# Patient Record
Sex: Female | Born: 1935
Health system: Southern US, Community
[De-identification: ages and names within clinical notes are randomized; demographics above are authoritative.]

## PROBLEM LIST (undated history)

## (undated) DIAGNOSIS — I080 Rheumatic disorders of both mitral and aortic valves: Secondary | ICD-10-CM

## (undated) DIAGNOSIS — M8580 Other specified disorders of bone density and structure, unspecified site: Secondary | ICD-10-CM

## (undated) DIAGNOSIS — E785 Hyperlipidemia, unspecified: Secondary | ICD-10-CM

## (undated) DIAGNOSIS — I2510 Atherosclerotic heart disease of native coronary artery without angina pectoris: Secondary | ICD-10-CM

## (undated) DIAGNOSIS — I7300 Raynaud's syndrome without gangrene: Secondary | ICD-10-CM

## (undated) DIAGNOSIS — A0472 Enterocolitis due to Clostridium difficile, not specified as recurrent: Secondary | ICD-10-CM

## (undated) DIAGNOSIS — I340 Nonrheumatic mitral (valve) insufficiency: Secondary | ICD-10-CM

## (undated) DIAGNOSIS — Z1211 Encounter for screening for malignant neoplasm of colon: Secondary | ICD-10-CM

## (undated) DIAGNOSIS — M810 Age-related osteoporosis without current pathological fracture: Secondary | ICD-10-CM

## (undated) HISTORY — DX: Other specified disorders of bone density and structure, unspecified site: M85.80

## (undated) HISTORY — PX: CORONARY STENT PLACEMENT: SHX1402

## (undated) HISTORY — PX: BREAST EXCISIONAL BIOPSY: SUR124

## (undated) HISTORY — DX: Atherosclerotic heart disease of native coronary artery without angina pectoris: I25.10

## (undated) HISTORY — DX: Rheumatic disorders of both mitral and aortic valves: I08.0

## (undated) HISTORY — DX: Raynaud's syndrome without gangrene: I73.00

## (undated) HISTORY — DX: Hyperlipidemia, unspecified: E78.5

## (undated) HISTORY — DX: Enterocolitis due to Clostridium difficile, not specified as recurrent: A04.72

## (undated) HISTORY — DX: Encounter for screening for malignant neoplasm of colon: Z12.11

## (undated) HISTORY — DX: Age-related osteoporosis without current pathological fracture: M81.0

## (undated) HISTORY — DX: Nonrheumatic mitral (valve) insufficiency: I34.0

---

## 1997-11-09 ENCOUNTER — Other Ambulatory Visit: Admission: RE | Admit: 1997-11-09 | Discharge: 1997-11-09 | Payer: Self-pay | Admitting: *Deleted

## 1998-12-27 ENCOUNTER — Ambulatory Visit (HOSPITAL_COMMUNITY): Admission: RE | Admit: 1998-12-27 | Discharge: 1998-12-27 | Payer: Self-pay | Admitting: Emergency Medicine

## 1999-11-20 ENCOUNTER — Encounter: Admission: RE | Admit: 1999-11-20 | Discharge: 1999-11-20 | Payer: Self-pay | Admitting: Emergency Medicine

## 1999-11-20 ENCOUNTER — Encounter: Payer: Self-pay | Admitting: Emergency Medicine

## 2000-03-18 ENCOUNTER — Encounter (INDEPENDENT_AMBULATORY_CARE_PROVIDER_SITE_OTHER): Payer: Self-pay

## 2000-03-18 ENCOUNTER — Other Ambulatory Visit: Admission: RE | Admit: 2000-03-18 | Discharge: 2000-03-18 | Payer: Self-pay | Admitting: Gynecology

## 2000-09-11 ENCOUNTER — Other Ambulatory Visit: Admission: RE | Admit: 2000-09-11 | Discharge: 2000-09-11 | Payer: Self-pay | Admitting: Gynecology

## 2000-11-20 ENCOUNTER — Encounter: Payer: Self-pay | Admitting: Gynecology

## 2000-11-20 ENCOUNTER — Encounter: Admission: RE | Admit: 2000-11-20 | Discharge: 2000-11-20 | Payer: Self-pay | Admitting: Gynecology

## 2001-09-17 ENCOUNTER — Other Ambulatory Visit: Admission: RE | Admit: 2001-09-17 | Discharge: 2001-09-17 | Payer: Self-pay | Admitting: Gynecology

## 2001-11-26 ENCOUNTER — Encounter: Payer: Self-pay | Admitting: Gynecology

## 2001-11-26 ENCOUNTER — Encounter: Admission: RE | Admit: 2001-11-26 | Discharge: 2001-11-26 | Payer: Self-pay | Admitting: Gynecology

## 2002-10-17 ENCOUNTER — Other Ambulatory Visit: Admission: RE | Admit: 2002-10-17 | Discharge: 2002-10-17 | Payer: Self-pay | Admitting: Gynecology

## 2003-01-13 ENCOUNTER — Encounter: Payer: Self-pay | Admitting: Gynecology

## 2003-01-13 ENCOUNTER — Encounter: Admission: RE | Admit: 2003-01-13 | Discharge: 2003-01-13 | Payer: Self-pay | Admitting: Gynecology

## 2003-10-02 ENCOUNTER — Encounter: Payer: Self-pay | Admitting: Internal Medicine

## 2003-10-03 ENCOUNTER — Ambulatory Visit (HOSPITAL_COMMUNITY): Admission: RE | Admit: 2003-10-03 | Discharge: 2003-10-04 | Payer: Self-pay | Admitting: Cardiology

## 2003-10-24 ENCOUNTER — Other Ambulatory Visit: Admission: RE | Admit: 2003-10-24 | Discharge: 2003-10-24 | Payer: Self-pay | Admitting: Gynecology

## 2004-02-29 ENCOUNTER — Ambulatory Visit: Payer: Self-pay | Admitting: Internal Medicine

## 2004-04-02 ENCOUNTER — Ambulatory Visit: Payer: Self-pay | Admitting: Cardiology

## 2004-04-04 ENCOUNTER — Ambulatory Visit: Payer: Self-pay | Admitting: Cardiology

## 2004-07-11 ENCOUNTER — Ambulatory Visit: Payer: Self-pay | Admitting: Cardiology

## 2004-07-22 ENCOUNTER — Ambulatory Visit: Payer: Self-pay | Admitting: Cardiology

## 2004-09-06 ENCOUNTER — Ambulatory Visit: Payer: Self-pay | Admitting: Cardiology

## 2004-10-01 ENCOUNTER — Encounter: Admission: RE | Admit: 2004-10-01 | Discharge: 2004-10-01 | Payer: Self-pay | Admitting: Otolaryngology

## 2004-10-07 ENCOUNTER — Ambulatory Visit: Payer: Self-pay | Admitting: Cardiology

## 2004-10-14 ENCOUNTER — Encounter: Admission: RE | Admit: 2004-10-14 | Discharge: 2004-10-14 | Payer: Self-pay | Admitting: Gynecology

## 2004-10-24 ENCOUNTER — Encounter: Admission: RE | Admit: 2004-10-24 | Discharge: 2004-10-24 | Payer: Self-pay | Admitting: Gynecology

## 2004-10-24 ENCOUNTER — Other Ambulatory Visit: Admission: RE | Admit: 2004-10-24 | Discharge: 2004-10-24 | Payer: Self-pay | Admitting: Gynecology

## 2004-11-29 ENCOUNTER — Encounter: Admission: RE | Admit: 2004-11-29 | Discharge: 2004-11-29 | Payer: Self-pay | Admitting: Gynecology

## 2004-12-03 ENCOUNTER — Ambulatory Visit (HOSPITAL_BASED_OUTPATIENT_CLINIC_OR_DEPARTMENT_OTHER): Admission: RE | Admit: 2004-12-03 | Discharge: 2004-12-03 | Payer: Self-pay | Admitting: Gynecology

## 2004-12-03 ENCOUNTER — Encounter (INDEPENDENT_AMBULATORY_CARE_PROVIDER_SITE_OTHER): Payer: Self-pay | Admitting: *Deleted

## 2004-12-03 ENCOUNTER — Ambulatory Visit (HOSPITAL_COMMUNITY): Admission: RE | Admit: 2004-12-03 | Discharge: 2004-12-03 | Payer: Self-pay | Admitting: Gynecology

## 2004-12-13 ENCOUNTER — Ambulatory Visit: Payer: Self-pay | Admitting: Cardiology

## 2005-02-26 ENCOUNTER — Ambulatory Visit: Payer: Self-pay | Admitting: Internal Medicine

## 2005-03-21 ENCOUNTER — Ambulatory Visit: Payer: Self-pay | Admitting: Cardiology

## 2005-07-01 ENCOUNTER — Ambulatory Visit: Payer: Self-pay | Admitting: Cardiology

## 2005-08-28 ENCOUNTER — Ambulatory Visit: Payer: Self-pay | Admitting: Internal Medicine

## 2005-10-28 ENCOUNTER — Encounter: Admission: RE | Admit: 2005-10-28 | Discharge: 2005-10-28 | Payer: Self-pay | Admitting: Gynecology

## 2006-02-03 ENCOUNTER — Ambulatory Visit: Payer: Self-pay | Admitting: Internal Medicine

## 2006-06-16 ENCOUNTER — Ambulatory Visit: Payer: Self-pay | Admitting: Cardiology

## 2006-07-01 ENCOUNTER — Ambulatory Visit: Payer: Self-pay | Admitting: Cardiology

## 2006-07-01 ENCOUNTER — Encounter: Payer: Self-pay | Admitting: Cardiology

## 2006-07-01 ENCOUNTER — Ambulatory Visit: Payer: Self-pay

## 2006-07-01 LAB — CONVERTED CEMR LAB
ALT: 23 units/L (ref 0–40)
AST: 33 units/L (ref 0–37)
Albumin: 3.9 g/dL (ref 3.5–5.2)
Alkaline Phosphatase: 49 units/L (ref 39–117)
Bilirubin, Direct: 0.1 mg/dL (ref 0.0–0.3)
Cholesterol: 147 mg/dL (ref 0–200)
HDL: 83.1 mg/dL (ref >39.0)
LDL Cholesterol: 53 mg/dL (ref 0–99)
Total Bilirubin: 0.8 mg/dL (ref 0.3–1.2)
Total CHOL/HDL Ratio: 1.8
Total Protein: 7.1 g/dL (ref 6.0–8.3)
Triglycerides: 55 mg/dL (ref 0–149)
VLDL: 11 mg/dL (ref 0–40)

## 2006-11-03 ENCOUNTER — Encounter: Admission: RE | Admit: 2006-11-03 | Discharge: 2006-11-03 | Payer: Self-pay | Admitting: Gynecology

## 2006-11-10 ENCOUNTER — Other Ambulatory Visit: Admission: RE | Admit: 2006-11-10 | Discharge: 2006-11-10 | Payer: Self-pay | Admitting: Gynecology

## 2006-12-15 ENCOUNTER — Ambulatory Visit: Payer: Self-pay | Admitting: Cardiology

## 2006-12-24 DIAGNOSIS — I7300 Raynaud's syndrome without gangrene: Secondary | ICD-10-CM | POA: Insufficient documentation

## 2006-12-24 DIAGNOSIS — E785 Hyperlipidemia, unspecified: Secondary | ICD-10-CM | POA: Insufficient documentation

## 2006-12-24 HISTORY — DX: Raynaud's syndrome without gangrene: I73.00

## 2006-12-24 HISTORY — DX: Hyperlipidemia, unspecified: E78.5

## 2007-08-26 ENCOUNTER — Ambulatory Visit: Payer: Self-pay | Admitting: Cardiology

## 2007-09-14 ENCOUNTER — Ambulatory Visit: Payer: Self-pay | Admitting: Cardiology

## 2007-09-14 LAB — CONVERTED CEMR LAB
ALT: 20 units/L (ref 0–35)
AST: 32 units/L (ref 0–37)
Albumin: 3.8 g/dL (ref 3.5–5.2)
Alkaline Phosphatase: 44 units/L (ref 39–117)
Bilirubin, Direct: 0.1 mg/dL (ref 0.0–0.3)
Cholesterol: 141 mg/dL (ref 0–200)
HDL: 79.2 mg/dL (ref >39.0)
LDL Cholesterol: 50 mg/dL (ref 0–99)
Total Bilirubin: 0.9 mg/dL (ref 0.3–1.2)
Total CHOL/HDL Ratio: 1.8
Total Protein: 7 g/dL (ref 6.0–8.3)
Triglycerides: 59 mg/dL (ref 0–149)
VLDL: 12 mg/dL (ref 0–40)

## 2007-11-09 ENCOUNTER — Encounter: Admission: RE | Admit: 2007-11-09 | Discharge: 2007-11-09 | Payer: Self-pay | Admitting: Gynecology

## 2007-12-24 ENCOUNTER — Encounter: Payer: Self-pay | Admitting: Internal Medicine

## 2008-01-26 ENCOUNTER — Ambulatory Visit: Payer: Self-pay | Admitting: Internal Medicine

## 2008-02-10 ENCOUNTER — Ambulatory Visit: Payer: Self-pay | Admitting: Internal Medicine

## 2008-04-14 LAB — CONVERTED CEMR LAB: Pap Smear: NORMAL

## 2008-08-11 ENCOUNTER — Encounter: Payer: Self-pay | Admitting: Internal Medicine

## 2008-08-12 DIAGNOSIS — I2510 Atherosclerotic heart disease of native coronary artery without angina pectoris: Secondary | ICD-10-CM | POA: Insufficient documentation

## 2008-08-12 DIAGNOSIS — I080 Rheumatic disorders of both mitral and aortic valves: Secondary | ICD-10-CM

## 2008-08-12 HISTORY — DX: Rheumatic disorders of both mitral and aortic valves: I08.0

## 2008-08-22 ENCOUNTER — Ambulatory Visit: Payer: Self-pay | Admitting: Cardiology

## 2008-08-28 ENCOUNTER — Ambulatory Visit: Payer: Self-pay | Admitting: Cardiology

## 2008-08-29 LAB — CONVERTED CEMR LAB
ALT: 16 units/L (ref 0–35)
AST: 34 units/L (ref 0–37)
Albumin: 4.1 g/dL (ref 3.5–5.2)
Alkaline Phosphatase: 45 units/L (ref 39–117)
Bilirubin, Direct: 0 mg/dL (ref 0.0–0.3)
Cholesterol: 151 mg/dL (ref 0–200)
HDL: 93.2 mg/dL (ref >39.00)
LDL Cholesterol: 44 mg/dL (ref 0–99)
Total Bilirubin: 0.9 mg/dL (ref 0.3–1.2)
Total CHOL/HDL Ratio: 2
Total CK: 182 units/L — ABNORMAL HIGH (ref 7–177)
Total Protein: 7.3 g/dL (ref 6.0–8.3)
Triglycerides: 71 mg/dL (ref 0.0–149.0)
VLDL: 14.2 mg/dL (ref 0.0–40.0)

## 2008-08-31 ENCOUNTER — Ambulatory Visit: Payer: Self-pay | Admitting: Internal Medicine

## 2008-09-01 ENCOUNTER — Telehealth: Payer: Self-pay | Admitting: Cardiology

## 2008-09-20 ENCOUNTER — Ambulatory Visit: Payer: Self-pay | Admitting: Internal Medicine

## 2008-09-21 LAB — CONVERTED CEMR LAB
BUN: 15 mg/dL (ref 6–23)
Basophils Absolute: 0.1 10*3/uL (ref 0.0–0.1)
Basophils Relative: 0.6 % (ref 0.0–3.0)
CO2: 30 meq/L (ref 19–32)
Calcium: 9.8 mg/dL (ref 8.4–10.5)
Chloride: 110 meq/L (ref 96–112)
Creatinine, Ser: 0.8 mg/dL (ref 0.4–1.2)
Eosinophils Absolute: 0.1 10*3/uL (ref 0.0–0.7)
Eosinophils Relative: 1.3 % (ref 0.0–5.0)
GFR calc non Af Amer: 74.48 mL/min (ref >60)
Glucose, Bld: 84 mg/dL (ref 70–99)
HCT: 38.5 % (ref 36.0–46.0)
Hemoglobin: 13.3 g/dL (ref 12.0–15.0)
Lymphocytes Relative: 23.9 % (ref 12.0–46.0)
Lymphs Abs: 2.1 10*3/uL (ref 0.7–4.0)
MCHC: 34.6 g/dL (ref 30.0–36.0)
MCV: 90.7 fL (ref 78.0–100.0)
Monocytes Absolute: 0.7 10*3/uL (ref 0.1–1.0)
Monocytes Relative: 8 % (ref 3.0–12.0)
Neutro Abs: 5.6 10*3/uL (ref 1.4–7.7)
Neutrophils Relative %: 66.2 % (ref 43.0–77.0)
Platelets: 294 10*3/uL (ref 150.0–400.0)
Potassium: 4.1 meq/L (ref 3.5–5.1)
RBC: 4.25 M/uL (ref 3.87–5.11)
RDW: 12.6 % (ref 11.5–14.6)
Sodium: 144 meq/L (ref 135–145)
TSH: 1.23 microintl units/mL (ref 0.35–5.50)
Total CK: 75 units/L (ref 7–177)
WBC: 8.6 10*3/uL (ref 4.5–10.5)

## 2008-10-12 ENCOUNTER — Ambulatory Visit: Payer: Self-pay | Admitting: Internal Medicine

## 2008-11-16 ENCOUNTER — Encounter: Admission: RE | Admit: 2008-11-16 | Discharge: 2008-11-16 | Payer: Self-pay | Admitting: Gynecology

## 2009-02-06 ENCOUNTER — Ambulatory Visit: Payer: Self-pay | Admitting: Internal Medicine

## 2009-08-14 ENCOUNTER — Ambulatory Visit: Payer: Self-pay | Admitting: Cardiology

## 2009-08-28 LAB — CONVERTED CEMR LAB
ALT: 16 units/L (ref 0–35)
AST: 29 units/L (ref 0–37)
Albumin: 4 g/dL (ref 3.5–5.2)
Alkaline Phosphatase: 46 units/L (ref 39–117)
Bilirubin, Direct: 0.1 mg/dL (ref 0.0–0.3)
Cholesterol: 141 mg/dL (ref 0–200)
HDL: 76.1 mg/dL (ref >39.00)
LDL Cholesterol: 52 mg/dL (ref 0–99)
Total Bilirubin: 0.7 mg/dL (ref 0.3–1.2)
Total CHOL/HDL Ratio: 2
Total Protein: 7.1 g/dL (ref 6.0–8.3)
Triglycerides: 65 mg/dL (ref 0.0–149.0)
VLDL: 13 mg/dL (ref 0.0–40.0)

## 2009-11-21 ENCOUNTER — Encounter: Admission: RE | Admit: 2009-11-21 | Discharge: 2009-11-21 | Payer: Self-pay | Admitting: Gynecology

## 2010-01-02 ENCOUNTER — Encounter: Payer: Self-pay | Admitting: Internal Medicine

## 2010-01-04 ENCOUNTER — Ambulatory Visit: Payer: Self-pay | Admitting: Internal Medicine

## 2010-05-16 NOTE — Assessment & Plan Note (Signed)
  Nurse Visit  Flu Vaccine Consent Questions     Do you have a history of severe allergic reactions to this vaccine? no    Any prior history of allergic reactions to egg and/or gelatin? no    Do you have a sensitivity to the preservative Thimersol? no    Do you have a past history of Guillan-Barre Syndrome? no    Do you currently have an acute febrile illness? no    Have you ever had a severe reaction to latex? no    Vaccine information given and explained to patient? yes    Are you currently pregnant? no    Lot Number:[id number]   Site Given  Left Deltoid IM Romualdo Bolk, CMA  January 26, 2008 12:55 PM   Prior Medications: BACTROBAN 2 % OINT (MUPIROCIN) Apply 1 a small amount to affected area twice a day VYTORIN 10-40 MG TABS (EZETIMIBE-SIMVASTATIN) Take 1 tablet by mouth once a day VIVELLE-DOT 0.025 MG/24HR  PTTW (ESTRADIOL) change 2X weekly PROMETRIUM 200 MG  CAPS (PROGESTERONE MICRONIZED) 1st 12 days of every other month ACTONEL 35 MG  TABS (RISEDRONATE SODIUM) weekly MULTIVITAMINS   TABS (MULTIPLE VITAMIN) once daily ASPIRIN 81 MG  TBEC (ASPIRIN) once daily CALTRATE 600+D 600-400 MG-UNIT  TABS (CALCIUM CARBONATE-VITAMIN D)  Current Allergies: ! CODEINE SULFATE (CODEINE SULFATE)    Orders Added: 1)  Admin 1st Vaccine [90471] 2)  Flu Vaccine 103yrs + [place].Dew    ]

## 2010-05-16 NOTE — Assessment & Plan Note (Signed)
Summary: PT WAS NOT PUT ON SCHEDULE//SLM    Chief Complaint:  acute complaint.    Current Allergies: ! CODEINE SULFATE (CODEINE SULFATE)        Impression & Recommendations:  Problem # 1:  RAYNAUD'S DISEASE (ICD-443.0) discussed at length amlodipine side effects discussed  10 minutes total time  Complete Medication List: 1)  Bactroban 2 % Oint (Mupirocin) .... Apply 1 a small amount to affected area twice a day 2)  Vytorin 10-40 Mg Tabs (Ezetimibe-simvastatin) .... Take 1 tablet by mouth once a day 3)  Vivelle-dot 0.025 Mg/24hr Pttw (Estradiol) .... Change 2x weekly 4)  Prometrium 200 Mg Caps (Progesterone micronized) .Marland Kitchen.. 1st 12 days of every other month 5)  Actonel 35 Mg Tabs (Risedronate sodium) .... Weekly 6)  Multivitamins Tabs (Multiple vitamin) .... Once daily 7)  Aspirin 81 Mg Tbec (Aspirin) .... Once daily 8)  Caltrate 600+d 600-400 Mg-unit Tabs (Calcium carbonate-vitamin d) 9)  Amlodipine Besylate 5 Mg Tabs (Amlodipine besylate) .Marland Kitchen.. 1 by mouth every day    ]

## 2010-05-16 NOTE — Procedures (Signed)
Summary: Colonoscopy   Colonoscopy  Procedure date:  10/12/2008  Findings:      [facility]    COLONOSCOPY PROCEDURE REPORT  PATIENT:  Jennifer Donaldson, Jennifer Donaldson  MR#:  [id number] BIRTHDATE:   1932-06-19, 76 yrs. old   GENDER:   female  ENDOSCOPIST:   Wilhemina Bonito. Eda Keys, MD Referred by: Birdie Sons, M.D.  PROCEDURE DATE:  10/12/2008 PROCEDURE:  Average-risk screening colonoscopy ASA CLASS:   Class II INDICATIONS: Routine Risk Screening   MEDICATIONS:    Fentanyl 50 mcg IV, Versed 5 mg IV  DESCRIPTION OF PROCEDURE:   After the risks benefits and alternatives of the procedure were thoroughly explained, informed consent was obtained.  Digital rectal exam was performed and revealed no abnormalities.   The LB [id number] endoscope was introduced through the anus and advanced to the cecum, which was identified by both the appendix and ileocecal valve, without limitations. TIME TO CECUM = 3:50MIN The quality of the prep was excellent, using MoviPrep.  The instrument was then withdrawn (TIME = 6:33 MIN) as the colon was fully examined. <<PROCEDUREIMAGES>>        <<OLD IMAGES>>  FINDINGS:  A normal appearing cecum, ileocecal valve, and appendiceal orifice were identified. The ascending, hepatic flexure, transverse, splenic flexure, descending, sigmoid colon, and rectum appeared unremarkable.  No polyps or cancers were seen.   Retroflexed views in the rectum revealed no abnormalities.    The scope was then withdrawn from the patient and the procedure completed.  COMPLICATIONS:   None  ENDOSCOPIC IMPRESSION:  1) Normal colon  2) No polyps or cancers RECOMMENDATIONS:  1) Return to the care of your primary provider. GI follow up as needed   _______________________________ Wilhemina Bonito. Eda Keys, MD  CC: Lindley Magnus, MD;  The Patient

## 2010-05-16 NOTE — Assessment & Plan Note (Signed)
Summary: Jennifer Donaldson    Visit Type:  1 year follpow up  CC:  No cardiac complains.  History of Present Illness: Able to do yardwork without difficulty.    She is able to do most anything.  Her husband has NPH and CABG, so she has to do alot of things for him  She does not mow grass anymore.  She denies dyspnea.    Current Medications (verified): 1)  Vytorin 10-40 Mg Tabs (Ezetimibe-Simvastatin) .... Take 1 Tablet By Mouth Once A Day 2)  Vivelle-Dot 0.025 Mg/24hr  Pttw (Estradiol) .... Change 2x Weekly 3)  Prometrium 200 Mg  Caps (Progesterone Micronized) .Marland Kitchen.. 1st 12 Days of Every 4th Month 4)  Aspirin 81 Mg  Tbec (Aspirin) .... Once Daily 5)  Citracal Petites/vitamin D 200-250 Mg-Unit Tabs (Calcium Citrate-Vitamin D) .... Two Tablets By Mouth Twice A Day 6)  Amlodipine Besylate 5 Mg  Tabs (Amlodipine Besylate) .Marland Kitchen.. 1 By Mouth Every Day 7)  Vitamin D3 1000 Unit Tabs (Cholecalciferol) .... Take 1 Tablet By Mouth Once A Day 8)  Clobetasol Propionate 0.05 % Crea (Clobetasol Propionate) .... Twice A Week 9)  Estrace 0.1 Mg/gm Crea (Estradiol) .... Use Twice Weekly As Directed  Allergies: 1)  ! Codeine Sulfate (Codeine Sulfate)  Vital Signs:  Patient profile:   77 year old female Height:      59 inches Weight:      108 pounds BMI:     21.89 Pulse rate:   66 / minute Pulse rhythm:   regular Resp:     18 per minute BP sitting:   122 / 70  (left arm) Cuff size:   regular  Vitals Entered By: Vikki Ports (Aug 14, 2009 9:13 AM)  Physical Exam  General:  Well developed, well nourished, in no acute distress. Head:  normocephalic and atraumatic Eyes:  PERRLA/EOM intact; conjunctiva and lids normal. Lungs:  Clear bilaterally to auscultation and percussion. Heart:  Non-displaced PMI, chest non-tender; regular rate and rhythm, S1, S2.  No definite murmur appreciated.   Msk:  Back normal, normal gait. Muscle strength and tone normal. Extremities:  No clubbing or cyanosis.  No edema.     EKG  Procedure date:  08/14/2009  Findings:      NSR.  WNL.    Impression & Recommendations:  Problem # 1:  CAD (ICD-414.00)  very stable.  No chest pain or jaw pain, her original symptom.  Continue medication.   Her updated medication list for this problem includes:    Aspirin 81 Mg Tbec (Aspirin) ..... Once daily    Amlodipine Besylate 5 Mg Tabs (Amlodipine besylate) .Marland Kitchen... 1 by mouth every day  Her updated medication list for this problem includes:    Aspirin 81 Mg Tbec (Aspirin) ..... Once daily    Amlodipine Besylate 5 Mg Tabs (Amlodipine besylate) .Marland Kitchen... 1 by mouth every day  Orders: EKG w/ Interpretation (93000)  Problem # 2:  HYPERLIPIDEMIA (ICD-272.4)  lipid and liver profile Her updated medication list for this problem includes:    Vytorin 10-40 Mg Tabs (Ezetimibe-simvastatin) .Marland Kitchen... Take 1 tablet by mouth once a day  Her updated medication list for this problem includes:    Vytorin 10-40 Mg Tabs (Ezetimibe-simvastatin) .Marland Kitchen... Take 1 tablet by mouth once a day  Orders: TLB-Lipid Panel (80061-LIPID) TLB-Hepatic/Liver Function Pnl (80076-HEPATIC)  Problem # 3:  MITRAL REGURGITATION (ICD-396.3) Not much of a murmur.  Echo was not scheduled  today.  Doubt we need to do this at present.  No symptoms.    Patient Instructions: 1)  Your physician recommends that you schedule a follow-up appointment in: 1 year with Dr. Riley Kill 2)  Your physician recommends that you continue on your current medications as directed. Please refer to the Current Medication list given to you today. 3)  Your physician recommends that you have lab work today: lipid, Liver 4)  Your physician has requested that you have an echocardiogram.  Echocardiography is a painless test that uses sound waves to create images of your heart. It provides your doctor with information about the size and shape of your heart and how well your heart's chambers and valves are working.  This procedure takes  approximately one hour. There are no restrictions for this procedure. ECHO SAME DAY AS 1 YEAR FOLLOW UP

## 2010-05-16 NOTE — Assessment & Plan Note (Signed)
Summary: F1Y  Medications Added PROMETRIUM 200 MG  CAPS (PROGESTERONE MICRONIZED) 1st 12 days of every 4th month CITRACAL PETITES/VITAMIN D 200-250 MG-UNIT TABS (CALCIUM CITRATE-VITAMIN D) two tablets by mouth twice a day VITAMIN D3 1000 UNIT TABS (CHOLECALCIFEROL) Take 1 tablet by mouth once a day CLOBETASOL PROPIONATE 0.05 % CREA (CLOBETASOL PROPIONATE) twice a week        History of Present Illness: No chest pain.  Dr. Cato Mulligan prescribed amlodipine, and it has helped.  Does have arthritis in the fingers as well.  No other symptoms.    Current Medications (verified): 1)  Vytorin 10-40 Mg Tabs (Ezetimibe-Simvastatin) .... Take 1 Tablet By Mouth Once A Day 2)  Vivelle-Dot 0.025 Mg/24hr  Pttw (Estradiol) .... Change 2x Weekly 3)  Prometrium 200 Mg  Caps (Progesterone Micronized) .Marland Kitchen.. 1st 12 Days of Every 4th Month 4)  Aspirin 81 Mg  Tbec (Aspirin) .... Once Daily 5)  Citracal Petites/vitamin D 200-250 Mg-Unit Tabs (Calcium Citrate-Vitamin D) .... Two Tablets By Mouth Twice A Day 6)  Amlodipine Besylate 5 Mg  Tabs (Amlodipine Besylate) .Marland Kitchen.. 1 By Mouth Every Day 7)  Vitamin D3 1000 Unit Tabs (Cholecalciferol) .... Take 1 Tablet By Mouth Once A Day 8)  Clobetasol Propionate 0.05 % Crea (Clobetasol Propionate) .... Twice A Week  Allergies: 1)  ! Codeine Sulfate (Codeine Sulfate) d Vital Signs:  Patient profile:   77 year old female Height:      60 inches Weight:      111.75 pounds BMI:     21.90 Pulse rate:   76 / minute BP sitting:   120 / 64  (left arm)  Vitals Entered By: Julieta Gutting, RN, BSN (Aug 22, 2008 2:43 PM)  Physical Exam  General:  Well developed, well nourished, in no acute distress. Lungs:  Clear bilaterally to auscultation and percussion. Heart:  Non-displaced PMI, chest non-tender; regular rate and rhythm, S1, S2 without murmurs, rubs or gallops. Carotid upstroke normal, no bruit. Normal abdominal aortic size, no bruits. Femorals normal pulses, no bruits. Pedals  normal pulses. No edema, no varicosities. Extremities:  No edema.  Some stiffening of fingers.     EKG  Procedure date:  08/22/2008  Findings:      NSR.  WNL.  Impression & Recommendations:  Problem # 1:  CAD (ICD-414.00)  No significant symptoms.  Pt should take ASA for colonoscopy. r updated medication list for this problem includes:    Aspirin 81 Mg Tbec (Aspirin) ..... Once daily    Amlodipine Besylate 5 Mg Tabs (Amlodipine besylate) .Marland Kitchen... 1 by mouth every day  Orders: EKG w/ Interpretation (93000)  Problem # 2:  HYPERLIPIDEMIA (ICD-272.4) Recheck lipids and CPK.  Meds reviewed in detail, including Zetia studies.  LDL lower on Vytorin than Simva 40.  Options reviewed. Her updated medication list for this problem includes:    Vytorin 10-40 Mg Tabs (Ezetimibe-simvastatin) .Marland Kitchen... Take 1 tablet by mouth once a day  Problem # 3:  MITRAL REGURGITATION (ICD-396.3)  No change in murmur  Orders: EKG w/ Interpretation (93000)  Patient Instructions: 1)  Followup with Dr. Cato Mulligan, with discussion of arthritis and Raynauds. 2)  Your physician recommends that you return for a FASTING lipid profile: LIPID, LIVER, CPK (08/28/08 8:30-1:30 or 2:30-4:45) 3)  Your physician wants you to follow-up in:  1 year with echo. You will receive a reminder letter in the mail two months in advance. If you don't receive a letter, please call our office to schedule the follow-up  appointment.

## 2010-05-16 NOTE — Assessment & Plan Note (Signed)
Summary: flu shot/njr  Nurse Visit   Allergies: 1)  ! Codeine Sulfate (Codeine Sulfate)  Immunizations Administered:  Influenza Vaccine # 1:    Vaccine Type: Fluvax MCR    Site: left deltoid    Mfr: [employer]    Dose: 0.5 ml    Route: IM    Given by: Kathrynn Speed CMA    Exp. Date: 10/12/2010    Lot #: [id number]    VIS given: 11/06/09 version given January 04, 2010.  Flu Vaccine Consent Questions:    Do you have a history of severe allergic reactions to this vaccine? no    Any prior history of allergic reactions to egg and/or gelatin? no    Do you have a sensitivity to the preservative Thimersol? no    Do you have a past history of Guillan-Barre Syndrome? no    Do you currently have an acute febrile illness? no    Have you ever had a severe reaction to latex? no    Vaccine information given and explained to patient? yes    Are you currently pregnant? no  Orders Added: 1)  Influenza Vaccine MCR [00025]

## 2010-05-16 NOTE — Progress Notes (Signed)
Summary: pt would like test results   Phone Note Call from Patient Call back at Home Phone [phone]   Reason for Call: Talk to Nurse, Lab or Test Results Summary of Call: pt has tried to get lab results all week she will be home for next couple of hours can you pls call her back Initial call taken by: Omer Jack,  Sep 01, 2008 11:27 AM  Follow-up for Phone Call        talked with patient -gave patient lab results from 08/28/08 -pt has appt September 20 2008 with Dr Cato Mulligan

## 2010-05-16 NOTE — Consult Note (Signed)
Summary: Gynecology-Dr. Nicholas Lose  Gynecology-Dr. Lomax   Imported By: Maryln Gottron 01/04/2008 14:17:06  _____________________________________________________________________  External Attachment:    Type:   Image     Comment:   External Document

## 2010-05-16 NOTE — Assessment & Plan Note (Signed)
Summary: flu shot/cjr  Nurse Visit Flu Vaccine Consent Questions     Do you have a history of severe allergic reactions to this vaccine? no    Any prior history of allergic reactions to egg and/or gelatin? no    Do you have a sensitivity to the preservative Thimersol? no    Do you have a past history of Guillan-Barre Syndrome? no    Do you currently have an acute febrile illness? no    Have you ever had a severe reaction to latex? no    Vaccine information given and explained to patient? yes    Are you currently pregnant? no    Lot Number:[id number]   Exp Date:10/11/2009   Site Given  Left Deltoid IM   Allergies: 1)  ! Codeine Sulfate (Codeine Sulfate)  Orders Added: 1)  Admin 1st Vaccine [90471] 2)  Flu Vaccine 32yrs + [09811]

## 2010-05-16 NOTE — Assessment & Plan Note (Signed)
    Chief Complaint:  raynoud's.  History of Present Illness: Raynaud's worsening-- pt comes in with husband she asks to be seen worsening raynaud's with pain  Past Medical History: Hyperlipidemia, hx of Osteopenia Raynauds Past Surgical History: no other complaints in a complete ROS     Current Allergies: ! CODEINE SULFATE (CODEINE SULFATE)   Social History:    Married    Never Smoked   Risk Factors:  Tobacco use:  never    Physical Exam  General:     Well-developed,well-nourished,in no acute distress; alert,appropriate and cooperative throughout examination Head:     normocephalic and atraumatic.   Neck:     No deformities, masses, or tenderness noted. Lungs:     Normal respiratory effort, chest expands symmetrically. Lungs are clear to auscultation, no crackles or wheezes. Heart:     Normal rate and regular rhythm. S1 and S2 normal without gallop, murmur, click, rub or other extra sounds.    Impression & Recommendations:  Problem # 1:  RAYNAUD'S DISEASE (ICD-443.0) discussed at length trial norvasc  side effects discussed  Complete Medication List: 1)  Bactroban 2 % Oint (Mupirocin) .... Apply 1 a small amount to affected area twice a day 2)  Vytorin 10-40 Mg Tabs (Ezetimibe-simvastatin) .... Take 1 tablet by mouth once a day 3)  Vivelle-dot 0.025 Mg/24hr Pttw (Estradiol) .... Change 2x weekly 4)  Prometrium 200 Mg Caps (Progesterone micronized) .Marland Kitchen.. 1st 12 days of every other month 5)  Actonel 35 Mg Tabs (Risedronate sodium) .... Weekly 6)  Multivitamins Tabs (Multiple vitamin) .... Once daily 7)  Aspirin 81 Mg Tbec (Aspirin) .... Once daily 8)  Caltrate 600+d 600-400 Mg-unit Tabs (Calcium carbonate-vitamin d) 9)  Amlodipine Besylate 5 Mg Tabs (Amlodipine besylate) .Marland Kitchen.. 1 by mouth every day    Prescriptions: AMLODIPINE BESYLATE 5 MG  TABS (AMLODIPINE BESYLATE) 1 by mouth every day  #90 x 3   Entered and Authorized by:   Birdie Sons MD  Signed by:   Birdie Sons MD on 02/10/2008   Method used:   Electronically to        [facility] [address] [phone]* (retail)       [address]       [place], [place]  [address]       Ph: [phone] or [phone]       Fax: [phone]   RxID:   [id number]  ]

## 2010-05-16 NOTE — Assessment & Plan Note (Signed)
Summary: CPX/WILL FAST FOR LABS//SLM   Vital Signs:  Patient profile:   77 year old female Height:      59 inches Weight:      108 pounds BMI:     21.89 Pulse rate:   72 / minute Pulse rhythm:   regular Resp:     12 per minute BP sitting:   110 / 72  (left arm)  Vitals Entered By: Gladis Riffle, RN (September 20, 2008 10:03 AM)  History of Present Illness: cpx  Current Problems (verified): 1)  Encounter For Long-term Use of Other Medications  (ICD-V58.69) 2)  Mitral Regurgitation  (ICD-396.3) 3)  Cad  (ICD-414.00) 4)  Special Screening For Malignant Neoplasms Colon  (ICD-V76.51) 5)  Raynaud's Disease  (ICD-443.0) 6)  Osteopenia  (ICD-733.90) 7)  Hyperlipidemia  (ICD-272.4)  Current Medications (verified): 1)  Vytorin 10-40 Mg Tabs (Ezetimibe-Simvastatin) .... Take 1 Tablet By Mouth Once A Day 2)  Vivelle-Dot 0.025 Mg/24hr  Pttw (Estradiol) .... Change 2x Weekly 3)  Prometrium 200 Mg  Caps (Progesterone Micronized) .Marland Kitchen.. 1st 12 Days of Every 4th Month 4)  Aspirin 81 Mg  Tbec (Aspirin) .... Once Daily 5)  Citracal Petites/vitamin D 200-250 Mg-Unit Tabs (Calcium Citrate-Vitamin D) .... Two Tablets By Mouth Twice A Day 6)  Amlodipine Besylate 5 Mg  Tabs (Amlodipine Besylate) .Marland Kitchen.. 1 By Mouth Every Day 7)  Vitamin D3 1000 Unit Tabs (Cholecalciferol) .... Take 1 Tablet By Mouth Once A Day 8)  Clobetasol Propionate 0.05 % Crea (Clobetasol Propionate) .... Twice A Week 9)  Moviprep 100 Gm  Solr (Peg-Kcl-Nacl-Nasulf-Na Asc-C) .... As Per Prep Instructions. 10)  Estrace 0.1 Mg/gm Crea (Estradiol) .... Use Twice Weekly As Directed  Allergies: 1)  ! Codeine Sulfate (Codeine Sulfate)  Comments:  Nurse/Medical Assistant: cpx, fasting; has gyn--Dr Riley Kill requests labs and was to have sent you a note The patient's medications and allergies were reviewed with the patient and were updated in the Medication and Allergy Lists. Gladis Riffle, RN (September 20, 2008 10:08 AM)  Past History:  Past  Medical History: Last updated: 08/12/2008 Current Problems:  MITRAL REGURGITATION (ICD-396.3) CAD (ICD-414.00) SPECIAL SCREENING FOR MALIGNANT NEOPLASMS COLON (ICD-V76.51) RAYNAUD'S DISEASE (ICD-443.0) OSTEOPENIA (ICD-733.90) HYPERLIPIDEMIA (ICD-272.4)  Past Surgical History: Last updated: 08/12/2008  2 prior drug-eluting  stent placed in the LAD and right coronary artery  Family History: Last updated: 08/12/2008 noncontributory  Social History: Last updated: 02/10/2008 Married Never Smoked  Risk Factors: Smoking Status: never (02/10/2008)  Review of Systems       All other systems reviewed and were negative    Impression & Recommendations:  Problem # 1:  PREVENTIVE HEALTH CARE (ICD-V70.0)  Orders: UA Dipstick w/o Micro (automated)  (81003) Venipuncture (16109) TLB-BMP (Basic Metabolic Panel-BMET) (80048-METABOL) TLB-CBC Platelet - w/Differential (85025-CBCD) TLB-TSH (Thyroid Stimulating Hormone) (84443-TSH)  she has colonoscopy scheduled 10/12/08  Problem # 2:  CAD (ICD-414.00) no sxs  reviewed labs Her updated medication list for this problem includes:    Aspirin 81 Mg Tbec (Aspirin) ..... Once daily    Amlodipine Besylate 5 Mg Tabs (Amlodipine besylate) .Marland Kitchen... 1 by mouth every day  Labs Reviewed: Chol: 151 (08/28/2008)   HDL: 93.20 (08/28/2008)   LDL: 44 (08/28/2008)   TG: 71.0 (08/28/2008)  Problem # 3:  MITRAL REGURGITATION (ICD-396.3) mild and asymptomatic clinically Her updated medication list for this problem includes:    Aspirin 81 Mg Tbec (Aspirin) ..... Once daily  Problem # 4:  HYPERLIPIDEMIA (ICD-272.4)  Her updated  medication list for this problem includes:    Vytorin 10-40 Mg Tabs (Ezetimibe-simvastatin) .Marland Kitchen... Take 1 tablet by mouth once a day  Labs Reviewed: SGOT: 34 (08/28/2008)   SGPT: 16 (08/28/2008)   HDL:93.20 (08/28/2008), 79.2 (09/14/2007)  LDL:44 (08/28/2008), 50 (09/14/2007)  Chol:151 (08/28/2008), 141 (09/14/2007)  Trig:71.0  (08/28/2008), 59 (09/14/2007)  Orders: TLB-CK Total Only(Creatine Kinase/CPK) (82550-CK)  Complete Medication List: 1)  Vytorin 10-40 Mg Tabs (Ezetimibe-simvastatin) .... Take 1 tablet by mouth once a day 2)  Vivelle-dot 0.025 Mg/24hr Pttw (Estradiol) .... Change 2x weekly 3)  Prometrium 200 Mg Caps (Progesterone micronized) .Marland Kitchen.. 1st 12 days of every 4th month 4)  Aspirin 81 Mg Tbec (Aspirin) .... Once daily 5)  Citracal Petites/vitamin D 200-250 Mg-unit Tabs (Calcium citrate-vitamin d) .... Two tablets by mouth twice a day 6)  Amlodipine Besylate 5 Mg Tabs (Amlodipine besylate) .Marland Kitchen.. 1 by mouth every day 7)  Vitamin D3 1000 Unit Tabs (Cholecalciferol) .... Take 1 tablet by mouth once a day 8)  Clobetasol Propionate 0.05 % Crea (Clobetasol propionate) .... Twice a week 9)  Moviprep 100 Gm Solr (Peg-kcl-nacl-nasulf-na asc-c) .... As per prep instructions. 10)  Estrace 0.1 Mg/gm Crea (Estradiol) .... Use twice weekly as directed  Other Orders: Tetanus Toxoid w/Dx [phone]) Admin 1st Vaccine (52841)  Preventive Care Screening  Bone Density:    Date:  04/15/2007    Next Due:  04/2009    Results:  lomax std dev  Mammogram:    Date:  04/14/2008    Next Due:  04/2009    Results:  normal-pt's report   Pap Smear:    Date:  04/14/2008    Next Due:  04/2011    Results:  normal-pt's report   Last Tetanus Booster:    Date:  09/20/2008    Results:  Td    Preventive Care Screening  Bone Density:    Date:  04/15/2007    Next Due:  04/2009    Results:  lomax std dev  Mammogram:    Date:  04/14/2008    Next Due:  04/2009    Results:  normal-pt's report   Pap Smear:    Date:  04/14/2008    Next Due:  04/2011    Results:  normal-pt's report   Last Tetanus Booster:    Date:  09/20/2008    Results:  Td    Other Immunization History:    Zostavax # 1:  Zostavax (04/15/2007)  Tetanus/Td Vaccine    Vaccine Type: Td    Site: right deltoid    Mfr: Sanofi Pasteur    Dose:  0.5 ml    Route: IM    Given by: Gladis Riffle, RN    Exp. Date: 05/11/2010    Lot #: [id number] Physical Exam General Appearance: well developed, well nourished, no acute distress Eyes: conjunctiva and lids normal, PERRL, EOMI,  Ears, Nose, Mouth, Throat: TM clear, nares clear, oral exam WNL Neck: supple, no lymphadenopathy, no thyromegaly, no JVD Respiratory: clear to auscultation and percussion, respiratory effort normal Cardiovascular: regular rate and rhythm, S1-S2, no murmur, rub or gallop, no bruits, peripheral pulses normal and symmetric, no cyanosis, clubbing, edema or varicosities Chest: no scars, masses, tenderness; no asymmetry, skin changes, nipple discharge   Gastrointestinal: soft, non-tender; no hepatosplenomegaly, masses; active bowel sounds all quadrants, Lymphatic: no cervical, axillary or inguinal adenopathy Musculoskeletal: gait normal, muscle tone and strength WNL, no joint swelling, effusions, discoloration, crepitus  Skin: clear, good turgor, color WNL, no rashes, lesions, or  ulcerations Neurologic: normal mental status, normal reflexes, normal strength, sensation, and motion Psychiatric: alert; oriented to person, place and time Other Exam:   Appended Document: CPX/WILL FAST FOR LABS//SLM  Laboratory Results   Urine Tests    Routine Urinalysis   Color: yellow Appearance: Clear Glucose: negative   (Normal Range: Negative) Bilirubin: negative   (Normal Range: Negative) Ketone: 1+   (Normal Range: Negative) Spec. Gravity: 1.020   (Normal Range: 1.003-1.035) Blood: negative   (Normal Range: Negative) pH: 6.0   (Normal Range: 5.0-8.0) Protein: negative   (Normal Range: Negative) Urobilinogen: 0.2   (Normal Range: 0-1) Nitrite: negative   (Normal Range: Negative) Leukocyte Esterace: trace   (Normal Range: Negative)    Comments: Rita Ohara  September 20, 2008 12:48 PM

## 2010-05-16 NOTE — Letter (Signed)
Summary: Annual Exam-Dr. Beather Arbour  Annual Exam-Dr. Beather Arbour   Imported By: Maryln Gottron 01/17/2010 13:33:43  _____________________________________________________________________  External Attachment:    Type:   Image     Comment:   External Document

## 2010-05-16 NOTE — Miscellaneous (Signed)
Summary: [facility] Previsit/prep  Clinical Lists Changes  Medications: Added new medication of MOVIPREP 100 GM  SOLR (PEG-KCL-NACL-NASULF-NA ASC-C) As per prep instructions. - Signed Rx of MOVIPREP 100 GM  SOLR (PEG-KCL-NACL-NASULF-NA ASC-C) As per prep instructions.;  #1 x 0;  Signed;  Entered by: Wyona Almas RN;  Authorized by: Hilarie Fredrickson MD;  Method used: [facility] [address] [phone]*, [address] [place], [place], [place]  [address], Ph: [phone] or [phone], Fax: [phone] Observations: Added new observation of ALLERGY REV: Done (08/31/2008 8:41)    Prescriptions: MOVIPREP 100 GM  SOLR (PEG-KCL-NACL-NASULF-NA ASC-C) As per prep instructions.  #1 x 0   Entered by:   Wyona Almas RN   Authorized by:   Hilarie Fredrickson MD   Signed by:   Wyona Almas RN on 08/31/2008   Method used:   [facility]        [facility] [address] [phone]* (retail)       [address]       [place], [place]  [address]       Ph: [phone] or [phone]       Fax: [phone]   RxID:   [id number]

## 2010-05-19 ENCOUNTER — Other Ambulatory Visit: Payer: Self-pay | Admitting: Internal Medicine

## 2010-07-15 ENCOUNTER — Telehealth: Payer: Self-pay | Admitting: Cardiology

## 2010-07-15 DIAGNOSIS — E785 Hyperlipidemia, unspecified: Secondary | ICD-10-CM

## 2010-07-15 DIAGNOSIS — I2510 Atherosclerotic heart disease of native coronary artery without angina pectoris: Secondary | ICD-10-CM

## 2010-07-15 DIAGNOSIS — I080 Rheumatic disorders of both mitral and aortic valves: Secondary | ICD-10-CM

## 2010-07-15 NOTE — Telephone Encounter (Signed)
This pt is due for echocardiogram (MR 396.3), lipid and liver profile (414.00, 272.4) and OV with Dr Riley Kill in May 2012. The pt already has an appointment scheduled with Dr Riley Kill on 09/04/10. Order placed for Echo and lipid and liver profile. I will forward this message to [facility] to contact the pt with appointments.

## 2010-07-15 NOTE — Telephone Encounter (Signed)
Pt states the doctor wanted her to get a test done before her next appt. Pt does not know what kind but she know he wanted her to get one done.

## 2010-08-15 ENCOUNTER — Other Ambulatory Visit: Payer: Self-pay | Admitting: Cardiology

## 2010-08-27 NOTE — Assessment & Plan Note (Signed)
[facility]                            CARDIOLOGY OFFICE NOTE   Jennifer, Donaldson                MRN:          [id number]  DATE:12/15/2006                            DOB:          1933/02/12    Jennifer Donaldson is in for followup.  She really is doing well and her last LDL  was 55.  She denies any chest pain.  She has had 2 prior drug-eluting  stent placed in the LAD and right coronary artery and she is on a single  aspirin a day.  She has had no recurrent symptoms whatsoever.   MEDICATIONS:  1. Prometrium as directed.  2. Aspirin 81 mg daily.  3. Vaginal cream.  4. Vytorin 10/40.  5. Vivelle patch.  6. Citrucel.  7. Vitamin D.   PHYSICAL EXAMINATION:  She is very alert and oriented.  Blood pressure is 120/64 with a pulse of 70.  The lung fields are clear.  There is a minimal apical systolic murmur.  EXTREMITIES:  Reveal no edema.   IMPRESSION:  1. Coronary artery disease, status post coronary artery bypass graft      surgery.  2. Hypercholesterolemia, on lipid-lowering therapy with excellent      targets.  3. Mild mitral regurgitation by 2-D echocardiography.   PLAN:  1. Return to clinic in 6 months.  2. We will repeat the 2-D echo at that time.  3. She will continue on current medical regimen.  4. We have had an extensive discussion.     Arturo Morton. Riley Kill, MD, [facility]  Electronically Signed    TDS/MedQ  DD: 12/15/2006  DT: 12/16/2006  Job #: [id number]

## 2010-08-27 NOTE — Letter (Signed)
Aug 26, 2007    Bruce H. Swords, M.D.  [address] [place], [place] [address]   RE:  Jennifer, Donaldson  MRN:  [id number]  /  DOB:  10/13/1932   Dear Smitty Cords:   I had the pleasure of seeing Jennifer Donaldson in the office today in  follow-up.  She is now out nearly four years from undergoing  percutaneous stenting using drug-eluting stents in both her left  anterior descending and right coronary artery.  She has subsequently  discontinued Plavix, and done quite well on single-dose aspirin.  She  has not had any recurrent problems.  Her last LDL cholesterol was well  within the target range.  She feels well.  She has had some challenges  with her husband, who has NPH.   Her current medications include:  1. Prometrium as directed.  2. Aspirin 81 mg daily.  3. Vytorin 10/40 every night.  4. Vivelle patch 0.05 twice weekly.  5. Citrucel b.i.d.  6. Vitamin D.  7. She recently has been placed on amoxicillin.   On physical, the blood pressure is 114/64, the pulse is 69.  The lung fields are clear.  The cardiac rhythm is regular.  EXTREMITIES:  Reveal no edema.   Electrocardiogram demonstrates normal sinus rhythm and essentially  within normal limits.   In summary, this very nice lady has had successful percutaneous  stenting.  She has had no recurrent symptoms after having placement of  drug-eluting stents in 2 vessels.  She has hypercholesterolemia that is  well controlled.  She also has mild mitral regurgitation by 2-D  echocardiogram, but I have examined her carefully, and I cannot  appreciate a significant mitral regurgitation murmur.   We plan to see her back in follow-up of one year's time.  Should there  be any problems in the interim, please not hesitate to let us know.  She  did ask that we perform a lipid and liver profile.  We will obtain  these.  I will send them to your office.    Sincerely,      Arturo Morton. Riley Kill, MD, [facility]  Electronically  Signed    TDS/MedQ  DD: 08/26/2007  DT: 08/26/2007  Job #: [id number]

## 2010-08-29 ENCOUNTER — Other Ambulatory Visit (INDEPENDENT_AMBULATORY_CARE_PROVIDER_SITE_OTHER): Payer: MEDICARE | Admitting: *Deleted

## 2010-08-29 ENCOUNTER — Ambulatory Visit (HOSPITAL_COMMUNITY): Payer: MEDICARE | Attending: Cardiology | Admitting: Radiology

## 2010-08-29 DIAGNOSIS — I2510 Atherosclerotic heart disease of native coronary artery without angina pectoris: Secondary | ICD-10-CM

## 2010-08-29 DIAGNOSIS — I059 Rheumatic mitral valve disease, unspecified: Secondary | ICD-10-CM | POA: Insufficient documentation

## 2010-08-29 DIAGNOSIS — I080 Rheumatic disorders of both mitral and aortic valves: Secondary | ICD-10-CM

## 2010-08-29 DIAGNOSIS — I079 Rheumatic tricuspid valve disease, unspecified: Secondary | ICD-10-CM | POA: Insufficient documentation

## 2010-08-29 DIAGNOSIS — E785 Hyperlipidemia, unspecified: Secondary | ICD-10-CM

## 2010-08-29 LAB — LIPID PANEL
Cholesterol: 149 mg/dL (ref 0–200)
HDL: 88 mg/dL (ref >39.00)
LDL Cholesterol: 50 mg/dL (ref 0–99)
Total CHOL/HDL Ratio: 2
Triglycerides: 53 mg/dL (ref 0.0–149.0)
VLDL: 10.6 mg/dL (ref 0.0–40.0)

## 2010-08-29 LAB — HEPATIC FUNCTION PANEL
ALT: 19 U/L (ref 0–35)
AST: 32 U/L (ref 0–37)
Albumin: 3.9 g/dL (ref 3.5–5.2)
Alkaline Phosphatase: 45 U/L (ref 39–117)
Bilirubin, Direct: 0.1 mg/dL (ref 0.0–0.3)
Total Bilirubin: 0.6 mg/dL (ref 0.3–1.2)
Total Protein: 6.8 g/dL (ref 6.0–8.3)

## 2010-08-30 NOTE — Op Note (Signed)
Jennifer Donaldson, BROOKOVER         ACCOUNT NO.:  [id number]   MEDICAL RECORD NO.:  [id number]          PATIENT TYPE:  AMB   LOCATION:  [facility]                         FACILITY:  [facility]   PHYSICIAN:  Gretta Cool, M.D. DATE OF BIRTH:  Nov 08, 1932   DATE OF PROCEDURE:  12/03/2004  DATE OF DISCHARGE:                                 OPERATIVE REPORT   PREOPERATIVE DIAGNOSIS:  Abnormal uterine bleeding on hormone replacement  therapy.   POSTOPERATIVE DIAGNOSIS:  Uterine leiomyomata, submucosal, with abnormal  uterine bleeding.   PROCEDURE:  Hysteroscopy, resection of uterine leiomyomata and fundal wall,  and total endometrial resection for ablation plus VaporTrode.   SURGEON:  Gretta Cool, M.D.   ANESTHESIA:  MAC plus paracervical block.   DESCRIPTION OF PROCEDURE:  Under excellent general anesthesia with the  patient prepped and draped in lithotomy position in Winthrop stirrups, the  cervix was grasped with a single-toothed tenaculum and pulled down into  view. The cervix was then progressively dilated with a series of Pratt  dilators to accommodate the 7-mm resectoscope. The resectoscope was then  introduced and the endometrial cavity photographed. There was a very  thickened endometrial lining in all the areas. On the right fundal wall a  fibroid was identified and resected progressively until it was completely  enucleated from the capsule. The capsule base was then cauterized with the  loop. At this point the entire endometrial cavity was resected down 5 mm or  more into the myometrium so as to remove the endometrium. Once all of the  endometrium had been removed and there was no further evidence of gland  openings or remaining endometrium the VaporTrode electrode was applied and  the entire cavity treated with VaporTrode so as to eliminate any superficial  endometrium out in the myometrial tissue. At this point, once the entire  cavity was treated and the corneal areas treated  by touch technique, the  procedure was terminated without complication. At reduced pressure there was  no significant bleeding. The patient returned to the recovery room in  excellent condition.           ______________________________  Gretta Cool, M.D.     CWL/MEDQ  D:  12/03/2004  T:  12/03/2004  Job:  [id number]   cc:   Valetta Mole. Swords, M.D. [facility]

## 2010-08-30 NOTE — Cardiovascular Report (Signed)
NAME:  NHYLA, NAPPI                   ACCOUNT NO.:  [id number]   MEDICAL RECORD NO.:  [id number]                   PATIENT TYPE:  OIB   LOCATION:  6526                                 FACILITY:  [facility]   PHYSICIAN:  Charlies Constable, M.D. [facility]              DATE OF BIRTH:  March 22, 1933   DATE OF PROCEDURE:  10/03/2003  DATE OF DISCHARGE:  10/04/2003                              CARDIAC CATHETERIZATION   CLINICAL HISTORY:  Ms. Fouts is 77 years old and has a positive family  history for coronary heart disease with a brother who had a large heart  attack at a young age.  She recently developed exertional chest tightness  and had a Cardiolite scan ordered by Dr. Cato Mulligan which was positive.  She saw  Dr. Riley Kill in the office and he scheduled her for evaluation with  angiography.   PROCEDURE:  The procedure was performed via the right femoral artery using  arterial sheath and preformed coronary catheters. A front wall arterial  puncture was performed and Omnipaque contrast was used.  After completion of  the diagnostic study, we made a decision to proceed with two-vessel PCI.   The patient was given Angiomax bolus and infusion and 300 mg of Plavix.  We  initially approached the left anterior descending artery.  We used a Q-3.5 6  [place] guiding catheter with side holes and an Asahi soft wire.  We crossed  the lesion with the wire without difficulty.  We predilated with a 2.25 x 20-  mm Maverick performing three inflations up to 8 atmospheres for 30 seconds.  We then attempted to pass a 2.5 x 28-mm Cypher stent, but this was  unsuccessful.  We then put a second buddy wire which was an extra support  Floppy wire next to the Pemberwick wire.  We were able with some difficulty and  with deep throating the guiding catheter to pass the Cypher stent down into  position.  We deployed this initially with 9 atmospheres for 30 seconds to  avoid overexpanding the distal edge.  We then post dilated  with a 2.5 x 20-  mm Quantum Maverick performing three inflations up to 20 atmospheres for 20  seconds avoiding the distal edge.  Repeat diagnostic study was then  performed through the guiding catheter.   We then approached the right coronary artery. We used a JR-4  6 [place]  guiding catheter with side holes and the same Asahi soft wire.  We crossed  the lesion with the wire with some difficulty.  We initially were unable to  pass the Asahi wire across the lesion and we left that wire in place and  passed a PT-2 light support wire across the lesion and then removed the  Asahi wire.  We predilated with the same 2.5 x 20-mm Maverick. We then  deployed a 2.5 x 23-mm Cypher stent deploying this with one inflation of 14  atmospheres for 30 seconds.  We then post dilated with a 2.75 x 20-mm  Quantum Maverick performing three inflations up to 20 atmospheres for 30  seconds.  We then felt that the stent was not quite fully expanded in its  mid portion so we went in with a 3.0 x 20-mm Quantum Maverick performing one  inflation up to 20 atmospheres for 30 seconds.  Repeat diagnostics were then  performed through the guiding catheter.  The patient tolerated the procedure  well and left the laboratory in satisfactory condition.   RESULTS:  Left main coronary artery:  The left main coronary was free of  significant disease.   Left anterior descending artery:  The left anterior descending artery gave  rise to two diagonal branches and a septal perforator.  There was segmental  disease in the proximal to mid vessel with focal area of 95% stenosis.   Circumflex artery:  The circumflex artery gave rise to a marginal branch and  an AV branch which terminated in the posterior lateral branch.  These  vessels were free of significant disease.   Right coronary artery:  The right coronary artery was a moderately large  vessel that gave rise to a right ventricular branch, posterior descending  branch, and  a posterior lateral branch.  There was 95% stenosis in the mid  portion of the right coronary artery.   LEFT VENTRICULOGRAM:  The left ventriculogram performed in the RAO  projection showed hypokinesis of the inferobasal segment.  The overall wall  motion was good with an estimated ejection fraction of 60%.   Following stenting of the lesion in the LAD, the stenosis improved from 95%  to less than 10%.   Following stenting in the lesion in the mid right coronary artery, the  stenosis improved from 95% to less than 10%.   The aortic pressure was 148/73 with mean of 107.  Left ventricular pressure  was 148/26.   CONCLUSION:  1. Coronary artery disease with 95% stenosis in the proximal left anterior     descending artery, no significant obstruction of the circumflex artery,     95% stenosis in the mid right coronary artery inferobasal wall     hypokinesis.  Estimated ejection fraction of 60%.  2. Successful deployment of a Cypher stent in the proximal left anterior     descending with improvement in stent renarrowing from 95% to less than     10%.  3. Successful deployment of a Cypher stent in the mid right coronary artery     with improvement in stent renarrowing from 95% to less than 10%.   DISPOSITION:  The patient will return to postangioplasty unit for further  observation.                                               Charlies Constable, M.D. [facility]    BB/MEDQ  D:  10/03/2003  T:  10/05/2003  Job:  [id number]   cc:   Valetta Mole. Swords, M.D. [facility]   Maisie Fus D. Riley Kill, M.D. [facility]

## 2010-08-30 NOTE — Discharge Summary (Signed)
NAME:  Jennifer Donaldson, Jennifer Donaldson                   ACCOUNT NO.:  [id number]   MEDICAL RECORD NO.:  [id number]                   PATIENT TYPE:  OIB   LOCATION:  6526                                 FACILITY:  [facility]   PHYSICIAN:  Arturo Morton. Riley Kill, M.D. [facility]         DATE OF BIRTH:  1933/04/09   DATE OF ADMISSION:  10/03/2003  DATE OF DISCHARGE:                           DISCHARGE SUMMARY - REFERRING   PROCEDURES:  1. Coronary angiogram.  2. Stenting left anterior descending and right coronary artery, June 21.   REASON FOR ADMISSION:  The patient is a 77 year old female with no prior  history of heart disease, who recently presented with exertional chest pain.  She was referred for exercise stress Cardiolite which revealed inferior  ischemia.  The patient presented for elective cardiac catheterization.   LABORATORY DATA:  CBC normal on admission.  INR 0.9, electrolytes and renal  function normal.  CPK 97/ 8.1 (post-PCI).   ADMISSION CHEST X-RAY:  No active disease.   HOSPITAL COURSE:  The patient presented for elective cardiac  catheterization, performed by Dr. Charlies Constable.  See report for full  details, revealing significant two-vessel coronary artery disease with a 95%  proximal LAD and a 90% mid RCA.  Circumflex artery was normal.  Left  ventricular function was normal (EF of 60%) with inferobasal hypokinesis.   Dr. Juanda Chance proceeded with successful stenting (CYPHER) above the LAD and RCA  lesions, both to less than 10% residual stenosis.  There were no noted  complications.  The patient was kept for overnight observation.   The patient was cleared for discharge the following morning, in  hemodynamically stable condition.  There was no complaint of chest pain, and  no noted complications of the right coronary incision site.   FINAL RECOMMENDATIONS:  At time of discharge:  Treatment with Plavix for six  months, with concomitant overlap of full dose aspirin and down titration to  82  mg daily; addition of Zocor.   DISCHARGE MEDICATIONS:  1. Plavix mg daily (x6 months).  2. Enteric coated aspirin, 325 mg daily.  3. Zocor 20 mg q.h.s.  4. __________0.05 mg as previously directed.  5. Prometrium 200 mg as previously directed.  6. Actonel 35 mg q. Weekly.  7. Nitrostat 0.4 mg p.r.n.   DISCHARGE INSTRUCTIONS:  1. No heavy lifting/ driving x2 days.  2. Maintain low-fat, low-cholesterol diet.  3. Call the office if there is any swelling or bleeding of the groin.   FOLLOWUP:  The patient will follow up with Dr. Arturo Morton. Stuckey on July 11,  at 10:00 a.m.   DISCHARGE DIAGNOSES:  1. Two-vessel coronary artery disease.     A. Status post stent, (CYPHER) 95% proximal LAD and 95% mid to RCA on        June 21.     B. Normal left ventricular function.  2. Hypercholesterolemia.  3. Remote tobacco.      Gene Serpe, P.A. [facility]  Arturo Morton. Riley Kill, M.D. [facility]    GS/MEDQ  D:  10/04/2003  T:  10/05/2003  Job:  [id number]   cc:   Valetta Mole. Swords, M.D. [facility]

## 2010-08-30 NOTE — Assessment & Plan Note (Signed)
[facility]                            CARDIOLOGY OFFICE NOTE   BRIANAH, HOPSON                MRN:          [id number]  DATE:06/16/2006                            DOB:          1932-09-23    Jennifer Donaldson is in for followup. She has had a cold recently and  therefore has been a little fatigued. She actually is starting to feel a  little bit better. She had been under a huge amount of stress with her  husband however. He has had a number of things occur and has not been  feeling well.   She denies any chest pain or significant shortness of breath.   MEDICATIONS:  1. Prometrium as directed.  2. Aspirin 81 mg daily.  3. Clobetasol.  4. Vytorin 10/40 q nightly.  5. Vivelle patch.  6. Citrucel.  7. Vitamin D3.   PHYSICAL EXAMINATION:  She is alert, oriented female in no distress.  Blood pressure is 114/71, pulse is 90. The weight is 110 pounds. The  jugular veins are absolutely nondistended.  LUNGS:  Clear to auscultation and percussion.  There is no RV lift. The first and second heart sounds are normal. I do  not appreciate an increase in P2.  EXTREMITIES: Reveal no edema.   The electrocardiogram does reveal normal sinus rhythm as a rightward  oriented axis and fairly prominent P-waves in II, III and AVF which are  more prominent than the one year earlier.   IMPRESSION:  1. Coronary artery disease status post coronary artery stenting.  2. Hypercholesterolemia, on lipid-lowering therapy.  3. Slightly prominent P-waves by EKG.   PLAN:  1. Will do a 2D echocardiogram to followup to make sure she does not      have underlying pulmonary hypertension.  2. Lipid and liver profile will be obtained.  3. She will be seen in followup in one year.     Arturo Morton. Riley Kill, MD, [facility]  Electronically Signed    TDS/MedQ  DD: 06/16/2006  DT: 06/16/2006  Job #: [id number]   cc:   Valetta Mole. Swords, MD

## 2010-09-04 ENCOUNTER — Ambulatory Visit: Payer: Self-pay | Admitting: Cardiology

## 2010-09-04 ENCOUNTER — Telehealth: Payer: Self-pay | Admitting: Cardiology

## 2010-09-04 NOTE — Telephone Encounter (Signed)
Wants echo results °

## 2010-09-04 NOTE — Telephone Encounter (Signed)
Pt aware of echo and lab results.

## 2010-09-17 ENCOUNTER — Encounter: Payer: Self-pay | Admitting: Cardiology

## 2010-10-11 ENCOUNTER — Ambulatory Visit: Payer: Self-pay | Admitting: Cardiology

## 2010-10-21 ENCOUNTER — Other Ambulatory Visit: Payer: Self-pay | Admitting: Gynecology

## 2010-10-21 DIAGNOSIS — Z1231 Encounter for screening mammogram for malignant neoplasm of breast: Secondary | ICD-10-CM

## 2010-11-04 ENCOUNTER — Encounter: Payer: Self-pay | Admitting: Cardiology

## 2010-11-08 ENCOUNTER — Ambulatory Visit (INDEPENDENT_AMBULATORY_CARE_PROVIDER_SITE_OTHER): Payer: Medicare Other | Admitting: Cardiology

## 2010-11-08 ENCOUNTER — Encounter: Payer: Self-pay | Admitting: Cardiology

## 2010-11-08 VITALS — BP 109/59 | HR 73 | Resp 18 | Ht 60.0 in | Wt 111.0 lb

## 2010-11-08 DIAGNOSIS — I2510 Atherosclerotic heart disease of native coronary artery without angina pectoris: Secondary | ICD-10-CM

## 2010-11-08 NOTE — Assessment & Plan Note (Signed)
Recent echo done, and only trivial MR.  No audible murmur.  Will now follow prn and allow Dr. Cato Mulligan to determine if and when she needs to be seen again.

## 2010-11-08 NOTE — Assessment & Plan Note (Signed)
At target.  Excellent results on Vytorin which she has taken for a long time.  Anticipated results of IMPROVE IT discussed with patient. Long term decisions on drug therapy will likely reside on the results of this trial.  She likely would do well on simva alone, but she was in good shape prior to her PCI.

## 2010-11-08 NOTE — Progress Notes (Signed)
HPI:  Doing well.  No chest pain.  Feels good.  No complaints.  Tolerates meds well.  Sees Dr. Cato Mulligan for medical issues.  Lipids and echo results reviewed in detail with patient.    Current Outpatient Prescriptions  Medication Sig Dispense Refill  . aspirin 81 MG tablet Take 81 mg by mouth daily.        . Calcium Citrate-Vitamin D 200-250 MG-UNIT TABS Take 2 tablets by mouth 2 (two) times daily.        . Cholecalciferol (VITAMIN D3) 1000 UNITS CAPS Take 1 tablet by mouth daily.        . clobetasol (TEMOVATE) 0.05 % cream Apply topically 2 (two) times a week.        . estradiol (ESTRACE) 0.1 MG/GM vaginal cream Place vaginally 2 (two) times a week. As directed       . estradiol (VIVELLE-DOT) 0.025 MG/24HR Place 1 patch onto the skin 2 (two) times a week.        . Glucosamine-Chondroitin (GLUCOSAMINE CHONDR COMPLEX PO) Take 3 capsules by mouth daily.        . progesterone (PROMETRIUM) 200 MG capsule Take 200 mg by mouth. 1st 12 days of every 4th month       . VYTORIN 10-40 MG per tablet TAKE 1 TABLET BY MOUTH EVERY DAY  30 tablet  10    Allergies  Allergen Reactions  . Codeine Sulfate     REACTION: sensitivity to    Past Medical History  Diagnosis Date  . Mitral regurgitation   . CAD (coronary artery disease)   . Hyperlipidemia   . Raynaud's disease   . Osteopenia   . Special screening for malignant neoplasm of colon     Past Surgical History  Procedure Date  . Coronary stent placement     2 prior drug eluting stent placed in the LAD and right coronary artery     No family history on file.  History   Social History  . Marital Status: Married    Spouse Name: N/A    Number of Children: N/A  . Years of Education: N/A   Occupational History  . Not on file.   Social History Main Topics  . Smoking status: Never Smoker   . Smokeless tobacco: Not on file  . Alcohol Use: Not on file  . Drug Use: Not on file  . Sexually Active: Not on file   Other Topics Concern  . Not  on file   Social History Narrative  . No narrative on file    ROS: Please see the HPI.  All other systems reviewed and negative.  PHYSICAL EXAM:  BP 109/59  Pulse 73  Resp 18  Ht 5' (1.524 m)  Wt 111 lb (50.349 kg)  BMI 21.68 kg/m2  General: Well developed, well nourished, in no acute distress. Head:  Normocephalic and atraumatic. Neck: no JVD Lungs: Clear to auscultation and percussion. Heart: Normal S1 and S2.  No murmur, rubs or gallops.  Pulses: Pulses normal in all 4 extremities. Extremities: No clubbing or cyanosis. No edema. Neurologic: Alert and oriented x 3.  EKG:  NSR.  Possible LAE.  Otherwise normal.   ASSESSMENT AND PLAN:

## 2010-12-04 ENCOUNTER — Ambulatory Visit
Admission: RE | Admit: 2010-12-04 | Discharge: 2010-12-04 | Disposition: A | Discharge status: Home / Self Care | Payer: Medicare Other | Source: Ambulatory Visit | Attending: Gynecology | Admitting: Gynecology

## 2010-12-04 DIAGNOSIS — Z1231 Encounter for screening mammogram for malignant neoplasm of breast: Secondary | ICD-10-CM

## 2011-01-20 ENCOUNTER — Other Ambulatory Visit: Payer: Self-pay | Admitting: Gynecology

## 2011-01-22 ENCOUNTER — Ambulatory Visit (INDEPENDENT_AMBULATORY_CARE_PROVIDER_SITE_OTHER): Payer: Medicare Other | Admitting: Internal Medicine

## 2011-01-22 DIAGNOSIS — Z23 Encounter for immunization: Secondary | ICD-10-CM

## 2011-04-18 ENCOUNTER — Encounter: Payer: Medicare Other | Admitting: Internal Medicine

## 2011-05-07 ENCOUNTER — Ambulatory Visit (INDEPENDENT_AMBULATORY_CARE_PROVIDER_SITE_OTHER): Payer: Medicare Other | Admitting: Internal Medicine

## 2011-05-07 ENCOUNTER — Encounter: Payer: Self-pay | Admitting: Internal Medicine

## 2011-05-07 DIAGNOSIS — Z0000 Encounter for general adult medical examination without abnormal findings: Secondary | ICD-10-CM

## 2011-05-07 DIAGNOSIS — I2510 Atherosclerotic heart disease of native coronary artery without angina pectoris: Secondary | ICD-10-CM

## 2011-05-07 MED ORDER — AMLODIPINE BESYLATE 2.5 MG PO TABS: 2.5000 mg | ORAL_TABLET | Freq: Every day | ORAL | Status: DC

## 2011-05-07 MED ORDER — ATORVASTATIN CALCIUM 40 MG PO TABS: 40.0000 mg | ORAL_TABLET | Freq: Every day | ORAL | Status: DC

## 2011-05-07 NOTE — Assessment & Plan Note (Signed)
No sxs Continue risk factor modification 

## 2011-05-07 NOTE — Progress Notes (Signed)
Patient ID: Jennifer Donaldson, female   DOB: [birth year], 78 y.o.   MRN: [id number] cpx  Past Medical History  Diagnosis Date  . Mitral regurgitation   . CAD (coronary artery disease)   . Hyperlipidemia   . Raynaud's disease   . Osteopenia   . Special screening for malignant neoplasm of colon     History   Social History  . Marital Status: Married    Spouse Name: N/A    Number of Children: N/A  . Years of Education: N/A   Occupational History  . Not on file.   Social History Main Topics  . Smoking status: Never Smoker   . Smokeless tobacco: Not on file  . Alcohol Use: Not on file  . Drug Use: Not on file  . Sexually Active: Not on file   Other Topics Concern  . Not on file   Social History Narrative  . No narrative on file    Past Surgical History  Procedure Date  . Coronary stent placement     2 prior drug eluting stent placed in the LAD and right coronary artery     No family history on file.  Allergies  Allergen Reactions  . Codeine Sulfate     REACTION: sensitivity to    Current Outpatient Prescriptions on File Prior to Visit  Medication Sig Dispense Refill  . aspirin 81 MG tablet Take 81 mg by mouth daily.        . Cholecalciferol (VITAMIN D3) 1000 UNITS CAPS Take 1 tablet by mouth daily.        . clobetasol (TEMOVATE) 0.05 % cream Apply topically 2 (two) times a week.        . estradiol (ESTRACE) 0.1 MG/GM vaginal cream Place vaginally 2 (two) times a week. As directed       . estradiol (VIVELLE-DOT) 0.025 MG/24HR Place 1 patch onto the skin 2 (two) times a week.        . Glucosamine-Chondroitin (GLUCOSAMINE CHONDR COMPLEX PO) Take 3 capsules by mouth daily.        . progesterone (PROMETRIUM) 200 MG capsule Take 200 mg by mouth. 1st 12 days of every 4th month       . VYTORIN 10-40 MG per tablet TAKE 1 TABLET BY MOUTH EVERY DAY  30 tablet  10     patient denies chest pain, shortness of breath, orthopnea. Denies lower extremity edema, abdominal  pain, change in appetite, change in bowel movements. Patient denies rashes, musculoskeletal complaints. No other specific complaints in a complete review of systems.   BP 112/74  Pulse 76  Temp(Src) 98 F (36.7 C) (Oral)  Ht 4\' 11"  (1.499 m)  Wt 111 lb (50.349 kg)  BMI 22.42 kg/m2  Well-developed well-nourished female in no acute distress. HEENT exam atraumatic, normocephalic, extraocular muscles are intact. Neck is supple. No jugular venous distention no thyromegaly. Chest clear to auscultation without increased work of breathing. Cardiac exam S1 and S2 are regular. Abdominal exam active bowel sounds, soft, nontender. Extremities no edema. Neurologic exam she is alert without any motor sensory deficits. Gait is normal.  Well visit: health Maint UTD

## 2011-07-02 ENCOUNTER — Other Ambulatory Visit (INDEPENDENT_AMBULATORY_CARE_PROVIDER_SITE_OTHER): Payer: Medicare Other

## 2011-07-02 DIAGNOSIS — Z79899 Other long term (current) drug therapy: Secondary | ICD-10-CM

## 2011-07-02 DIAGNOSIS — I2510 Atherosclerotic heart disease of native coronary artery without angina pectoris: Secondary | ICD-10-CM

## 2011-07-02 LAB — BASIC METABOLIC PANEL WITH GFR
BUN: 19 mg/dL (ref 6–23)
CO2: 27 meq/L (ref 19–32)
Calcium: 9.3 mg/dL (ref 8.4–10.5)
Chloride: 102 meq/L (ref 96–112)
Creatinine, Ser: 0.7 mg/dL (ref 0.4–1.2)
GFR: 86.24 mL/min
Glucose, Bld: 69 mg/dL — ABNORMAL LOW (ref 70–99)
Potassium: 4.6 meq/L (ref 3.5–5.1)
Sodium: 138 meq/L (ref 135–145)

## 2011-07-02 LAB — CBC WITH DIFFERENTIAL/PLATELET
Basophils Absolute: 0.1 K/uL (ref 0.0–0.1)
Basophils Relative: 0.8 % (ref 0.0–3.0)
Eosinophils Absolute: 0.2 K/uL (ref 0.0–0.7)
Eosinophils Relative: 3.1 % (ref 0.0–5.0)
HCT: 39.3 % (ref 36.0–46.0)
Hemoglobin: 13 g/dL (ref 12.0–15.0)
Lymphocytes Relative: 28.6 % (ref 12.0–46.0)
Lymphs Abs: 2.2 K/uL (ref 0.7–4.0)
MCHC: 33.1 g/dL (ref 30.0–36.0)
MCV: 92.7 fl (ref 78.0–100.0)
Monocytes Absolute: 0.7 K/uL (ref 0.1–1.0)
Monocytes Relative: 8.9 % (ref 3.0–12.0)
Neutro Abs: 4.5 K/uL (ref 1.4–7.7)
Neutrophils Relative %: 58.6 % (ref 43.0–77.0)
Platelets: 298 K/uL (ref 150.0–400.0)
RBC: 4.24 Mil/uL (ref 3.87–5.11)
RDW: 14.1 % (ref 11.5–14.6)
WBC: 7.7 K/uL (ref 4.5–10.5)

## 2011-07-02 LAB — HEPATIC FUNCTION PANEL
ALT: 22 U/L (ref 0–35)
AST: 31 U/L (ref 0–37)
Albumin: 4 g/dL (ref 3.5–5.2)
Alkaline Phosphatase: 54 U/L (ref 39–117)
Bilirubin, Direct: 0 mg/dL (ref 0.0–0.3)
Total Bilirubin: 0.4 mg/dL (ref 0.3–1.2)
Total Protein: 7.5 g/dL (ref 6.0–8.3)

## 2011-07-02 LAB — LIPID PANEL
Cholesterol: 162 mg/dL (ref 0–200)
HDL: 92 mg/dL
LDL Cholesterol: 54 mg/dL (ref 0–99)
Total CHOL/HDL Ratio: 2
Triglycerides: 81 mg/dL (ref 0.0–149.0)
VLDL: 16.2 mg/dL (ref 0.0–40.0)

## 2011-07-02 LAB — TSH: TSH: 1.05 u[IU]/mL (ref 0.35–5.50)

## 2011-10-27 ENCOUNTER — Encounter: Payer: Self-pay | Admitting: Cardiology

## 2011-10-27 ENCOUNTER — Ambulatory Visit (INDEPENDENT_AMBULATORY_CARE_PROVIDER_SITE_OTHER): Payer: Medicare Other | Admitting: Cardiology

## 2011-10-27 VITALS — BP 106/64 | HR 66 | Ht 59.0 in | Wt 110.0 lb

## 2011-10-27 DIAGNOSIS — I2510 Atherosclerotic heart disease of native coronary artery without angina pectoris: Secondary | ICD-10-CM

## 2011-10-27 DIAGNOSIS — E785 Hyperlipidemia, unspecified: Secondary | ICD-10-CM

## 2011-10-27 DIAGNOSIS — I080 Rheumatic disorders of both mitral and aortic valves: Secondary | ICD-10-CM

## 2011-10-27 NOTE — Assessment & Plan Note (Signed)
Trivial by echo.

## 2011-10-27 NOTE — Progress Notes (Signed)
HPI:  The patient is in for followup. She's noticed over the past few months rather more prominent fatigue. She has not been sleeping well. She is to provide a lot of daytime things for her husband who is physically disabled. She sometimes feels like she can't put 1 foot in front of another. When she had this previously was when she underwent stenting of both her LAD and right coronary artery. At that time she did have a bit of jaw discomfort, and she's not had that at the present time. Otherwise the symptoms are fairly similar. She was initially picked up on the stress nuclear imaging study, and referred for catheterization which time she underwent placement of stents to both left anterior descending and right coronary artery. She drug-eluting stents placed at that time, specifically Cypher stents.  She denies exertional tightness or radiation of any symptoms.  More than thirty minutes spent with patient.  Current Outpatient Prescriptions  Medication Sig Dispense Refill  . aspirin 81 MG tablet Take 81 mg by mouth daily.        Marland Kitchen atorvastatin (LIPITOR) 40 MG tablet Take 1 tablet (40 mg total) by mouth daily.  90 tablet  3  . calcium citrate-vitamin D (CITRACAL+D) 315-200 MG-UNIT per tablet Take 2 tablets by mouth daily.       . chlorhexidine (PERIDEX) 0.12 % solution Twice daily.      . Cholecalciferol (VITAMIN D3) 1000 UNITS CAPS Take 1 tablet by mouth daily.        . clobetasol (TEMOVATE) 0.05 % cream Apply topically 2 (two) times a week.        . estradiol (ESTRACE) 0.1 MG/GM vaginal cream Place vaginally 2 (two) times a week. As directed       . estradiol (VIVELLE-DOT) 0.025 MG/24HR Place 1 patch onto the skin 2 (two) times a week.        . progesterone (PROMETRIUM) 200 MG capsule Take 200 mg by mouth. 1st 12 days of every 4th month         Allergies  Allergen Reactions  . Codeine Sulfate     REACTION: sensitivity to    Past Medical History  Diagnosis Date  . Mitral regurgitation   .  CAD (coronary artery disease)   . Hyperlipidemia   . Raynaud's disease   . Osteopenia   . Special screening for malignant neoplasm of colon     Past Surgical History  Procedure Date  . Coronary stent placement     2 prior drug eluting stent placed in the LAD and right coronary artery     No family history on file.  History   Social History  . Marital Status: Married    Spouse Name: N/A    Number of Children: N/A  . Years of Education: N/A   Occupational History  . Not on file.   Social History Main Topics  . Smoking status: Never Smoker   . Smokeless tobacco: Not on file  . Alcohol Use: Not on file  . Drug Use: Not on file  . Sexually Active: Not on file   Other Topics Concern  . Not on file   Social History Narrative  . No narrative on file    ROS: Please see the HPI.  All other systems reviewed and negative.  PHYSICAL EXAM:  BP 106/64  Pulse 66  Ht 4\' 11"  (1.499 m)  Wt 110 lb (49.896 kg)  BMI 22.22 kg/m2  SpO2 99%  General: Well developed,  well nourished, in no acute distress. Head:  Normocephalic and atraumatic. Neck: no JVD Lungs: Clear to auscultation and percussion. Heart: Normal S1 and S2.  No murmur, rubs or gallops.  Pulses: Pulses normal in all 4 extremities. Extremities: No clubbing or cyanosis. No edema. Neurologic: Alert and oriented x 3.  EKG:  NSR.  Rightward axis.  ASSESSMENT AND PLAN:

## 2011-10-27 NOTE — Patient Instructions (Signed)
Your physician recommends that you schedule a follow-up appointment in:4 weeks with Dr. Riley Kill  Your physician has requested that you have an exercise stress myoview. For further information please visit [email or web address] Please follow instruction sheet, as given.

## 2011-10-27 NOTE — Assessment & Plan Note (Signed)
Her major complaint at this time is that of fatigue. It's not clear if this is related to her underlying coronary artery disease, but this is how she presented originally. We will do another stress nuclear to reassess her overall situation. She is able to work out relatively well without current symptoms, and the nuclear may be helpful in trying to better delineate disease as she previously had well documented high-grade two-vessel disease prior to her stenting. She is in agreement with this plan and will make recommendations to forward.

## 2011-10-27 NOTE — Assessment & Plan Note (Signed)
She is at target.  

## 2011-10-28 ENCOUNTER — Other Ambulatory Visit: Payer: Self-pay | Admitting: Gynecology

## 2011-10-28 DIAGNOSIS — Z1231 Encounter for screening mammogram for malignant neoplasm of breast: Secondary | ICD-10-CM

## 2011-11-04 ENCOUNTER — Ambulatory Visit (HOSPITAL_COMMUNITY): Payer: Medicare Other | Attending: Cardiovascular Disease | Admitting: Radiology

## 2011-11-04 VITALS — BP 134/69 | HR 60 | Ht <= 58 in | Wt 109.0 lb

## 2011-11-04 DIAGNOSIS — I2510 Atherosclerotic heart disease of native coronary artery without angina pectoris: Secondary | ICD-10-CM | POA: Insufficient documentation

## 2011-11-04 MED ORDER — TECHNETIUM TC 99M TETROFOSMIN IV KIT
11.0000 | PACK | Freq: Once | INTRAVENOUS | Status: AC | PRN
  Administered 2011-11-04: 11 via INTRAVENOUS

## 2011-11-04 MED ORDER — TECHNETIUM TC 99M TETROFOSMIN IV KIT
33.0000 | PACK | Freq: Once | INTRAVENOUS | Status: AC | PRN
  Administered 2011-11-04: 33 via INTRAVENOUS

## 2011-11-04 NOTE — Progress Notes (Signed)
[facility] SITE 3 NUCLEAR MED [address] [place] [address] [phone]  Cardiology Nuclear Med Study  BITHA FAUTEUX is a 78 y.o. female     MRN : [id number]     DOB: 1932/08/22  Procedure Date: 11/04/2011  Nuclear Med Background Indication for Stress Test:  Evaluation for Ischemia and Stent Patency History:  '05 UXL:[id number] ischemia>Cath>Stent-LAD/RCA, EF=60%; 5/12 Echo:EF=60%, trivial MR; Raynauds Cardiac Risk Factors: Family History - CAD, History of Smoking and Lipids  Symptoms:  DOE and Palpitations   Nuclear Pre-Procedure Caffeine/Decaff Intake:  None NPO After: 9:00pm   Lungs:  Clear. IV 0.9% NS with Angio Cath:  22g  IV Site: R Wrist  IV Started by:  Stanton Kidney, EMT-P  Chest Size (in):  32 Cup Size: B  Height: 4\' 10"  (1.473 m)  Weight:  109 lb (49.442 kg)  BMI:  Body mass index is 22.78 kg/(m^2). Tech Comments:  NA    Nuclear Med Study 1 or 2 day study: 1 day  Stress Test Type:  Stress  Reading MD: Kristeen Miss, MD  Order Authorizing Provider:  Shawnie Pons, MD  Resting Radionuclide: Technetium 26m Tetrofosmin  Resting Radionuclide Dose: 11.0 mCi   Stress Radionuclide:  Technetium 12m Tetrofosmin  Stress Radionuclide Dose: 33.0 mCi           Stress Protocol Rest HR: 60 Stress HR: 146  Rest BP: 134/69 Stress BP: 171/63  Exercise Time (min): 6:00 METS: 7.0   Predicted Max HR: 143 bpm % Max HR: 102.1 bpm Rate Pressure Product: 25366   Dose of Adenosine (mg):  n/a Dose of Lexiscan: n/a mg  Dose of Atropine (mg): n/a Dose of Dobutamine: n/a mcg/kg/min (at max HR)  Stress Test Technologist: Smiley Houseman, CMA-N  Nuclear Technologist:  Domenic Polite, CNMT     Rest Procedure:  Myocardial perfusion imaging was performed at rest 45 minutes following the intravenous administration of Technetium 89m Tetrofosmin.  Rest ECG: No acute changes, occasional PJC.  Stress Procedure:  The patient exercised on the treadmill utilizing  the Bruce protocol for six minutes. She then stopped due to fatigue and denied any chest pain.  There were no diagnostic ST-T wave changes, only nonspecific changes with occasional PAC's.  Technetium 37m Tetrofosmin was injected at peak exercise and myocardial perfusion imaging was performed after a brief delay.  Stress ECG: No significant change from baseline ECG  QPS Raw Data Images:  Normal; no motion artifact; normal heart/lung ratio. Stress Images:  Normal homogeneous uptake in all areas of the myocardium. Rest Images:  Normal homogeneous uptake in all areas of the myocardium. Subtraction (SDS):  Normal Transient Ischemic Dilatation (Normal <1.22):  1.24 Lung/Heart Ratio (Normal <0.45):  0.28  Quantitative Gated Spect Images QGS EDV:  27 ml QGS ESV:  04 ml  Impression Exercise Capacity:  Good exercise capacity. BP Response:  Normal blood pressure response. Clinical Symptoms:  No significant symptoms noted. ECG Impression:  No significant ST segment change suggestive of ischemia. Comparison with Prior Nuclear Study: No images to compare  Overall Impression:  Normal stress nuclear study.  No evidence of ischemia.  Normal LV function LV Ejection Fraction: 85%.  LV Wall Motion:  NL LV Function; NL Wall Motion.    Vesta Mixer, Montez Hageman., MD, [facility] 11/04/2011, 4:37 PM Office - [phone] Pager [phone]

## 2011-12-03 ENCOUNTER — Ambulatory Visit: Payer: Medicare Other | Admitting: Cardiology

## 2011-12-05 ENCOUNTER — Ambulatory Visit
Admission: RE | Admit: 2011-12-05 | Discharge: 2011-12-05 | Disposition: A | Discharge status: Home / Self Care | Payer: Medicare Other | Source: Ambulatory Visit | Attending: Gynecology | Admitting: Gynecology

## 2011-12-05 DIAGNOSIS — Z1231 Encounter for screening mammogram for malignant neoplasm of breast: Secondary | ICD-10-CM

## 2012-01-28 ENCOUNTER — Telehealth: Payer: Self-pay | Admitting: Internal Medicine

## 2012-01-28 NOTE — Telephone Encounter (Signed)
Pt called and wanted to know if she needs to get any lab work done, before starting back on Atorvastatin?Pt was suppose to start back on med tomorrow.

## 2012-01-29 NOTE — Telephone Encounter (Signed)
Pt can come in for a lipid and liver panel.  Pt needs to be fasting 

## 2012-01-29 NOTE — Telephone Encounter (Signed)
Called and lft pt a vm that it is ok to sch to labs as noted.

## 2012-01-29 NOTE — Telephone Encounter (Signed)
Pt called and has been schd for labs as noted.

## 2012-01-30 ENCOUNTER — Other Ambulatory Visit (INDEPENDENT_AMBULATORY_CARE_PROVIDER_SITE_OTHER): Payer: Medicare Other

## 2012-01-30 DIAGNOSIS — E785 Hyperlipidemia, unspecified: Secondary | ICD-10-CM

## 2012-01-30 LAB — HEPATIC FUNCTION PANEL
ALT: 15 U/L (ref 0–35)
AST: 28 U/L (ref 0–37)
Albumin: 3.8 g/dL (ref 3.5–5.2)
Alkaline Phosphatase: 49 U/L (ref 39–117)
Bilirubin, Direct: 0.1 mg/dL (ref 0.0–0.3)
Total Bilirubin: 0.6 mg/dL (ref 0.3–1.2)
Total Protein: 7 g/dL (ref 6.0–8.3)

## 2012-01-30 LAB — LIPID PANEL
Cholesterol: 215 mg/dL — ABNORMAL HIGH (ref 0–200)
HDL: 82.5 mg/dL (ref >39.00)
Total CHOL/HDL Ratio: 3
Triglycerides: 65 mg/dL (ref 0.0–149.0)
VLDL: 13 mg/dL (ref 0.0–40.0)

## 2012-01-30 LAB — LDL CHOLESTEROL, DIRECT: Direct LDL: 99.8 mg/dL

## 2012-02-03 ENCOUNTER — Other Ambulatory Visit: Payer: Self-pay | Admitting: Gynecology

## 2012-02-10 ENCOUNTER — Ambulatory Visit (INDEPENDENT_AMBULATORY_CARE_PROVIDER_SITE_OTHER): Payer: Medicare Other

## 2012-02-10 DIAGNOSIS — Z23 Encounter for immunization: Secondary | ICD-10-CM

## 2012-02-11 DIAGNOSIS — Z23 Encounter for immunization: Secondary | ICD-10-CM

## 2012-02-24 ENCOUNTER — Other Ambulatory Visit: Payer: Self-pay | Admitting: Gynecology

## 2012-04-26 ENCOUNTER — Ambulatory Visit (INDEPENDENT_AMBULATORY_CARE_PROVIDER_SITE_OTHER): Payer: Medicare Other | Admitting: Family Medicine

## 2012-04-26 ENCOUNTER — Encounter: Payer: Self-pay | Admitting: Family Medicine

## 2012-04-26 VITALS — BP 122/80 | HR 75 | Temp 97.5°F | Wt 106.0 lb

## 2012-04-26 DIAGNOSIS — R21 Rash and other nonspecific skin eruption: Secondary | ICD-10-CM

## 2012-04-26 NOTE — Patient Instructions (Addendum)
-  please stop any lotions that have scent or color and use only hypoallergenic products  -would suggest cerave wash and lotion  -follow up with dermatologist as scheduled or sooner if spreading, worsneing or other symptoms

## 2012-04-26 NOTE — Progress Notes (Signed)
Chief Complaint  Patient presents with  . rash on face    HPI:  Acute visit for rash: -started last few days -papules on face -hx redness, rash on face when gets hot or stressed -new face lotion on face for last week -denies fevers, chills, pain -has appt with derm next week ROS: See pertinent positives and negatives per HPI.  Past Medical History  Diagnosis Date  . Mitral regurgitation   . CAD (coronary artery disease)   . Hyperlipidemia   . Raynaud's disease   . Osteopenia   . Special screening for malignant neoplasm of colon     No family history on file.  History   Social History  . Marital Status: Married    Spouse Name: N/A    Number of Children: N/A  . Years of Education: N/A   Social History Main Topics  . Smoking status: Never Smoker   . Smokeless tobacco: None  . Alcohol Use: None  . Drug Use: None  . Sexually Active: None   Other Topics Concern  . None   Social History Narrative  . None    Current outpatient prescriptions:aspirin 81 MG tablet, Take 81 mg by mouth daily.  , Disp: , Rfl: ;  atorvastatin (LIPITOR) 40 MG tablet, Take 1 tablet (40 mg total) by mouth daily., Disp: 90 tablet, Rfl: 3;  calcium citrate-vitamin D (CITRACAL+D) 315-200 MG-UNIT per tablet, Take 2 tablets by mouth daily. , Disp: , Rfl: ;  chlorhexidine (PERIDEX) 0.12 % solution, Twice daily., Disp: , Rfl:  Cholecalciferol (VITAMIN D3) 1000 UNITS CAPS, Take 1 tablet by mouth daily.  , Disp: , Rfl: ;  clobetasol (TEMOVATE) 0.05 % cream, Apply topically 2 (two) times a week.  , Disp: , Rfl: ;  estradiol (ESTRACE) 0.1 MG/GM vaginal cream, Place vaginally 2 (two) times a week. As directed , Disp: , Rfl: ;  estradiol (VIVELLE-DOT) 0.025 MG/24HR, Place 1 patch onto the skin 2 (two) times a week.  , Disp: , Rfl:  progesterone (PROMETRIUM) 200 MG capsule, Take 200 mg by mouth. 1st 12 days of every 4th month , Disp: , Rfl:   EXAM:  Filed Vitals:   04/26/12 1405  BP: 122/80  Pulse: 75    Temp: 97.5 F (36.4 C)    There is no height on file to calculate BMI.  GENERAL: vitals reviewed and listed above, alert, oriented, appears well hydrated and in no acute distress  HEENT: atraumatic, conjunttiva clear, no obvious abnormalities on inspection of external nose and ears  NECK: no obvious masses on inspection  SKin: few scattered erythematous papules on face/cheeks/nose  MS: moves all extremities without noticeable abnormality  PSYCH: pleasant and cooperative, no obvious depression or anxiety  ASSESSMENT AND PLAN:  Discussed the following assessment and plan:  1. Skin rash    -contact derm versus rocescea - advised stopping new cream/ using only hypoallergenic products with follow up with derm as scheduled -Patient advised to return or notify a doctor immediately if symptoms worsen or persist or new concerns arise.  Patient Instructions  -please stop any lotions that have scent or color and use only hypoallergenic products  -would suggest cerave wash and lotion  -follow up with dermatologist as scheduled or sooner if spreading, worsneing or other symptoms     KIM, HANNAH R.

## 2012-05-11 ENCOUNTER — Telehealth: Payer: Self-pay | Admitting: Internal Medicine

## 2012-05-11 ENCOUNTER — Other Ambulatory Visit: Payer: Self-pay | Admitting: Internal Medicine

## 2012-05-11 MED ORDER — AMLODIPINE BESYLATE 2.5 MG PO TABS: 2.5000 mg | ORAL_TABLET | Freq: Every day | ORAL | Status: DC

## 2012-05-11 NOTE — Telephone Encounter (Signed)
Opened in error

## 2012-05-11 NOTE — Telephone Encounter (Signed)
Pt needs refill of amLODipine (NORVASC) 2.5 MG tablet.  Pharm: [facility]  Pt cancelled CPX due to inclement weather projections, but rsc for April

## 2012-05-11 NOTE — Telephone Encounter (Signed)
rx sent in electronically 

## 2012-05-12 ENCOUNTER — Encounter: Payer: Medicare Other | Admitting: Internal Medicine

## 2012-07-20 ENCOUNTER — Ambulatory Visit (INDEPENDENT_AMBULATORY_CARE_PROVIDER_SITE_OTHER): Payer: Medicare Other | Admitting: Internal Medicine

## 2012-07-20 ENCOUNTER — Encounter: Payer: Self-pay | Admitting: Internal Medicine

## 2012-07-20 VITALS — BP 124/72 | HR 76 | Temp 97.8°F | Ht 59.0 in | Wt 110.0 lb

## 2012-07-20 DIAGNOSIS — M549 Dorsalgia, unspecified: Secondary | ICD-10-CM

## 2012-07-20 DIAGNOSIS — E785 Hyperlipidemia, unspecified: Secondary | ICD-10-CM

## 2012-07-20 DIAGNOSIS — Z0000 Encounter for general adult medical examination without abnormal findings: Secondary | ICD-10-CM

## 2012-07-20 DIAGNOSIS — I2510 Atherosclerotic heart disease of native coronary artery without angina pectoris: Secondary | ICD-10-CM

## 2012-07-20 LAB — LDL CHOLESTEROL, DIRECT: Direct LDL: 124.7 mg/dL

## 2012-07-20 LAB — HEPATIC FUNCTION PANEL
ALT: 15 U/L (ref 0–35)
AST: 27 U/L (ref 0–37)
Albumin: 4.1 g/dL (ref 3.5–5.2)
Alkaline Phosphatase: 48 U/L (ref 39–117)
Bilirubin, Direct: 0.1 mg/dL (ref 0.0–0.3)
Total Bilirubin: 0.6 mg/dL (ref 0.3–1.2)
Total Protein: 7.3 g/dL (ref 6.0–8.3)

## 2012-07-20 LAB — CBC WITH DIFFERENTIAL/PLATELET
Basophils Absolute: 0.1 10*3/uL (ref 0.0–0.1)
Basophils Relative: 1.2 % (ref 0.0–3.0)
Eosinophils Absolute: 0.1 10*3/uL (ref 0.0–0.7)
Eosinophils Relative: 2.1 % (ref 0.0–5.0)
HCT: 38.6 % (ref 36.0–46.0)
Hemoglobin: 12.9 g/dL (ref 12.0–15.0)
Lymphocytes Relative: 30 % (ref 12.0–46.0)
Lymphs Abs: 2 10*3/uL (ref 0.7–4.0)
MCHC: 33.6 g/dL (ref 30.0–36.0)
MCV: 89.8 fl (ref 78.0–100.0)
Monocytes Absolute: 0.6 10*3/uL (ref 0.1–1.0)
Monocytes Relative: 9.2 % (ref 3.0–12.0)
Neutro Abs: 3.9 10*3/uL (ref 1.4–7.7)
Neutrophils Relative %: 57.5 % (ref 43.0–77.0)
Platelets: 310 10*3/uL (ref 150.0–400.0)
RBC: 4.3 Mil/uL (ref 3.87–5.11)
RDW: 14.1 % (ref 11.5–14.6)
WBC: 6.7 10*3/uL (ref 4.5–10.5)

## 2012-07-20 LAB — BASIC METABOLIC PANEL
BUN: 17 mg/dL (ref 6–23)
CO2: 27 mEq/L (ref 19–32)
Calcium: 9.2 mg/dL (ref 8.4–10.5)
Chloride: 101 mEq/L (ref 96–112)
Creatinine, Ser: 0.8 mg/dL (ref 0.4–1.2)
GFR: 77.05 mL/min (ref >60.00)
Glucose, Bld: 84 mg/dL (ref 70–99)
Potassium: 4.1 mEq/L (ref 3.5–5.1)
Sodium: 139 mEq/L (ref 135–145)

## 2012-07-20 LAB — LIPID PANEL
Cholesterol: 231 mg/dL — ABNORMAL HIGH (ref 0–200)
HDL: 80.2 mg/dL (ref >39.00)
Total CHOL/HDL Ratio: 3
Triglycerides: 91 mg/dL (ref 0.0–149.0)
VLDL: 18.2 mg/dL (ref 0.0–40.0)

## 2012-07-20 LAB — TSH: TSH: 1.55 u[IU]/mL (ref 0.35–5.50)

## 2012-07-20 NOTE — Progress Notes (Signed)
cpx  Past Medical History  Diagnosis Date  . Mitral regurgitation   . CAD (coronary artery disease)   . Hyperlipidemia   . Raynaud's disease   . Osteopenia   . Special screening for malignant neoplasm of colon     History   Social History  . Marital Status: Married    Spouse Name: N/A    Number of Children: N/A  . Years of Education: N/A   Occupational History  . Not on file.   Social History Main Topics  . Smoking status: Never Smoker   . Smokeless tobacco: Not on file  . Alcohol Use: Not on file  . Drug Use: Not on file  . Sexually Active: Not on file   Other Topics Concern  . Not on file   Social History Narrative  . No narrative on file    Past Surgical History  Procedure Laterality Date  . Coronary stent placement      2 prior drug eluting stent placed in the LAD and right coronary artery     No family history on file.  Allergies  Allergen Reactions  . Codeine Sulfate     REACTION: sensitivity to    Current Outpatient Prescriptions on File Prior to Visit  Medication Sig Dispense Refill  . amLODipine (NORVASC) 2.5 MG tablet Take 1 tablet (2.5 mg total) by mouth daily.  90 tablet  1  . aspirin 81 MG tablet Take 81 mg by mouth daily.        Marland Kitchen atorvastatin (LIPITOR) 40 MG tablet Take 1 tablet (40 mg total) by mouth daily.  90 tablet  3  . calcium citrate-vitamin D (CITRACAL+D) 315-200 MG-UNIT per tablet Take 2 tablets by mouth daily.       . chlorhexidine (PERIDEX) 0.12 % solution Twice daily.      . Cholecalciferol (VITAMIN D3) 1000 UNITS CAPS Take 1 tablet by mouth daily.        . clobetasol (TEMOVATE) 0.05 % cream Apply topically 2 (two) times a week.        . estradiol (ESTRACE) 0.1 MG/GM vaginal cream Place vaginally 2 (two) times a week. As directed       . estradiol (VIVELLE-DOT) 0.025 MG/24HR Place 1 patch onto the skin 2 (two) times a week.        . progesterone (PROMETRIUM) 200 MG capsule Take 200 mg by mouth. 1st 12 days of every 4th month         No current facility-administered medications on file prior to visit.     patient denies chest pain, shortness of breath, orthopnea. Denies lower extremity edema, abdominal pain, change in appetite, change in bowel movements. Patient denies rashes, musculoskeletal complaints. No other specific complaints in a complete review of systems.   There were no vitals taken for this visit.  Well-developed well-nourished female in no acute distress. HEENT exam atraumatic, normocephalic, extraocular muscles are intact. Neck is supple. No jugular venous distention no thyromegaly. Chest clear to auscultation without increased work of breathing. Cardiac exam S1 and S2 are regular. Abdominal exam active bowel sounds, soft, nontender. Extremities no edema. Neurologic exam she is alert without any motor sensory deficits. Gait is normal.  Well visit: health maint utd Will check labs today

## 2012-07-21 LAB — POCT URINALYSIS DIPSTICK
Bilirubin, UA: NEGATIVE
Blood, UA: NEGATIVE
Glucose, UA: NEGATIVE
Ketones, UA: NEGATIVE
Leukocytes, UA: NEGATIVE
Nitrite, UA: NEGATIVE
Protein, UA: NEGATIVE
Spec Grav, UA: 1.02
Urobilinogen, UA: 0.2
pH, UA: 6

## 2012-07-22 ENCOUNTER — Other Ambulatory Visit: Payer: Self-pay | Admitting: *Deleted

## 2012-07-22 MED ORDER — ATORVASTATIN CALCIUM 10 MG PO TABS: 10.0000 mg | ORAL_TABLET | Freq: Every day | ORAL | Status: DC

## 2012-07-22 NOTE — Progress Notes (Signed)
Pt informed and med sent in 

## 2012-07-23 ENCOUNTER — Telehealth: Payer: Self-pay | Admitting: Internal Medicine

## 2012-07-23 NOTE — Telephone Encounter (Signed)
Dr Cato Mulligan, we did give her 2 different messages.  Which one is true?

## 2012-07-23 NOTE — Telephone Encounter (Signed)
Caller: Rilei/Patient; Phone: [phone]; Reason for Call: (1) Patient was called yesterday and told she should start a Cholesterol Medication- Atrovastain 10mg .  (2) She received a message today from [place] that said her lab work was normal?  Is it normal?  Should she take the Atrovastain?  What is her cholesterol?  Reviewed [facility] with patient and the order for Atrovastain and her cholesterol numbers. Patient expressed understading.  (3) She has scoliosis and having back pain.  Dr.  Cato Mulligan wanted her to have X-ray's .  Was this scheduled?  Or does she need to schedule?

## 2012-07-24 NOTE — Telephone Encounter (Signed)
I'm not sure why the computer behaves in this way. The urinalysis was okay and that was the message that was left with the urinalysis. The message that was left with the other laboratories suggest that the cholesterol is elevated and she should start atorvastatin. She should start atorvastatin.

## 2012-07-26 ENCOUNTER — Telehealth: Payer: Self-pay | Admitting: Internal Medicine

## 2012-07-26 NOTE — Telephone Encounter (Signed)
Caller: Ambermarie/Patient; Phone: [phone]; Reason for Call: Please call patient at number listed.  She was trying to return Cindy's, Dr.  Cato Mulligan' nurse, call.  Thank you,

## 2012-07-26 NOTE — Telephone Encounter (Signed)
Left message for pt to call back  °

## 2012-07-26 NOTE — Telephone Encounter (Signed)
Pt aware.

## 2012-08-03 ENCOUNTER — Ambulatory Visit (INDEPENDENT_AMBULATORY_CARE_PROVIDER_SITE_OTHER)
Admission: RE | Admit: 2012-08-03 | Discharge: 2012-08-03 | Disposition: A | Discharge status: Home / Self Care | Payer: Medicare Other | Source: Ambulatory Visit | Attending: Internal Medicine | Admitting: Internal Medicine

## 2012-08-03 DIAGNOSIS — M549 Dorsalgia, unspecified: Secondary | ICD-10-CM

## 2012-08-23 ENCOUNTER — Telehealth: Payer: Self-pay | Admitting: Internal Medicine

## 2012-08-23 DIAGNOSIS — M545 Low back pain, unspecified: Secondary | ICD-10-CM

## 2012-08-23 NOTE — Telephone Encounter (Signed)
Referral order was not entered in error.  Entered order and Joni Reining is working on it.

## 2012-08-23 NOTE — Telephone Encounter (Signed)
PT stated that she was supposed to be referred to a physical therapist a week ago and still has not heard anything. Please assist.

## 2012-09-02 ENCOUNTER — Ambulatory Visit: Payer: Medicare Other | Attending: Internal Medicine | Admitting: Physical Therapy

## 2012-09-02 DIAGNOSIS — R293 Abnormal posture: Secondary | ICD-10-CM | POA: Insufficient documentation

## 2012-09-02 DIAGNOSIS — IMO0001 Reserved for inherently not codable concepts without codable children: Secondary | ICD-10-CM | POA: Insufficient documentation

## 2012-09-14 ENCOUNTER — Ambulatory Visit: Payer: Medicare Other | Attending: Rehabilitation | Admitting: Rehabilitation

## 2012-09-14 DIAGNOSIS — IMO0001 Reserved for inherently not codable concepts without codable children: Secondary | ICD-10-CM | POA: Insufficient documentation

## 2012-09-14 DIAGNOSIS — R293 Abnormal posture: Secondary | ICD-10-CM | POA: Insufficient documentation

## 2012-09-20 ENCOUNTER — Ambulatory Visit: Payer: Medicare Other | Admitting: Physical Therapy

## 2012-09-27 ENCOUNTER — Ambulatory Visit: Payer: Medicare Other | Admitting: Physical Therapy

## 2012-10-04 ENCOUNTER — Ambulatory Visit: Payer: Medicare Other | Admitting: Physical Therapy

## 2012-10-11 ENCOUNTER — Ambulatory Visit: Payer: Medicare Other | Admitting: Physical Therapy

## 2012-10-19 ENCOUNTER — Ambulatory Visit: Payer: Medicare Other | Attending: Rehabilitation | Admitting: Physical Therapy

## 2012-10-19 DIAGNOSIS — R293 Abnormal posture: Secondary | ICD-10-CM | POA: Insufficient documentation

## 2012-10-19 DIAGNOSIS — IMO0001 Reserved for inherently not codable concepts without codable children: Secondary | ICD-10-CM | POA: Insufficient documentation

## 2012-10-25 ENCOUNTER — Ambulatory Visit: Payer: Medicare Other | Admitting: Physical Therapy

## 2012-11-01 ENCOUNTER — Ambulatory Visit: Payer: Medicare Other | Admitting: Physical Therapy

## 2012-11-02 ENCOUNTER — Other Ambulatory Visit: Payer: Self-pay

## 2012-11-02 DIAGNOSIS — Z1231 Encounter for screening mammogram for malignant neoplasm of breast: Secondary | ICD-10-CM

## 2012-12-07 ENCOUNTER — Ambulatory Visit: Payer: Medicare Other

## 2012-12-14 ENCOUNTER — Ambulatory Visit (INDEPENDENT_AMBULATORY_CARE_PROVIDER_SITE_OTHER): Payer: Medicare Other | Admitting: Internal Medicine

## 2012-12-14 DIAGNOSIS — Z23 Encounter for immunization: Secondary | ICD-10-CM

## 2012-12-21 ENCOUNTER — Ambulatory Visit
Admission: RE | Admit: 2012-12-21 | Discharge: 2012-12-21 | Disposition: A | Discharge status: Home / Self Care | Payer: Medicare Other | Source: Ambulatory Visit

## 2012-12-21 DIAGNOSIS — Z1231 Encounter for screening mammogram for malignant neoplasm of breast: Secondary | ICD-10-CM

## 2013-06-10 ENCOUNTER — Other Ambulatory Visit: Payer: Self-pay | Admitting: Internal Medicine

## 2013-07-14 ENCOUNTER — Other Ambulatory Visit: Payer: Self-pay | Admitting: Internal Medicine

## 2013-08-02 ENCOUNTER — Ambulatory Visit (INDEPENDENT_AMBULATORY_CARE_PROVIDER_SITE_OTHER): Payer: Commercial Managed Care - HMO | Admitting: Internal Medicine

## 2013-08-02 ENCOUNTER — Encounter: Payer: Self-pay | Admitting: Internal Medicine

## 2013-08-02 VITALS — BP 112/70 | HR 68 | Temp 97.5°F | Ht 59.25 in | Wt 106.0 lb

## 2013-08-02 DIAGNOSIS — M899 Disorder of bone, unspecified: Secondary | ICD-10-CM

## 2013-08-02 DIAGNOSIS — Z0000 Encounter for general adult medical examination without abnormal findings: Secondary | ICD-10-CM

## 2013-08-02 DIAGNOSIS — E785 Hyperlipidemia, unspecified: Secondary | ICD-10-CM

## 2013-08-02 DIAGNOSIS — I2510 Atherosclerotic heart disease of native coronary artery without angina pectoris: Secondary | ICD-10-CM

## 2013-08-02 DIAGNOSIS — M949 Disorder of cartilage, unspecified: Secondary | ICD-10-CM

## 2013-08-02 LAB — CBC WITH DIFFERENTIAL/PLATELET
Basophils Absolute: 0.1 10*3/uL (ref 0.0–0.1)
Basophils Relative: 0.8 % (ref 0.0–3.0)
Eosinophils Absolute: 0.2 10*3/uL (ref 0.0–0.7)
Eosinophils Relative: 1.7 % (ref 0.0–5.0)
HCT: 39.1 % (ref 36.0–46.0)
Hemoglobin: 13 g/dL (ref 12.0–15.0)
Lymphocytes Relative: 22.6 % (ref 12.0–46.0)
Lymphs Abs: 2 10*3/uL (ref 0.7–4.0)
MCHC: 33.2 g/dL (ref 30.0–36.0)
MCV: 92.7 fl (ref 78.0–100.0)
Monocytes Absolute: 0.7 10*3/uL (ref 0.1–1.0)
Monocytes Relative: 7.3 % (ref 3.0–12.0)
Neutro Abs: 6.1 10*3/uL (ref 1.4–7.7)
Neutrophils Relative %: 67.6 % (ref 43.0–77.0)
Platelets: 362 10*3/uL (ref 150.0–400.0)
RBC: 4.22 Mil/uL (ref 3.87–5.11)
RDW: 13.6 % (ref 11.5–14.6)
WBC: 9 10*3/uL (ref 4.5–10.5)

## 2013-08-02 LAB — BASIC METABOLIC PANEL
BUN: 19 mg/dL (ref 6–23)
CO2: 29 mEq/L (ref 19–32)
Calcium: 10.1 mg/dL (ref 8.4–10.5)
Chloride: 103 mEq/L (ref 96–112)
Creatinine, Ser: 0.8 mg/dL (ref 0.4–1.2)
GFR: 69.5 mL/min (ref >60.00)
Glucose, Bld: 85 mg/dL (ref 70–99)
Potassium: 4.3 mEq/L (ref 3.5–5.1)
Sodium: 142 mEq/L (ref 135–145)

## 2013-08-02 LAB — LIPID PANEL
Cholesterol: 174 mg/dL (ref 0–200)
HDL: 88.8 mg/dL (ref >39.00)
LDL Cholesterol: 74 mg/dL (ref 0–99)
Total CHOL/HDL Ratio: 2
Triglycerides: 56 mg/dL (ref 0.0–149.0)
VLDL: 11.2 mg/dL (ref 0.0–40.0)

## 2013-08-02 LAB — HEPATIC FUNCTION PANEL
ALT: 14 U/L (ref 0–35)
AST: 31 U/L (ref 0–37)
Albumin: 4 g/dL (ref 3.5–5.2)
Alkaline Phosphatase: 39 U/L (ref 39–117)
Bilirubin, Direct: 0.1 mg/dL (ref 0.0–0.3)
Total Bilirubin: 0.8 mg/dL (ref 0.3–1.2)
Total Protein: 7.4 g/dL (ref 6.0–8.3)

## 2013-08-02 LAB — TSH: TSH: 1.75 u[IU]/mL (ref 0.35–5.50)

## 2013-08-02 NOTE — Progress Notes (Signed)
Pre visit review using our clinic review tool, if applicable. No additional management support is needed unless otherwise documented below in the visit note. 

## 2013-08-02 NOTE — Progress Notes (Signed)
cpx  Past Medical History  Diagnosis Date  . Mitral regurgitation   . CAD (coronary artery disease)   . Hyperlipidemia   . Raynaud's disease   . Osteopenia   . Special screening for malignant neoplasm of colon     History   Social History  . Marital Status: Married    Spouse Name: N/A    Number of Children: N/A  . Years of Education: N/A   Occupational History  . Not on file.   Social History Main Topics  . Smoking status: Never Smoker   . Smokeless tobacco: Not on file  . Alcohol Use: Not on file  . Drug Use: Not on file  . Sexual Activity: Not on file   Other Topics Concern  . Not on file   Social History Narrative  . No narrative on file    Past Surgical History  Procedure Laterality Date  . Coronary stent placement      2 prior drug eluting stent placed in the LAD and right coronary artery     No family history on file.  Allergies  Allergen Reactions  . Codeine Sulfate     REACTION: sensitivity to    Current Outpatient Prescriptions on File Prior to Visit  Medication Sig Dispense Refill  . amLODipine (NORVASC) 2.5 MG tablet TAKE 1 TABLET (2.5 MG TOTAL) BY MOUTH DAILY.  90 tablet  0  . aspirin 81 MG tablet Take 81 mg by mouth daily.        Marland Kitchen atorvastatin (LIPITOR) 10 MG tablet TAKE 1 TABLET (10 MG TOTAL) BY MOUTH DAILY.  90 tablet  3  . calcium citrate-vitamin D (CITRACAL+D) 315-200 MG-UNIT per tablet Take 2 tablets by mouth daily.       . chlorhexidine (PERIDEX) 0.12 % solution Twice daily.      . Cholecalciferol (VITAMIN D3) 1000 UNITS CAPS Take 1 tablet by mouth daily.        Marland Kitchen estradiol (ESTRACE) 0.1 MG/GM vaginal cream Place vaginally 2 (two) times a week. As directed        No current facility-administered medications on file prior to visit.     patient denies chest pain, shortness of breath, orthopnea. Denies lower extremity edema, abdominal pain, change in appetite, change in bowel movements. Patient denies rashes, musculoskeletal complaints.  No other specific complaints in a complete review of systems.   BP 112/70  Pulse 68  Temp(Src) 97.5 F (36.4 C) (Oral)  Ht 4' 11.25" (1.505 m)  Wt 106 lb (48.081 kg)  BMI 21.23 kg/m2  Well-developed well-nourished female in no acute distress. HEENT exam atraumatic, normocephalic, extraocular muscles are intact. Neck is supple. No jugular venous distention no thyromegaly. Chest clear to auscultation without increased work of breathing. Cardiac exam S1 and S2 are regular. Abdominal exam active bowel sounds, soft, nontender. Extremities no edema. Neurologic exam she is alert without any motor sensory deficits. Gait is normal.  Well visit- health maint utd

## 2013-08-03 LAB — VITAMIN D 25 HYDROXY (VIT D DEFICIENCY, FRACTURES): Vit D, 25-Hydroxy: 51 ng/mL (ref 30–89)

## 2013-09-07 ENCOUNTER — Other Ambulatory Visit: Payer: Self-pay | Admitting: Internal Medicine

## 2013-11-02 ENCOUNTER — Other Ambulatory Visit: Payer: Self-pay

## 2013-11-02 DIAGNOSIS — Z1231 Encounter for screening mammogram for malignant neoplasm of breast: Secondary | ICD-10-CM

## 2013-12-09 ENCOUNTER — Other Ambulatory Visit: Payer: Self-pay | Admitting: Internal Medicine

## 2013-12-13 ENCOUNTER — Telehealth: Payer: Self-pay | Admitting: Internal Medicine

## 2013-12-13 DIAGNOSIS — Z1239 Encounter for other screening for malignant neoplasm of breast: Secondary | ICD-10-CM

## 2013-12-13 NOTE — Telephone Encounter (Addendum)
Pt needs referral for a 3 D mammogram and insurance requires. ([facility]) 9/15.  Pt has appt w/ dr Shawna Orleans on 10/6. Pt going to obgyn to Dr Christophe Louis w/ [facility].  Pt not sure if she needs referral to Dr Landry Mellow as well. Has appt 11/3 w/ cole.

## 2013-12-14 NOTE — Telephone Encounter (Signed)
Can you see if Dr. Leanne Chang with sign off on these orders and copy me.  I don't feel comfortable ordering tests when I have never seen pt.

## 2013-12-14 NOTE — Telephone Encounter (Signed)
Okay to refer? 

## 2013-12-16 NOTE — Telephone Encounter (Signed)
Order placed per Dr Leanne Chang

## 2013-12-21 ENCOUNTER — Encounter: Payer: Self-pay | Admitting: Internal Medicine

## 2013-12-27 ENCOUNTER — Ambulatory Visit
Admission: RE | Admit: 2013-12-27 | Discharge: 2013-12-27 | Disposition: A | Discharge status: Home / Self Care | Payer: Commercial Managed Care - HMO | Source: Ambulatory Visit

## 2013-12-27 ENCOUNTER — Encounter (INDEPENDENT_AMBULATORY_CARE_PROVIDER_SITE_OTHER): Payer: Self-pay

## 2013-12-27 DIAGNOSIS — Z1231 Encounter for screening mammogram for malignant neoplasm of breast: Secondary | ICD-10-CM

## 2014-01-16 ENCOUNTER — Ambulatory Visit: Payer: Commercial Managed Care - HMO | Admitting: *Deleted

## 2014-01-17 ENCOUNTER — Ambulatory Visit: Payer: Commercial Managed Care - HMO | Admitting: Internal Medicine

## 2014-01-17 ENCOUNTER — Ambulatory Visit (INDEPENDENT_AMBULATORY_CARE_PROVIDER_SITE_OTHER): Payer: Commercial Managed Care - HMO | Admitting: *Deleted

## 2014-01-17 DIAGNOSIS — Z23 Encounter for immunization: Secondary | ICD-10-CM

## 2014-01-27 ENCOUNTER — Telehealth: Payer: Self-pay | Admitting: Internal Medicine

## 2014-01-27 NOTE — Telephone Encounter (Signed)
Pt would like to know if you were able to find out if Dr. Leanne Chang was willing to keep her as a patient?  She says he is keeping her husband and she would like for Dr. Leanne Chang to do the same with her.  Please advise

## 2014-02-01 NOTE — Telephone Encounter (Signed)
Please advise 

## 2014-02-28 ENCOUNTER — Ambulatory Visit: Payer: Commercial Managed Care - HMO | Admitting: Internal Medicine

## 2014-03-01 ENCOUNTER — Ambulatory Visit (INDEPENDENT_AMBULATORY_CARE_PROVIDER_SITE_OTHER): Payer: Commercial Managed Care - HMO | Admitting: Internal Medicine

## 2014-03-01 ENCOUNTER — Encounter: Payer: Self-pay | Admitting: Internal Medicine

## 2014-03-01 VITALS — BP 114/64 | HR 64 | Temp 97.8°F | Ht 59.25 in | Wt 106.0 lb

## 2014-03-01 DIAGNOSIS — I25709 Atherosclerosis of coronary artery bypass graft(s), unspecified, with unspecified angina pectoris: Secondary | ICD-10-CM

## 2014-03-01 DIAGNOSIS — M810 Age-related osteoporosis without current pathological fracture: Secondary | ICD-10-CM

## 2014-03-01 DIAGNOSIS — I7300 Raynaud's syndrome without gangrene: Secondary | ICD-10-CM

## 2014-03-01 HISTORY — DX: Age-related osteoporosis without current pathological fracture: M81.0

## 2014-03-01 MED ORDER — AMLODIPINE BESYLATE 2.5 MG PO TABS: 2.5000 mg | ORAL_TABLET | Freq: Every day | ORAL | Status: DC

## 2014-03-01 NOTE — Patient Instructions (Addendum)
Monitor your blood pressure at home as directed Drop off your blood pressure readings in 4-6 weeks

## 2014-03-01 NOTE — Assessment & Plan Note (Signed)
Patient has history of primary Raynaud's. Patient still symptomatic despite treatment with amlodipine 2.5 mg. Patient advised to monitor her home blood pressure readings. If systolic blood pressure between 130 and 140, we discussed possibly increasing amlodipine dose to 5 mg.  Her blood pressure today 138/70 with manual cuff.

## 2014-03-01 NOTE — Assessment & Plan Note (Signed)
Management by her gynecologist. Obtain copy of bone density scan. She is tolerating Fosamax 70 mg once weekly.

## 2014-03-01 NOTE — Progress Notes (Signed)
Subjective:    Patient ID: Jennifer Donaldson, female    DOB: [birth year], 80 y.o.   MRN: [id number]  HPI  80 year old white female with history of coronary artery disease, hyperlipidemia and Raynaud's phenomenon to transfer care. Patient appears followed by Dr. Leanne Chang.  Patient's medical records reviewed. She was previously followed by Dr. Lia Foyer.  Patient has stent in left anterior descending and right coronary artery. She was last seen by cardiology in 2013. She has remote history of tobacco use. She reports she was a light smoker for approximately 20 years. She quit 30 years ago. Patient reports staying physically active. She denies chest pain, shortness of breath or palpitations.  Patient has history of chronic primary Raynaud's phenomenon.  Patient currently takes amlodipine 2.5 mg but is still symptomatic especially during winter months.  Patient reports she was recently diagnosed with osteoporosis. She is followed by her gynecologist. She has been on Fosamax for 2 weeks. She is tolerating well without any side effects.  Review of Systems Negative for chest pain or shortness of breath She denies history of falls    Past Medical History  Diagnosis Date  . Mitral regurgitation   . CAD (coronary artery disease)   . Hyperlipidemia   . Raynaud's disease   . Osteopenia   . Special screening for malignant neoplasm of colon     History   Social History  . Marital Status: Married    Spouse Name: N/A    Number of Children: N/A  . Years of Education: N/A   Occupational History  . Not on file.   Social History Main Topics  . Smoking status: Never Smoker   . Smokeless tobacco: Not on file  . Alcohol Use: Not on file  . Drug Use: Not on file  . Sexual Activity: Not on file   Other Topics Concern  . Not on file   Social History Narrative    Past Surgical History  Procedure Laterality Date  . Coronary stent placement      2 prior drug eluting stent placed in the LAD  and right coronary artery     Family History  Problem Relation Age of Onset  . CAD Brother   . Stroke Father   . CAD Mother     Allergies  Allergen Reactions  . Codeine Sulfate     REACTION: sensitivity to    Current Outpatient Prescriptions on File Prior to Visit  Medication Sig Dispense Refill  . aspirin 81 MG tablet Take 81 mg by mouth daily.      Marland Kitchen atorvastatin (LIPITOR) 10 MG tablet TAKE 1 TABLET (10 MG TOTAL) BY MOUTH DAILY. 90 tablet 3  . calcium citrate-vitamin D (CITRACAL+D) 315-200 MG-UNIT per tablet Take 2 tablets by mouth daily.     . chlorhexidine (PERIDEX) 0.12 % solution Twice daily.    . Cholecalciferol (VITAMIN D3) 1000 UNITS CAPS Take 1 tablet by mouth daily.       No current facility-administered medications on file prior to visit.    BP 114/64 mmHg  Pulse 64  Temp(Src) 97.8 F (36.6 C) (Oral)  Ht 4' 11.25" (1.505 m)  Wt 106 lb (48.081 kg)  BMI 21.23 kg/m2      Objective:   Physical Exam  Constitutional: She is oriented to person, place, and time. She appears well-developed and well-nourished. No distress.  HENT:  Head: Normocephalic and atraumatic.  Right Ear: External ear normal.  Left Ear: External ear normal.  Mouth/Throat: Oropharynx is  clear and moist.  Neck: Neck supple.  No carotid bruit  Cardiovascular: Normal rate, regular rhythm and normal heart sounds.   No murmur heard. Pulmonary/Chest: Effort normal and breath sounds normal. She has no wheezes.  Musculoskeletal: She exhibits no edema.  Lymphadenopathy:    She has no cervical adenopathy.  Neurological: She is alert and oriented to person, place, and time. No cranial nerve deficit.  Skin: Skin is warm and dry.  Psychiatric: She has a normal mood and affect. Her behavior is normal.          Assessment & Plan:

## 2014-03-01 NOTE — Progress Notes (Signed)
Pre visit review using our clinic review tool, if applicable. No additional management support is needed unless otherwise documented below in the visit note. 

## 2014-03-01 NOTE — Assessment & Plan Note (Signed)
It has been over 2 years since her last follow-up visit with her cardiologist. Her blood pressure is normal and she is maintained on statin therapy. She exercises on a regular basis and is asymptomatic.  However I still recommend patient follow-up with cardiologist at least on an annual basis. Her husband sees Dr. Harrington Challenger. Arrange referral.

## 2014-03-03 ENCOUNTER — Ambulatory Visit: Payer: Commercial Managed Care - HMO | Admitting: Cardiovascular Disease

## 2014-04-10 NOTE — Telephone Encounter (Signed)
Pt established with Dr Shawna Orleans.  Nothing further needed

## 2014-04-17 ENCOUNTER — Ambulatory Visit: Payer: Commercial Managed Care - HMO | Admitting: Cardiology

## 2014-04-28 ENCOUNTER — Ambulatory Visit: Payer: Commercial Managed Care - HMO | Admitting: Internal Medicine

## 2014-05-26 ENCOUNTER — Ambulatory Visit: Payer: Commercial Managed Care - HMO | Admitting: Internal Medicine

## 2014-05-30 ENCOUNTER — Telehealth: Payer: Self-pay | Admitting: Internal Medicine

## 2014-05-30 ENCOUNTER — Ambulatory Visit (INDEPENDENT_AMBULATORY_CARE_PROVIDER_SITE_OTHER): Payer: PPO | Admitting: Internal Medicine

## 2014-05-30 ENCOUNTER — Encounter: Payer: Self-pay | Admitting: Internal Medicine

## 2014-05-30 VITALS — BP 110/70 | HR 101 | Temp 97.8°F | Resp 18 | Ht 59.25 in | Wt 100.0 lb

## 2014-05-30 DIAGNOSIS — R197 Diarrhea, unspecified: Secondary | ICD-10-CM

## 2014-05-30 LAB — CBC WITH DIFFERENTIAL/PLATELET
Basophils Absolute: 0.1 10*3/uL (ref 0.0–0.1)
Basophils Relative: 0.5 % (ref 0.0–3.0)
Eosinophils Absolute: 0.4 10*3/uL (ref 0.0–0.7)
Eosinophils Relative: 3 % (ref 0.0–5.0)
HCT: 39.9 % (ref 36.0–46.0)
Hemoglobin: 13.3 g/dL (ref 12.0–15.0)
Lymphocytes Relative: 15 % (ref 12.0–46.0)
Lymphs Abs: 1.9 10*3/uL (ref 0.7–4.0)
MCHC: 33.3 g/dL (ref 30.0–36.0)
MCV: 88.9 fl (ref 78.0–100.0)
Monocytes Absolute: 1 10*3/uL (ref 0.1–1.0)
Monocytes Relative: 7.9 % (ref 3.0–12.0)
Neutro Abs: 9.1 10*3/uL — ABNORMAL HIGH (ref 1.4–7.7)
Neutrophils Relative %: 73.6 % (ref 43.0–77.0)
Platelets: 384 10*3/uL (ref 150.0–400.0)
RBC: 4.49 Mil/uL (ref 3.87–5.11)
RDW: 13.4 % (ref 11.5–15.5)
WBC: 12.4 10*3/uL — ABNORMAL HIGH (ref 4.0–10.5)

## 2014-05-30 LAB — BASIC METABOLIC PANEL
BUN: 14 mg/dL (ref 6–23)
CO2: 31 mEq/L (ref 19–32)
Calcium: 9.9 mg/dL (ref 8.4–10.5)
Chloride: 98 mEq/L (ref 96–112)
Creatinine, Ser: 0.79 mg/dL (ref 0.40–1.20)
GFR: 74.44 mL/min (ref >60.00)
Glucose, Bld: 79 mg/dL (ref 70–99)
Potassium: 5.3 mEq/L — ABNORMAL HIGH (ref 3.5–5.1)
Sodium: 135 mEq/L (ref 135–145)

## 2014-05-30 MED ORDER — DIPHENOXYLATE-ATROPINE 2.5-0.025 MG PO TABS: 1.0000 | ORAL_TABLET | Freq: Four times a day (QID) | ORAL | Status: DC | PRN

## 2014-05-30 NOTE — Telephone Encounter (Signed)
Spoke to pt, told her she can get the Rx filled if she wants it is up to her Dr. Hardie Pulley. If you want to try that instead of Immodium. Pt verbalized understanding.

## 2014-05-30 NOTE — Patient Instructions (Signed)

## 2014-05-30 NOTE — Progress Notes (Signed)
Subjective:    Patient ID: Jennifer Donaldson, female    DOB: 03/16/1933, 81 y.o.   MRN: [id number]  HPI  81 year old patient who presents with a 7-10 day history of diarrhea.  The patient was treated on January 8 for acute bronchitis with 7 days of amoxicillin.  The patient has had diarrhea for the past week.  Associated symptoms include fatigue and poor appetite but no fever or chills.  She has some mild crampy abdominal pain.  She has been using Imodium with some benefit.  Yesterday she had 4 loose, small volume stools..  No vomiting.  She has been able to tolerate a clear liquid diet. Symptoms are improving.  She did take Pepto-Bismol recently and did have a single black stool.as   Past Medical History  Diagnosis Date  . Mitral regurgitation   . CAD (coronary artery disease)   . Hyperlipidemia   . Raynaud's disease   . Osteopenia   . Special screening for malignant neoplasm of colon     History   Social History  . Marital Status: Married    Spouse Name: N/A  . Number of Children: N/A  . Years of Education: N/A   Occupational History  . Not on file.   Social History Main Topics  . Smoking status: Former Smoker -- 0.50 packs/day for 20 years    Types: Cigarettes    Quit date: 09/01/1983  . Smokeless tobacco: Not on file  . Alcohol Use: Not on file  . Drug Use: Not on file  . Sexual Activity: Not on file   Other Topics Concern  . Not on file   Social History Narrative   Retired Engineer, structural   Married for 4 years - husband has multiple medical issues and is disabled   4 children   Grew up in [place]    Past Surgical History  Procedure Laterality Date  . Coronary stent placement      2 prior drug eluting stent placed in the LAD and right coronary artery     Family History  Problem Relation Age of Onset  . CAD Brother   . Stroke Father   . CAD Mother     Allergies  Allergen Reactions  . Codeine Sulfate     REACTION: sensitivity to     Current Outpatient Prescriptions on File Prior to Visit  Medication Sig Dispense Refill  . alendronate (FOSAMAX) 70 MG tablet Take 70 mg by mouth once a week.  11  . amLODipine (NORVASC) 2.5 MG tablet Take 1 tablet (2.5 mg total) by mouth daily. 90 tablet 1  . aspirin 81 MG tablet Take 81 mg by mouth daily.      Marland Kitchen atorvastatin (LIPITOR) 10 MG tablet TAKE 1 TABLET (10 MG TOTAL) BY MOUTH DAILY. 90 tablet 3  . calcium citrate-vitamin D (CITRACAL+D) 315-200 MG-UNIT per tablet Take 2 tablets by mouth daily.     . chlorhexidine (PERIDEX) 0.12 % solution Twice daily.    . Cholecalciferol (VITAMIN D3) 1000 UNITS CAPS Take 1 tablet by mouth daily.       No current facility-administered medications on file prior to visit.    BP 110/70 mmHg  Pulse 101  Temp(Src) 97.8 F (36.6 C) (Oral)  Resp 18  Ht 4' 11.25" (1.505 m)  Wt 100 lb (45.36 kg)  BMI 20.03 kg/m2  SpO2 96%      Review of Systems  Constitutional: Positive for activity change, appetite change, fatigue and unexpected  weight change. Negative for fever.  HENT: Negative for congestion, dental problem, hearing loss, rhinorrhea, sinus pressure, sore throat and tinnitus.   Eyes: Negative for pain, discharge and visual disturbance.  Respiratory: Negative for cough and shortness of breath.   Cardiovascular: Negative for chest pain, palpitations and leg swelling.  Gastrointestinal: Positive for abdominal pain and diarrhea. Negative for nausea, vomiting, constipation, blood in stool and abdominal distention.  Genitourinary: Negative for dysuria, urgency, frequency, hematuria, flank pain, vaginal bleeding, vaginal discharge, difficulty urinating, vaginal pain and pelvic pain.  Musculoskeletal: Negative for joint swelling, arthralgias and gait problem.  Skin: Negative for rash.  Neurological: Negative for dizziness, syncope, speech difficulty, weakness, numbness and headaches.  Hematological: Negative for adenopathy.    Psychiatric/Behavioral: Negative for behavioral problems, dysphoric mood and agitation. The patient is not nervous/anxious.        Objective:   Physical Exam  Constitutional: She is oriented to person, place, and time. She appears well-developed and well-nourished. No distress.  HENT:  Head: Normocephalic.  Right Ear: External ear normal.  Left Ear: External ear normal.  Mouth/Throat: Oropharynx is clear and moist.  Eyes: Conjunctivae and EOM are normal. Pupils are equal, round, and reactive to light.  Neck: Normal range of motion. Neck supple. No thyromegaly present.  Cardiovascular: Normal rate, regular rhythm, normal heart sounds and intact distal pulses.   Pulmonary/Chest: Effort normal and breath sounds normal.  Abdominal: Soft. Bowel sounds are normal. She exhibits no distension and no mass. There is no tenderness. There is no rebound and no guarding.  Very mild generalized tenderness without guarding.  Bowel sounds active  Musculoskeletal: Normal range of motion.  Lymphadenopathy:    She has no cervical adenopathy.  Neurological: She is alert and oriented to person, place, and time.  Skin: Skin is warm and dry. No rash noted.  Psychiatric: She has a normal mood and affect. Her behavior is normal.          Assessment & Plan:  Diarrhea.  Probable viral gastroenteritis.  Clinically she is improved and diarrhea, also improving.  If there is any worsening or relapse.  We'll need to check stool for C. difficile toxin. We'll continue clear liquid diet and advance as tolerated  Patient instructions dispensed

## 2014-05-30 NOTE — Progress Notes (Signed)
Pre visit review using our clinic review tool, if applicable. No additional management support is needed unless otherwise documented below in the visit note. 

## 2014-05-30 NOTE — Telephone Encounter (Signed)
Pt just realized dr Raliegh Ip gave her a rx for diphenoxylate-atropine (LOMOTIL) 2.5-0.025 MG per tablet,   Pt states dr Raliegh Ip never mentioned a script and she wants to know if she should fill this?

## 2014-06-02 ENCOUNTER — Telehealth: Payer: Self-pay | Admitting: Internal Medicine

## 2014-06-02 NOTE — Telephone Encounter (Signed)
Pls advise.  

## 2014-06-02 NOTE — Telephone Encounter (Signed)
Pt saw dr Burnice Logan on 2-16 and would like blood work results. Please call pt

## 2014-06-02 NOTE — Telephone Encounter (Signed)
Called and spoke with pt and pt is aware.  

## 2014-06-02 NOTE — Telephone Encounter (Signed)
Please call/notify patient that lab/test/procedure is normal 

## 2014-06-09 ENCOUNTER — Encounter: Payer: Self-pay | Admitting: Internal Medicine

## 2014-06-09 ENCOUNTER — Ambulatory Visit (INDEPENDENT_AMBULATORY_CARE_PROVIDER_SITE_OTHER): Payer: PPO | Admitting: Internal Medicine

## 2014-06-09 VITALS — BP 140/80 | HR 88 | Temp 97.6°F | Resp 18 | Ht 59.25 in | Wt 100.0 lb

## 2014-06-09 DIAGNOSIS — I2510 Atherosclerotic heart disease of native coronary artery without angina pectoris: Secondary | ICD-10-CM

## 2014-06-09 DIAGNOSIS — E785 Hyperlipidemia, unspecified: Secondary | ICD-10-CM

## 2014-06-09 DIAGNOSIS — I2583 Coronary atherosclerosis due to lipid rich plaque: Principal | ICD-10-CM

## 2014-06-09 DIAGNOSIS — R197 Diarrhea, unspecified: Secondary | ICD-10-CM | POA: Diagnosis not present

## 2014-06-09 LAB — CBC WITH DIFFERENTIAL/PLATELET
Basophils Absolute: 0.1 10*3/uL (ref 0.0–0.1)
Basophils Relative: 0.5 % (ref 0.0–3.0)
Eosinophils Absolute: 0.4 10*3/uL (ref 0.0–0.7)
Eosinophils Relative: 3.8 % (ref 0.0–5.0)
HCT: 37.6 % (ref 36.0–46.0)
Hemoglobin: 12.7 g/dL (ref 12.0–15.0)
Lymphocytes Relative: 17.9 % (ref 12.0–46.0)
Lymphs Abs: 2 10*3/uL (ref 0.7–4.0)
MCHC: 33.7 g/dL (ref 30.0–36.0)
MCV: 88.7 fl (ref 78.0–100.0)
Monocytes Absolute: 0.9 10*3/uL (ref 0.1–1.0)
Monocytes Relative: 8.5 % (ref 3.0–12.0)
Neutro Abs: 7.7 10*3/uL (ref 1.4–7.7)
Neutrophils Relative %: 69.3 % (ref 43.0–77.0)
Platelets: 522 10*3/uL — ABNORMAL HIGH (ref 150.0–400.0)
RBC: 4.24 Mil/uL (ref 3.87–5.11)
RDW: 13.2 % (ref 11.5–15.5)
WBC: 11.1 10*3/uL — ABNORMAL HIGH (ref 4.0–10.5)

## 2014-06-09 LAB — BASIC METABOLIC PANEL
BUN: 10 mg/dL (ref 6–23)
CO2: 32 mEq/L (ref 19–32)
Calcium: 9.5 mg/dL (ref 8.4–10.5)
Chloride: 100 mEq/L (ref 96–112)
Creatinine, Ser: 0.75 mg/dL (ref 0.40–1.20)
GFR: 79.04 mL/min (ref >60.00)
Glucose, Bld: 99 mg/dL (ref 70–99)
Potassium: 4 mEq/L (ref 3.5–5.1)
Sodium: 136 mEq/L (ref 135–145)

## 2014-06-09 MED ORDER — METRONIDAZOLE 500 MG PO TABS: 500.0000 mg | ORAL_TABLET | Freq: Three times a day (TID) | ORAL | Status: DC

## 2014-06-09 MED ORDER — DIPHENOXYLATE-ATROPINE 2.5-0.025 MG PO TABS: 1.0000 | ORAL_TABLET | Freq: Four times a day (QID) | ORAL | Status: DC | PRN

## 2014-06-09 NOTE — Progress Notes (Signed)
Pre visit review using our clinic review tool, if applicable. No additional management support is needed unless otherwise documented below in the visit note. 

## 2014-06-09 NOTE — Patient Instructions (Signed)

## 2014-06-09 NOTE — Progress Notes (Signed)
Subjective:    Patient ID: Jennifer Donaldson, female    DOB: 1933-02-21, 81 y.o.   MRN: [id number]  HPI  05/30/14. 81 year old patient who presents with a 7-10 day history of diarrhea. The patient was treated on January 8 for acute bronchitis with 7 days of amoxicillin. The patient has had diarrhea for the past week. Associated symptoms include fatigue and poor appetite but no fever or chills. She has some mild crampy abdominal pain. She has been using Imodium with some benefit. Yesterday she had 4 loose, small volume stools.. No vomiting. She has been able to tolerate a clear liquid diet. Symptoms are improving. She did take Pepto-Bismol recently and did have a single black stool.  06/09/14.  Patient seen today in follow-up.  She continues to have a significant diarrhea, but also occurs at night, usually 4 times.  Diarrhea is described as quite watery.  She has significant crampy abdominal pain associated with the diarrhea.  She feels unwell with the anorexia and fatigue.  No documented fever  Past Medical History  Diagnosis Date  . Mitral regurgitation   . CAD (coronary artery disease)   . Hyperlipidemia   . Raynaud's disease   . Osteopenia   . Special screening for malignant neoplasm of colon     History   Social History  . Marital Status: Married    Spouse Name: N/A  . Number of Children: N/A  . Years of Education: N/A   Occupational History  . Not on file.   Social History Main Topics  . Smoking status: Former Smoker -- 0.50 packs/day for 20 years    Types: Cigarettes    Quit date: 09/01/1983  . Smokeless tobacco: Not on file  . Alcohol Use: Not on file  . Drug Use: Not on file  . Sexual Activity: Not on file   Other Topics Concern  . Not on file   Social History Narrative   Retired Engineer, structural   Married for 36 years - husband has multiple medical issues and is disabled   4 children   Grew up in [place]    Past Surgical History    Procedure Laterality Date  . Coronary stent placement      2 prior drug eluting stent placed in the LAD and right coronary artery     Family History  Problem Relation Age of Onset  . CAD Brother   . Stroke Father   . CAD Mother     Allergies  Allergen Reactions  . Codeine Sulfate     REACTION: sensitivity to    Current Outpatient Prescriptions on File Prior to Visit  Medication Sig Dispense Refill  . alendronate (FOSAMAX) 70 MG tablet Take 70 mg by mouth once a week.  11  . amLODipine (NORVASC) 2.5 MG tablet Take 1 tablet (2.5 mg total) by mouth daily. 90 tablet 1  . aspirin 81 MG tablet Take 81 mg by mouth daily.      Marland Kitchen atorvastatin (LIPITOR) 10 MG tablet TAKE 1 TABLET (10 MG TOTAL) BY MOUTH DAILY. 90 tablet 3  . calcium citrate-vitamin D (CITRACAL+D) 315-200 MG-UNIT per tablet Take 2 tablets by mouth daily.     . chlorhexidine (PERIDEX) 0.12 % solution Twice daily.    . Cholecalciferol (VITAMIN D3) 1000 UNITS CAPS Take 1 tablet by mouth daily.      . diphenoxylate-atropine (LOMOTIL) 2.5-0.025 MG per tablet Take 1 tablet by mouth 4 (four) times daily as needed for diarrhea  or loose stools. 30 tablet 0   No current facility-administered medications on file prior to visit.    BP 140/80 mmHg  Pulse 88  Temp(Src) 97.6 F (36.4 C) (Oral)  Resp 18  Ht 4' 11.25" (1.505 m)  Wt 100 lb (45.36 kg)  BMI 20.03 kg/m2     Review of Systems  Constitutional: Positive for activity change, appetite change and fatigue.  HENT: Negative for congestion, dental problem, hearing loss, rhinorrhea, sinus pressure, sore throat and tinnitus.   Eyes: Negative for pain, discharge and visual disturbance.  Respiratory: Negative for cough and shortness of breath.   Cardiovascular: Negative for chest pain, palpitations and leg swelling.  Gastrointestinal: Positive for abdominal pain and diarrhea. Negative for nausea, vomiting, constipation, blood in stool and abdominal distention.  Genitourinary:  Negative for dysuria, urgency, frequency, hematuria, flank pain, vaginal bleeding, vaginal discharge, difficulty urinating, vaginal pain and pelvic pain.  Musculoskeletal: Negative for joint swelling, arthralgias and gait problem.  Skin: Negative for rash.  Neurological: Negative for dizziness, syncope, speech difficulty, weakness, numbness and headaches.  Hematological: Negative for adenopathy.  Psychiatric/Behavioral: Negative for behavioral problems, dysphoric mood and agitation. The patient is not nervous/anxious.        Objective:   Physical Exam  Constitutional: She is oriented to person, place, and time. She appears well-developed and well-nourished.  HENT:  Head: Normocephalic.  Right Ear: External ear normal.  Left Ear: External ear normal.  Mouth/Throat: Oropharynx is clear and moist.  Eyes: Conjunctivae and EOM are normal. Pupils are equal, round, and reactive to light.  Neck: Normal range of motion. Neck supple. No thyromegaly present.  Cardiovascular: Normal rate, regular rhythm, normal heart sounds and intact distal pulses.   Pulmonary/Chest: Effort normal and breath sounds normal.  Abdominal: Soft. Bowel sounds are normal. She exhibits no mass. There is tenderness.  Very mild diffuse tenderness No distention or guarding Bowel sounds active  Musculoskeletal: Normal range of motion.  Lymphadenopathy:    She has no cervical adenopathy.  Neurological: She is alert and oriented to person, place, and time.  Skin: Skin is warm and dry. No rash noted.  Psychiatric: She has a normal mood and affect. Her behavior is normal.          Assessment & Plan:   Persistent diarrhea following antibiotic use.  High suspicion for C. difficile colitis.  Will check stool for C. difficile toxin and treat empirically at least through the weekend Hypertension, stable.  Dyslipidemia

## 2014-06-12 ENCOUNTER — Telehealth: Payer: Self-pay | Admitting: *Deleted

## 2014-06-12 NOTE — Telephone Encounter (Signed)
Pt called requesting the results of her stool sample, pt states the medication is making her very sick on her stomach she is ready to come off of it. Pt states it is hard to eat. Please advise

## 2014-06-13 NOTE — Telephone Encounter (Signed)
Spoke to pt, told her stool specimen came back positive for C-Diff. Need to complete Flagyl antibiotic per Dr. Raliegh Ip. Pt verbalized understanding and stated having a hard time eating but is feeling better today. Told pt to continue bland diet and increase diet as tolerate . Pt verbalized understanding.

## 2014-06-13 NOTE — Telephone Encounter (Signed)
Butch Penny I am forwarding this message to you.  Dr. Raliegh Ip saw this pt.  I didn't she stool test results in her chart.  Can you call her to follow up  Thank you

## 2014-06-16 ENCOUNTER — Ambulatory Visit: Payer: Commercial Managed Care - HMO | Admitting: Internal Medicine

## 2014-07-04 ENCOUNTER — Other Ambulatory Visit: Payer: Self-pay | Admitting: Internal Medicine

## 2014-07-24 ENCOUNTER — Encounter: Payer: Self-pay | Admitting: Internal Medicine

## 2014-07-24 ENCOUNTER — Ambulatory Visit (INDEPENDENT_AMBULATORY_CARE_PROVIDER_SITE_OTHER): Payer: PPO | Admitting: Internal Medicine

## 2014-07-24 VITALS — BP 110/70 | HR 80 | Ht 59.25 in | Wt 101.8 lb

## 2014-07-24 DIAGNOSIS — I080 Rheumatic disorders of both mitral and aortic valves: Secondary | ICD-10-CM

## 2014-07-24 DIAGNOSIS — E785 Hyperlipidemia, unspecified: Secondary | ICD-10-CM | POA: Diagnosis not present

## 2014-07-24 NOTE — Progress Notes (Signed)
Cardiology Office Note   Date:  07/24/2014   ID:  Jennifer Donaldson, DOB 1932/05/14, MRN [id number]  PCP:  Drema Pry, DO  Cardiologist:   Dorris Carnes, MD      History of Present Illness: Jennifer Donaldson is a 81 y.o. female with a history of CAD  She was previously followed by Lowella Dell  She saw him in 2013 She is s/p stenting([place]) of LAD and RCA in past.  Jaw pain was angina.  Last nuclear testing in 2013 was normal .   Since then she has done well from a cardiac standpoitn  Very active helping with husband [name]  No signif SOB DId have a URI this winter  Rx with Amoxicllin  After that got diarrhea  Rx for C Diff  Still not right.       Current Outpatient Prescriptions  Medication Sig Dispense Refill  . alendronate (FOSAMAX) 70 MG tablet Take 70 mg by mouth once a week.  11  . amLODipine (NORVASC) 2.5 MG tablet Take 1 tablet (2.5 mg total) by mouth daily. 90 tablet 1  . aspirin 81 MG tablet Take 81 mg by mouth daily.      Marland Kitchen atorvastatin (LIPITOR) 10 MG tablet TAKE 1 TABLET (10 MG TOTAL) BY MOUTH DAILY. 90 tablet 3  . calcium citrate-vitamin D (CITRACAL+D) 315-200 MG-UNIT per tablet Take 2 tablets by mouth daily.     . chlorhexidine (PERIDEX) 0.12 % solution Twice daily.    . Cholecalciferol (VITAMIN D3) 1000 UNITS CAPS Take 1 tablet by mouth daily.       No current facility-administered medications for this visit.    Allergies:   Codeine sulfate   Past Medical History  Diagnosis Date  . Mitral regurgitation   . CAD (coronary artery disease)   . Hyperlipidemia   . Raynaud's disease   . Osteopenia   . Special screening for malignant neoplasm of colon   . C. difficile colitis     Past Surgical History  Procedure Laterality Date  . Coronary stent placement      2 prior drug eluting stent placed in the LAD and right coronary artery      Social History:  The patient  reports that she quit smoking about 30 years ago. Her smoking use included  Cigarettes. She has a 10 pack-year smoking history. She does not have any smokeless tobacco history on file.   Family History:  The patient's family history includes CAD in her brother and mother; Stroke in her father.    ROS:  Please see the history of present illness. All other systems are reviewed and  Negative to the above problem except as noted.    PHYSICAL EXAM: VS:  BP 110/70 mmHg  Pulse 80  Ht 4' 11.25" (1.505 m)  Wt 101 lb 12.8 oz (46.176 kg)  BMI 20.39 kg/m2  GEN: Well nourished, well developed, in no acute distress HEENT: normal Neck: no JVD, carotid bruits, or masses Cardiac: RRR; no murmurs, rubs, or gallops,no edema  Respiratory:  clear to auscultation bilaterally, normal work of breathing GI: soft, nontender, nondistended, + BS  No hepatomegaly  MS: no deformity Moving all extremities   Skin: warm and dry, no rash Neuro:  Strength and sensation are intact Psych: euthymic mood, full affect   EKG:  EKG is ordered today.  SR 80 bpm     Lipid Panel    Component Value Date/Time   CHOL 174 08/02/2013 0949  TRIG 56.0 08/02/2013 0949   HDL 88.80 08/02/2013 0949   CHOLHDL 2 08/02/2013 0949   VLDL 11.2 08/02/2013 0949   LDLCALC 74 08/02/2013 0949   LDLDIRECT 124.7 07/20/2012 0958      Wt Readings from Last 3 Encounters:  07/24/14 101 lb 12.8 oz (46.176 kg)  06/09/14 100 lb (45.36 kg)  05/30/14 100 lb (45.36 kg)      ASSESSMENT AND PLAN: 1  CAD  Doing well  No anginal symptoms    2  HL  Will set up fasting lipids    3  HTN  Good control    4.  GI  Instructed to take Align   Current medicines are reviewed at length with the patient today.  The patient does not have concerns regarding medicines.  The following changes have been made: None  Other than Aligmn  Labs/ tests ordered today include:  Lipids   No orders of the defined types were placed in this encounter.     Disposition:   FU with  in   Signed, Dorris Carnes, MD  07/24/2014 8:05 AM      [facility] [address], [place], [place]  [address] Phone: [phone]; Fax: [phone]

## 2014-07-24 NOTE — Patient Instructions (Signed)
Your physician recommends that you continue on your current medications as directed. Please refer to the Current Medication list given to you today. Your physician wants you to follow-up in 1 year with Dr. Harrington Challenger.  You will receive a reminder letter in the mail two months in advance. If you don't receive a letter, please call our office to schedule the follow-up appointment.  Your physician recommends that you have your lipids drawn when you return to your PCP.  Please request that Dr. Harrington Challenger get a copy of the results.

## 2014-08-04 ENCOUNTER — Ambulatory Visit: Payer: Commercial Managed Care - HMO | Admitting: Internal Medicine

## 2014-08-04 ENCOUNTER — Ambulatory Visit: Payer: PPO | Admitting: Internal Medicine

## 2014-08-07 ENCOUNTER — Ambulatory Visit: Payer: PPO | Admitting: Internal Medicine

## 2014-08-23 ENCOUNTER — Ambulatory Visit: Payer: PPO | Admitting: Internal Medicine

## 2014-08-24 ENCOUNTER — Other Ambulatory Visit: Payer: Self-pay | Admitting: Internal Medicine

## 2014-08-30 ENCOUNTER — Ambulatory Visit: Payer: PPO | Admitting: Adult Health

## 2014-08-30 ENCOUNTER — Ambulatory Visit (INDEPENDENT_AMBULATORY_CARE_PROVIDER_SITE_OTHER): Payer: PPO | Admitting: Adult Health

## 2014-08-30 ENCOUNTER — Encounter: Payer: Self-pay | Admitting: Adult Health

## 2014-08-30 ENCOUNTER — Ambulatory Visit: Payer: PPO | Admitting: Internal Medicine

## 2014-08-30 VITALS — BP 118/80 | Temp 97.5°F | Ht 59.25 in | Wt 102.1 lb

## 2014-08-30 DIAGNOSIS — Z09 Encounter for follow-up examination after completed treatment for conditions other than malignant neoplasm: Secondary | ICD-10-CM

## 2014-08-30 DIAGNOSIS — E785 Hyperlipidemia, unspecified: Secondary | ICD-10-CM | POA: Diagnosis not present

## 2014-08-30 LAB — LIPID PANEL
Cholesterol: 196 mg/dL (ref 0–200)
HDL: 95.5 mg/dL (ref >39.00)
LDL Cholesterol: 90 mg/dL (ref 0–99)
NonHDL: 100.5
Total CHOL/HDL Ratio: 2
Triglycerides: 53 mg/dL (ref 0.0–149.0)
VLDL: 10.6 mg/dL (ref 0.0–40.0)

## 2014-08-30 NOTE — Progress Notes (Signed)
   Subjective:    Patient ID: Jennifer Donaldson, female    DOB: 02-Mar-1933, 81 y.o.   MRN: [id number]  HPI  Patient presents to the office for five month follow up regarding C. Diff infection after ABX use. No problems with diarrhea, abdominal pain or nausea since finishing the Flagyl. Her only complaint is that her stools are not as formed as they were before the infection.    She does not endorses any other complaints.   Review of Systems  Gastrointestinal: Negative for nausea, vomiting, abdominal pain, diarrhea, constipation, blood in stool, abdominal distention and rectal pain.  All other systems reviewed and are negative.      Objective:   Physical Exam  Constitutional: She is oriented to person, place, and time. She appears well-developed and well-nourished. No distress.  Abdominal: Soft. Bowel sounds are normal. She exhibits no distension and no mass. There is no tenderness. There is no rebound and no guarding.  Neurological: She is alert and oriented to person, place, and time.  Skin: Skin is warm and dry. No rash noted. She is not diaphoretic. No erythema. No pallor.  Psychiatric: She has a normal mood and affect. Her behavior is normal. Judgment and thought content normal.  Nursing note and vitals reviewed.      Assessment & Plan:  1. Follow up - Follow up if any diarrhea, nausea, vomiting.  - Will inform patient of Lipid profile results.

## 2014-08-30 NOTE — Progress Notes (Signed)
Pre visit review using our clinic review tool, if applicable. No additional management support is needed unless otherwise documented below in the visit note. 

## 2014-08-30 NOTE — Patient Instructions (Signed)
It was great meeting you today. It appears as though you no longer have a c.diff infection. It is possible to have a reactivation of the C.Diff, follow up with [place] if you have diarrhea, vomiting, or adbominal cramping.   As discussed, Boost makes a high calorie protein drink. You can try this to help gain back your weight.   Let me know if you need anything.

## 2014-09-05 ENCOUNTER — Other Ambulatory Visit: Payer: Self-pay | Admitting: *Deleted

## 2014-09-05 DIAGNOSIS — E785 Hyperlipidemia, unspecified: Secondary | ICD-10-CM

## 2014-09-05 MED ORDER — ATORVASTATIN CALCIUM 20 MG PO TABS: 20.0000 mg | ORAL_TABLET | Freq: Every day | ORAL | Status: DC

## 2014-11-09 ENCOUNTER — Other Ambulatory Visit (INDEPENDENT_AMBULATORY_CARE_PROVIDER_SITE_OTHER): Payer: PPO | Admitting: *Deleted

## 2014-11-09 DIAGNOSIS — E785 Hyperlipidemia, unspecified: Secondary | ICD-10-CM

## 2014-11-09 LAB — LIPID PANEL
Cholesterol: 173 mg/dL (ref 0–200)
HDL: 82.9 mg/dL (ref >39.00)
LDL Cholesterol: 77 mg/dL (ref 0–99)
NonHDL: 89.83
Total CHOL/HDL Ratio: 2
Triglycerides: 64 mg/dL (ref 0.0–149.0)
VLDL: 12.8 mg/dL (ref 0.0–40.0)

## 2014-11-09 LAB — AST: AST: 27 U/L (ref 0–37)

## 2014-11-13 ENCOUNTER — Telehealth: Payer: Self-pay | Admitting: Internal Medicine

## 2014-11-13 NOTE — Telephone Encounter (Signed)
New message    Pt returning call regarding test results. Please call before 10:30 or after 1:00

## 2014-11-13 NOTE — Telephone Encounter (Signed)
**Note De-Identified Daemon Dowty Obfuscation** The pt is advised of her recent lab results and she verbalized understanding.

## 2014-12-29 ENCOUNTER — Ambulatory Visit (INDEPENDENT_AMBULATORY_CARE_PROVIDER_SITE_OTHER): Payer: PPO | Admitting: *Deleted

## 2014-12-29 ENCOUNTER — Other Ambulatory Visit: Payer: Self-pay | Admitting: Adult Health

## 2014-12-29 DIAGNOSIS — Z23 Encounter for immunization: Secondary | ICD-10-CM

## 2014-12-29 MED ORDER — ALPRAZOLAM 0.25 MG PO TABS: 0.2500 mg | ORAL_TABLET | Freq: Two times a day (BID) | ORAL | Status: DC | PRN

## 2015-01-03 ENCOUNTER — Telehealth: Payer: Self-pay | Admitting: Adult Health

## 2015-01-03 NOTE — Telephone Encounter (Signed)
Pt called to ask if she had to take her xanax 2X/day. Instructions states 1 / bid prn. Pt verbalized understanding.

## 2015-02-15 ENCOUNTER — Other Ambulatory Visit: Payer: Self-pay | Admitting: Internal Medicine

## 2015-02-19 ENCOUNTER — Other Ambulatory Visit: Payer: Self-pay | Admitting: Adult Health

## 2015-02-20 NOTE — Telephone Encounter (Signed)
Ok to refill 

## 2015-02-20 NOTE — Telephone Encounter (Signed)
Ok to renew for one month  

## 2015-03-15 ENCOUNTER — Other Ambulatory Visit: Payer: Self-pay

## 2015-03-15 DIAGNOSIS — Z1231 Encounter for screening mammogram for malignant neoplasm of breast: Secondary | ICD-10-CM

## 2015-04-02 ENCOUNTER — Telehealth: Payer: Self-pay

## 2015-04-02 MED ORDER — ALPRAZOLAM 0.25 MG PO TABS: 0.2500 mg | ORAL_TABLET | Freq: Two times a day (BID) | ORAL | Status: DC | PRN

## 2015-04-02 NOTE — Telephone Encounter (Signed)
Pt request a refill of alprazolam 0.25 mg tablets.  Pt's pharmacy is [facility] [address]. Ok per [place] to refill medication.  Rx sent to pharmacy.

## 2015-04-18 ENCOUNTER — Ambulatory Visit
Admission: RE | Admit: 2015-04-18 | Discharge: 2015-04-18 | Disposition: A | Discharge status: Home / Self Care | Payer: PPO | Source: Ambulatory Visit

## 2015-04-18 DIAGNOSIS — Z1231 Encounter for screening mammogram for malignant neoplasm of breast: Secondary | ICD-10-CM

## 2015-05-22 ENCOUNTER — Other Ambulatory Visit: Payer: Self-pay | Admitting: Adult Health

## 2015-05-22 NOTE — Telephone Encounter (Signed)
Ok per [place] to refill for 1 month.

## 2015-06-11 DIAGNOSIS — N952 Postmenopausal atrophic vaginitis: Secondary | ICD-10-CM | POA: Diagnosis not present

## 2015-06-11 DIAGNOSIS — M810 Age-related osteoporosis without current pathological fracture: Secondary | ICD-10-CM | POA: Diagnosis not present

## 2015-07-09 DIAGNOSIS — M810 Age-related osteoporosis without current pathological fracture: Secondary | ICD-10-CM | POA: Diagnosis not present

## 2015-08-08 DIAGNOSIS — H0012 Chalazion right lower eyelid: Secondary | ICD-10-CM | POA: Diagnosis not present

## 2015-08-10 ENCOUNTER — Ambulatory Visit: Payer: PPO | Admitting: Internal Medicine

## 2015-08-12 ENCOUNTER — Other Ambulatory Visit: Payer: Self-pay | Admitting: Internal Medicine

## 2015-08-15 DIAGNOSIS — H0012 Chalazion right lower eyelid: Secondary | ICD-10-CM | POA: Diagnosis not present

## 2015-08-15 DIAGNOSIS — H43813 Vitreous degeneration, bilateral: Secondary | ICD-10-CM | POA: Diagnosis not present

## 2015-08-15 DIAGNOSIS — H524 Presbyopia: Secondary | ICD-10-CM | POA: Diagnosis not present

## 2015-08-17 ENCOUNTER — Other Ambulatory Visit: Payer: PPO

## 2015-08-21 ENCOUNTER — Encounter: Payer: Self-pay | Admitting: Adult Health

## 2015-08-21 ENCOUNTER — Ambulatory Visit (INDEPENDENT_AMBULATORY_CARE_PROVIDER_SITE_OTHER): Payer: PPO | Admitting: Adult Health

## 2015-08-21 ENCOUNTER — Other Ambulatory Visit (INDEPENDENT_AMBULATORY_CARE_PROVIDER_SITE_OTHER): Payer: PPO

## 2015-08-21 VITALS — BP 112/60 | Temp 97.6°F | Wt 104.9 lb

## 2015-08-21 DIAGNOSIS — Z0000 Encounter for general adult medical examination without abnormal findings: Secondary | ICD-10-CM | POA: Diagnosis not present

## 2015-08-21 DIAGNOSIS — E785 Hyperlipidemia, unspecified: Secondary | ICD-10-CM

## 2015-08-21 DIAGNOSIS — Z0001 Encounter for general adult medical examination with abnormal findings: Secondary | ICD-10-CM

## 2015-08-21 DIAGNOSIS — H6121 Impacted cerumen, right ear: Secondary | ICD-10-CM | POA: Diagnosis not present

## 2015-08-21 LAB — HEPATIC FUNCTION PANEL
ALT: 17 U/L (ref 0–35)
AST: 27 U/L (ref 0–37)
Albumin: 4.5 g/dL (ref 3.5–5.2)
Alkaline Phosphatase: 57 U/L (ref 39–117)
Bilirubin, Direct: 0.1 mg/dL (ref 0.0–0.3)
Total Bilirubin: 0.5 mg/dL (ref 0.2–1.2)
Total Protein: 7.4 g/dL (ref 6.0–8.3)

## 2015-08-21 LAB — POC URINALSYSI DIPSTICK (AUTOMATED)
Bilirubin, UA: NEGATIVE
Blood, UA: NEGATIVE
Glucose, UA: NEGATIVE
Ketones, UA: NEGATIVE
Leukocytes, UA: NEGATIVE
Nitrite, UA: NEGATIVE
Protein, UA: NEGATIVE
Spec Grav, UA: 1.025
Urobilinogen, UA: 0.2
pH, UA: 5.5

## 2015-08-21 LAB — LIPID PANEL
Cholesterol: 191 mg/dL (ref 0–200)
HDL: 95 mg/dL (ref >39.00)
LDL Cholesterol: 80 mg/dL (ref 0–99)
NonHDL: 95.73
Total CHOL/HDL Ratio: 2
Triglycerides: 77 mg/dL (ref 0.0–149.0)
VLDL: 15.4 mg/dL (ref 0.0–40.0)

## 2015-08-21 LAB — CBC WITH DIFFERENTIAL/PLATELET
Basophils Absolute: 0.1 10*3/uL (ref 0.0–0.1)
Basophils Relative: 1.4 % (ref 0.0–3.0)
Eosinophils Absolute: 0.3 10*3/uL (ref 0.0–0.7)
Eosinophils Relative: 4.4 % (ref 0.0–5.0)
HCT: 40 % (ref 36.0–46.0)
Hemoglobin: 13.3 g/dL (ref 12.0–15.0)
Lymphocytes Relative: 25.6 % (ref 12.0–46.0)
Lymphs Abs: 1.9 10*3/uL (ref 0.7–4.0)
MCHC: 33.3 g/dL (ref 30.0–36.0)
MCV: 90.3 fl (ref 78.0–100.0)
Monocytes Absolute: 0.8 10*3/uL (ref 0.1–1.0)
Monocytes Relative: 11.1 % (ref 3.0–12.0)
Neutro Abs: 4.4 10*3/uL (ref 1.4–7.7)
Neutrophils Relative %: 57.5 % (ref 43.0–77.0)
Platelets: 356 10*3/uL (ref 150.0–400.0)
RBC: 4.43 Mil/uL (ref 3.87–5.11)
RDW: 14.1 % (ref 11.5–15.5)
WBC: 7.6 10*3/uL (ref 4.0–10.5)

## 2015-08-21 LAB — BASIC METABOLIC PANEL
BUN: 16 mg/dL (ref 6–23)
CO2: 30 mEq/L (ref 19–32)
Calcium: 9.9 mg/dL (ref 8.4–10.5)
Chloride: 100 mEq/L (ref 96–112)
Creatinine, Ser: 0.83 mg/dL (ref 0.40–1.20)
GFR: 70.1 mL/min (ref >60.00)
Glucose, Bld: 93 mg/dL (ref 70–99)
Potassium: 4.4 mEq/L (ref 3.5–5.1)
Sodium: 139 mEq/L (ref 135–145)

## 2015-08-21 LAB — TSH: TSH: 2.66 u[IU]/mL (ref 0.35–4.50)

## 2015-08-21 NOTE — Patient Instructions (Addendum)
It was great seeing you today!  Your exam is normal. I will follow up with you once your labs come back.   Follow up with me in one year or sooner if needed  Health Maintenance, Female Adopting a healthy lifestyle and getting preventive care can go a long way to promote health and wellness. Talk with your health care provider about what schedule of regular examinations is right for you. This is a good chance for you to check in with your provider about disease prevention and staying healthy. In between checkups, there are plenty of things you can do on your own. Experts have done a lot of research about which lifestyle changes and preventive measures are most likely to keep you healthy. Ask your health care provider for more information. WEIGHT AND DIET  Eat a healthy diet  Be sure to include plenty of vegetables, fruits, low-fat dairy products, and lean protein.  Do not eat a lot of foods high in solid fats, added sugars, or salt.  Get regular exercise. This is one of the most important things you can do for your health.  Most adults should exercise for at least 150 minutes each week. The exercise should increase your heart rate and make you sweat (moderate-intensity exercise).  Most adults should also do strengthening exercises at least twice a week. This is in addition to the moderate-intensity exercise.  Maintain a healthy weight  Body mass index (BMI) is a measurement that can be used to identify possible weight problems. It estimates body fat based on height and weight. Your health care provider can help determine your BMI and help you achieve or maintain a healthy weight.  For females 47 years of age and older:   A BMI below 18.5 is considered underweight.  A BMI of 18.5 to 24.9 is normal.  A BMI of 25 to 29.9 is considered overweight.  A BMI of 30 and above is considered obese.  Watch levels of cholesterol and blood lipids  You should start having your blood tested for  lipids and cholesterol at 80 years of age, then have this test every 5 years.  You may need to have your cholesterol levels checked more often if:  Your lipid or cholesterol levels are high.  You are older than 80 years of age.  You are at high risk for heart disease.  CANCER SCREENING   Lung Cancer  Lung cancer screening is recommended for adults 16-35 years old who are at high risk for lung cancer because of a history of smoking.  A yearly low-dose CT scan of the lungs is recommended for people who:  Currently smoke.  Have quit within the past 15 years.  Have at least a 30-pack-year history of smoking. A pack year is smoking an average of one pack of cigarettes a day for 1 year.  Yearly screening should continue until it has been 15 years since you quit.  Yearly screening should stop if you develop a health problem that would prevent you from having lung cancer treatment.  Breast Cancer  Practice breast self-awareness. This means understanding how your breasts normally appear and feel.  It also means doing regular breast self-exams. Let your health care provider know about any changes, no matter how small.  If you are in your 20s or 30s, you should have a clinical breast exam (CBE) by a health care provider every 1-3 years as part of a regular health exam.  If you are 40 or older,  have a CBE every year. Also consider having a breast X-ray (mammogram) every year.  If you have a family history of breast cancer, talk to your health care provider about genetic screening.  If you are at high risk for breast cancer, talk to your health care provider about having an MRI and a mammogram every year.  Breast cancer gene (BRCA) assessment is recommended for women who have family members with BRCA-related cancers. BRCA-related cancers include:  Breast.  Ovarian.  Tubal.  Peritoneal cancers.  Results of the assessment will determine the need for genetic counseling and BRCA1  and BRCA2 testing. Cervical Cancer Your health care provider may recommend that you be screened regularly for cancer of the pelvic organs (ovaries, uterus, and vagina). This screening involves a pelvic examination, including checking for microscopic changes to the surface of your cervix (Pap test). You may be encouraged to have this screening done every 3 years, beginning at age 80.  For women ages 34-65, health care providers may recommend pelvic exams and Pap testing every 3 years, or they may recommend the Pap and pelvic exam, combined with testing for human papilloma virus (HPV), every 5 years. Some types of HPV increase your risk of cervical cancer. Testing for HPV may also be done on women of any age with unclear Pap test results.  Other health care providers may not recommend any screening for nonpregnant women who are considered low risk for pelvic cancer and who do not have symptoms. Ask your health care provider if a screening pelvic exam is right for you.  If you have had past treatment for cervical cancer or a condition that could lead to cancer, you need Pap tests and screening for cancer for at least 20 years after your treatment. If Pap tests have been discontinued, your risk factors (such as having a new sexual partner) need to be reassessed to determine if screening should resume. Some women have medical problems that increase the chance of getting cervical cancer. In these cases, your health care provider may recommend more frequent screening and Pap tests. Colorectal Cancer  This type of cancer can be detected and often prevented.  Routine colorectal cancer screening usually begins at 80 years of age and continues through 80 years of age.  Your health care provider may recommend screening at an earlier age if you have risk factors for colon cancer.  Your health care provider may also recommend using home test kits to check for hidden blood in the stool.  A small camera at the  end of a tube can be used to examine your colon directly (sigmoidoscopy or colonoscopy). This is done to check for the earliest forms of colorectal cancer.  Routine screening usually begins at age 33.  Direct examination of the colon should be repeated every 5-10 years through 80 years of age. However, you may need to be screened more often if early forms of precancerous polyps or small growths are found. Skin Cancer  Check your skin from head to toe regularly.  Tell your health care provider about any new moles or changes in moles, especially if there is a change in a mole's shape or color.  Also tell your health care provider if you have a mole that is larger than the size of a pencil eraser.  Always use sunscreen. Apply sunscreen liberally and repeatedly throughout the day.  Protect yourself by wearing long sleeves, pants, a wide-brimmed hat, and sunglasses whenever you are outside. HEART DISEASE, DIABETES, AND  HIGH BLOOD PRESSURE   High blood pressure causes heart disease and increases the risk of stroke. High blood pressure is more likely to develop in:  People who have blood pressure in the high end of the normal range (130-139/85-89 mm Hg).  People who are overweight or obese.  People who are African American.  If you are 18-39 years of age, have your blood pressure checked every 3-5 years. If you are 40 years of age or older, have your blood pressure checked every year. You should have your blood pressure measured twice--once when you are at a hospital or clinic, and once when you are not at a hospital or clinic. Record the average of the two measurements. To check your blood pressure when you are not at a hospital or clinic, you can use:  An automated blood pressure machine at a pharmacy.  A home blood pressure monitor.  If you are between 55 years and 79 years old, ask your health care provider if you should take aspirin to prevent strokes.  Have regular diabetes  screenings. This involves taking a blood sample to check your fasting blood sugar level.  If you are at a normal weight and have a low risk for diabetes, have this test once every three years after 80 years of age.  If you are overweight and have a high risk for diabetes, consider being tested at a younger age or more often. PREVENTING INFECTION  Hepatitis B  If you have a higher risk for hepatitis B, you should be screened for this virus. You are considered at high risk for hepatitis B if:  You were born in a country where hepatitis B is common. Ask your health care provider which countries are considered high risk.  Your parents were born in a high-risk country, and you have not been immunized against hepatitis B (hepatitis B vaccine).  You have HIV or AIDS.  You use needles to inject street drugs.  You live with someone who has hepatitis B.  You have had sex with someone who has hepatitis B.  You get hemodialysis treatment.  You take certain medicines for conditions, including cancer, organ transplantation, and autoimmune conditions. Hepatitis C  Blood testing is recommended for:  Everyone born from 1945 through 1965.  Anyone with known risk factors for hepatitis C. Sexually transmitted infections (STIs)  You should be screened for sexually transmitted infections (STIs) including gonorrhea and chlamydia if:  You are sexually active and are younger than 80 years of age.  You are older than 80 years of age and your health care provider tells you that you are at risk for this type of infection.  Your sexual activity has changed since you were last screened and you are at an increased risk for chlamydia or gonorrhea. Ask your health care provider if you are at risk.  If you do not have HIV, but are at risk, it may be recommended that you take a prescription medicine daily to prevent HIV infection. This is called pre-exposure prophylaxis (PrEP). You are considered at risk  if:  You are sexually active and do not regularly use condoms or know the HIV status of your partner(s).  You take drugs by injection.  You are sexually active with a partner who has HIV. Talk with your health care provider about whether you are at high risk of being infected with HIV. If you choose to begin PrEP, you should first be tested for HIV. You should then be tested   every 3 months for as long as you are taking PrEP.  PREGNANCY   If you are premenopausal and you may become pregnant, ask your health care provider about preconception counseling.  If you may become pregnant, take 400 to 800 micrograms (mcg) of folic acid every day.  If you want to prevent pregnancy, talk to your health care provider about birth control (contraception). OSTEOPOROSIS AND MENOPAUSE   Osteoporosis is a disease in which the bones lose minerals and strength with aging. This can result in serious bone fractures. Your risk for osteoporosis can be identified using a bone density scan.  If you are 65 years of age or older, or if you are at risk for osteoporosis and fractures, ask your health care provider if you should be screened.  Ask your health care provider whether you should take a calcium or vitamin D supplement to lower your risk for osteoporosis.  Menopause may have certain physical symptoms and risks.  Hormone replacement therapy may reduce some of these symptoms and risks. Talk to your health care provider about whether hormone replacement therapy is right for you.  HOME CARE INSTRUCTIONS   Schedule regular health, dental, and eye exams.  Stay current with your immunizations.   Do not use any tobacco products including cigarettes, chewing tobacco, or electronic cigarettes.  If you are pregnant, do not drink alcohol.  If you are breastfeeding, limit how much and how often you drink alcohol.  Limit alcohol intake to no more than 1 drink per day for nonpregnant women. One drink equals 12  ounces of beer, 5 ounces of wine, or 1 ounces of hard liquor.  Do not use street drugs.  Do not share needles.  Ask your health care provider for help if you need support or information about quitting drugs.  Tell your health care provider if you often feel depressed.  Tell your health care provider if you have ever been abused or do not feel safe at home.   This information is not intended to replace advice given to you by your health care provider. Make sure you discuss any questions you have with your health care provider.   Document Released: 10/14/2010 Document Revised: 04/21/2014 Document Reviewed: 03/02/2013 Elsevier Interactive Patient Education 2016 [employer] Inc[employer]  

## 2015-08-21 NOTE — Progress Notes (Signed)
Subjective:    Patient ID: Jennifer Donaldson, female    DOB: 06-15-1932, 82 y.o.   MRN: [id number]  HPI Patient presents for yearly preventative medicine examination. Medicare questionnaire was completed  All immunizations and health maintenance protocols were reviewed with the patient and needed orders were placed.  Medication reconciliation,  past medical history, social history, problem list and allergies were reviewed in detail with the patient  Goals were established with regard to weight loss, exercise, and  diet in compliance with medications  End of life planning was discussed.  She has not been exercising as she would like to due to having to take care of her husband. She continues to eat healthy   Review of Systems  Constitutional: Negative.   HENT: Negative.   Eyes: Negative.   Respiratory: Negative.   Cardiovascular: Negative.   Gastrointestinal: Negative.   Endocrine: Negative.   Genitourinary: Negative.   Musculoskeletal: Negative.   Skin: Negative.   Allergic/Immunologic: Negative.   Neurological: Negative.   Hematological: Negative.   Psychiatric/Behavioral: Negative.   All other systems reviewed and are negative.  Past Medical History  Diagnosis Date  . Mitral regurgitation   . CAD (coronary artery disease)   . Hyperlipidemia   . Raynaud's disease   . Osteopenia   . Special screening for malignant neoplasm of colon   . C. difficile colitis     Social History   Social History  . Marital Status: Married    Spouse Name: N/A  . Number of Children: N/A  . Years of Education: N/A   Occupational History  . Not on file.   Social History Main Topics  . Smoking status: Former Smoker -- 0.50 packs/day for 20 years    Types: Cigarettes    Quit date: 09/01/1983  . Smokeless tobacco: Not on file  . Alcohol Use: Not on file  . Drug Use: Not on file  . Sexual Activity: Not on file   Other Topics Concern  . Not on file   Social History  Narrative   Retired Engineer, structural   Married for 58 years - husband has multiple medical issues and is disabled   4 children   Grew up in [place]    Past Surgical History  Procedure Laterality Date  . Coronary stent placement      2 prior drug eluting stent placed in the LAD and right coronary artery     Family History  Problem Relation Age of Onset  . CAD Brother   . Stroke Father   . CAD Mother     Allergies  Allergen Reactions  . Codeine Sulfate     REACTION: sensitivity to    Current Outpatient Prescriptions on File Prior to Visit  Medication Sig Dispense Refill  . alendronate (FOSAMAX) 70 MG tablet Take 70 mg by mouth once a week.  11  . ALPRAZolam (XANAX) 0.25 MG tablet TAKE 1 TABLET BY MOUTH TWICE A DAY AS NEEDED FOR ANXIETY 60 tablet 0  . amLODipine (NORVASC) 2.5 MG tablet TAKE 1 TABLET BY MOUTH DAILY 90 tablet 0  . aspirin 81 MG tablet Take 81 mg by mouth daily.      Marland Kitchen atorvastatin (LIPITOR) 20 MG tablet Take 1 tablet (20 mg total) by mouth daily at 6 PM. 90 tablet 3  . calcium citrate-vitamin D (CITRACAL+D) 315-200 MG-UNIT per tablet Take 2 tablets by mouth daily.     . chlorhexidine (PERIDEX) 0.12 % solution Twice daily.    Marland Kitchen  Cholecalciferol (VITAMIN D3) 1000 UNITS CAPS Take 1 tablet by mouth daily.       No current facility-administered medications on file prior to visit.    BP 112/60 mmHg  Temp(Src) 97.6 F (36.4 C) (Oral)  Wt 104 lb 14.4 oz (47.582 kg)       Objective:   Physical Exam  Constitutional: She is oriented to person, place, and time. She appears well-developed and well-nourished. No distress.  HENT:  Head: Normocephalic and atraumatic.  Right Ear: External ear normal.  Left Ear: External ear normal.  Nose: Nose normal.  Mouth/Throat: Oropharynx is clear and moist. No oropharyngeal exudate.  Cerumen impaction in right ear  Eyes: Conjunctivae and EOM are normal. Pupils are equal, round, and reactive to light. Right eye  exhibits no discharge. Left eye exhibits no discharge. No scleral icterus.  Neck: Normal range of motion. Neck supple. No JVD present. No tracheal deviation present. No thyromegaly present.  Cardiovascular: Normal rate, regular rhythm, normal heart sounds and intact distal pulses.  Exam reveals no gallop and no friction rub.   No murmur heard. Pulmonary/Chest: Effort normal and breath sounds normal. No stridor. No respiratory distress. She has no wheezes. She has no rales. She exhibits no tenderness.  Abdominal: Soft. Bowel sounds are normal. She exhibits no distension and no mass. There is no tenderness. There is no rebound and no guarding.  Musculoskeletal: Normal range of motion. She exhibits no edema or tenderness.  Lymphadenopathy:    She has no cervical adenopathy.  Neurological: She is alert and oriented to person, place, and time. She has normal reflexes. She displays normal reflexes. No cranial nerve deficit. She exhibits normal muscle tone. Coordination normal.  Skin: Skin is warm and dry. No rash noted. She is not diaphoretic. No erythema. No pallor.  Psychiatric: She has a normal mood and affect. Her behavior is normal. Judgment and thought content normal.  Nursing note and vitals reviewed.     Assessment & Plan:  1. Routine general medical examination at a health care facility - Labs were draw earlier this morning. Results have not posted yet.   2. Dyslipidemia - Controlled with current medications  3. Cerumen impaction, right Cerumen impaction was dislodged with irrigation and instrument removal.  - patient endorsed being able to hear better   Dorothyann Peng

## 2015-09-16 ENCOUNTER — Other Ambulatory Visit: Payer: Self-pay | Admitting: Internal Medicine

## 2015-10-05 ENCOUNTER — Ambulatory Visit (INDEPENDENT_AMBULATORY_CARE_PROVIDER_SITE_OTHER): Payer: PPO | Admitting: Internal Medicine

## 2015-10-05 ENCOUNTER — Encounter: Payer: Self-pay | Admitting: Internal Medicine

## 2015-10-05 VITALS — BP 136/68 | HR 82 | Ht 59.0 in | Wt 104.1 lb

## 2015-10-05 DIAGNOSIS — R0602 Shortness of breath: Secondary | ICD-10-CM | POA: Diagnosis not present

## 2015-10-05 NOTE — Progress Notes (Signed)
Cardiology Office Note   Date:  10/05/2015   ID:  Neville, Aguero 07-20-1932, MRN [id number]  PCP:  Dorothyann Peng, NP  Cardiologist:   Dorris Carnes, MD   No chief complaint on file. F?U of CAD      History of Present Illness: Jennifer Donaldson is a 82 y.o. female with a history of CAD  She wa previously seen by Lowella Dell  I saw her in April 2016 Pt is s/p PCI/DES to LAD and RCA  Nuclear scan in 2013 was normal    Since seen she deniese CP  Does say that she gives out easily  Wonders if it is because of increased stress with caring for husband     Lipids in May LDL was 80    Outpatient Prescriptions Prior to Visit  Medication Sig Dispense Refill  . alendronate (FOSAMAX) 70 MG tablet Take 70 mg by mouth once a week.  11  . ALPRAZolam (XANAX) 0.25 MG tablet TAKE 1 TABLET BY MOUTH TWICE A DAY AS NEEDED FOR ANXIETY 60 tablet 0  . amLODipine (NORVASC) 2.5 MG tablet TAKE 1 TABLET BY MOUTH DAILY 90 tablet 0  . aspirin 81 MG tablet Take 81 mg by mouth daily.      Marland Kitchen atorvastatin (LIPITOR) 20 MG tablet TAKE 1 TABLET BY MOUTH EVERY DAY AT 6PM 90 tablet 0  . calcium citrate-vitamin D (CITRACAL+D) 315-200 MG-UNIT per tablet Take 2 tablets by mouth daily.     . chlorhexidine (PERIDEX) 0.12 % solution Use as directed 5 mLs in the mouth or throat at bedtime as needed (when patient remembers).     . Cholecalciferol (VITAMIN D3) 1000 UNITS CAPS Take 1 tablet by mouth daily.       No facility-administered medications prior to visit.     Allergies:   Codeine sulfate   Past Medical History  Diagnosis Date  . Mitral regurgitation   . CAD (coronary artery disease)   . Hyperlipidemia   . Raynaud's disease   . Osteopenia   . Special screening for malignant neoplasm of colon   . C. difficile colitis     Past Surgical History  Procedure Laterality Date  . Coronary stent placement      2 prior drug eluting stent placed in the LAD and right coronary artery      Social  History:  The patient  reports that she quit smoking about 32 years ago. Her smoking use included Cigarettes. She has a 10 pack-year smoking history. She does not have any smokeless tobacco history on file.   Family History:  The patient's family history includes CAD in her brother and mother; Stroke in her father.    ROS:  Please see the history of present illness. All other systems are reviewed and  Negative to the above problem except as noted.    PHYSICAL EXAM: VS:  BP 136/68 mmHg  Pulse 82  Ht 4\' 11"  (1.499 m)  Wt 104 lb 1.9 oz (47.229 kg)  BMI 21.02 kg/m2  GEN: Well nourished, well developed, in no acute distress HEENT: normal Neck: no JVD, carotid bruits, or masses Cardiac: RRR; no murmurs, rubs, or gallops,no edema  Respiratory:  clear to auscultation bilaterally, normal work of breathing GI: soft, nontender, nondistended, + BS  No hepatomegaly  MS: no deformity Moving all extremities   Skin: warm and dry, no rash Neuro:  Strength and sensation are intact Psych: euthymic mood, full affect   EKG:  EKG  is ordered today.  SR 82 bpm     Lipid Panel    Component Value Date/Time   CHOL 191 08/21/2015 0950   TRIG 77.0 08/21/2015 0950   HDL 95.00 08/21/2015 0950   CHOLHDL 2 08/21/2015 0950   VLDL 15.4 08/21/2015 0950   LDLCALC 80 08/21/2015 0950   LDLDIRECT 124.7 07/20/2012 0958      Wt Readings from Last 3 Encounters:  10/05/15 104 lb 1.9 oz (47.229 kg)  08/21/15 104 lb 14.4 oz (47.582 kg)  08/30/14 102 lb 1.6 oz (46.312 kg)      ASSESSMENT AND PLAN:  1  CAD Her symptoms of giving out with activity is concerning  Will set up for stress myoview to evaluate for ischemia   Continue meds  2.  HTN  Good control  3.  HL  Continue statin    Plan for f/u based on test results   If normal plan for f/u in 1 year      Signed, Dorris Carnes, MD  10/05/2015 9:57 AM    [address] [place], [place], [place]  [address] Phone: [phone];  Fax: [phone]

## 2015-10-05 NOTE — Patient Instructions (Addendum)
Medication Instructions:  Your physician recommends that you continue on your current medications as directed. Please refer to the Current Medication list given to you today.  Labwork: none  Testing/Procedures: Your physician has requested that you have en exercise stress myoview. For further information please visit [email or web address]. Please follow instruction sheet, as given.  Follow-Up: Your physician wants you to follow-up in: 1 year ov You will receive a reminder letter in the mail two months in advance. If you don't receive a letter, please call our office to schedule the follow-up appointment.  If you need a refill on your cardiac medications before your next appointment, please call your pharmacy.

## 2015-10-15 ENCOUNTER — Telehealth (HOSPITAL_COMMUNITY): Payer: Self-pay | Admitting: *Deleted

## 2015-10-15 NOTE — Telephone Encounter (Signed)
Attempted to contact patient in reference to nuclear study on 10/19/15. No answer and needed access code to leave message.Naliya Gish, Ranae Palms

## 2015-10-19 ENCOUNTER — Ambulatory Visit (HOSPITAL_COMMUNITY): Payer: PPO | Attending: Cardiovascular Disease

## 2015-10-19 DIAGNOSIS — R0602 Shortness of breath: Secondary | ICD-10-CM

## 2015-10-19 DIAGNOSIS — Z8249 Family history of ischemic heart disease and other diseases of the circulatory system: Secondary | ICD-10-CM | POA: Insufficient documentation

## 2015-10-19 DIAGNOSIS — R5383 Other fatigue: Secondary | ICD-10-CM | POA: Insufficient documentation

## 2015-10-19 DIAGNOSIS — I10 Essential (primary) hypertension: Secondary | ICD-10-CM | POA: Insufficient documentation

## 2015-10-19 LAB — MYOCARDIAL PERFUSION IMAGING
Estimated workload: 7 METS
Exercise duration (min): 6 min
Exercise duration (sec): 0 s
LV dias vol: 32 mL (ref 46–106)
LV sys vol: 6 mL
MPHR: 139 {beats}/min
Peak HR: 139 {beats}/min
Percent HR: 100 %
RATE: 0.31
Rest HR: 85 {beats}/min
SDS: 2
SRS: 0
SSS: 2
TID: 0.86

## 2015-10-19 MED ORDER — TECHNETIUM TC 99M TETROFOSMIN IV KIT
30.7000 | PACK | Freq: Once | INTRAVENOUS | Status: AC | PRN
  Administered 2015-10-19: 30.7 via INTRAVENOUS
  Filled 2015-10-19: qty 31

## 2015-10-19 MED ORDER — TECHNETIUM TC 99M TETROFOSMIN IV KIT
10.5000 | PACK | Freq: Once | INTRAVENOUS | Status: AC | PRN
  Administered 2015-10-19: 11 via INTRAVENOUS
  Filled 2015-10-19: qty 11

## 2015-11-08 ENCOUNTER — Other Ambulatory Visit: Payer: Self-pay | Admitting: Internal Medicine

## 2015-12-13 ENCOUNTER — Other Ambulatory Visit: Payer: Self-pay | Admitting: Internal Medicine

## 2016-01-15 ENCOUNTER — Other Ambulatory Visit: Payer: Self-pay | Admitting: Adult Health

## 2016-01-15 NOTE — Telephone Encounter (Signed)
Rx called in as directed.   

## 2016-01-15 NOTE — Telephone Encounter (Signed)
Ok to refill for one month  

## 2016-02-02 ENCOUNTER — Other Ambulatory Visit: Payer: Self-pay | Admitting: Adult Health

## 2016-02-06 ENCOUNTER — Ambulatory Visit (INDEPENDENT_AMBULATORY_CARE_PROVIDER_SITE_OTHER): Payer: PPO

## 2016-02-06 DIAGNOSIS — Z23 Encounter for immunization: Secondary | ICD-10-CM | POA: Diagnosis not present

## 2016-03-20 DIAGNOSIS — H5203 Hypermetropia, bilateral: Secondary | ICD-10-CM | POA: Diagnosis not present

## 2016-03-20 DIAGNOSIS — H524 Presbyopia: Secondary | ICD-10-CM | POA: Diagnosis not present

## 2016-05-01 ENCOUNTER — Other Ambulatory Visit: Payer: Self-pay | Admitting: Adult Health

## 2016-05-02 ENCOUNTER — Other Ambulatory Visit: Payer: Self-pay | Admitting: Adult Health

## 2016-05-02 MED ORDER — AMLODIPINE BESYLATE 2.5 MG PO TABS
2.5000 mg | ORAL_TABLET | Freq: Every day | ORAL | 3 refills | Status: DC
Start: 2016-05-02 — End: 2017-04-20

## 2016-05-27 ENCOUNTER — Other Ambulatory Visit: Payer: Self-pay | Admitting: Adult Health

## 2016-05-28 NOTE — Telephone Encounter (Signed)
Ok to refill 

## 2016-05-28 NOTE — Telephone Encounter (Signed)
Rx called in as directed.   

## 2016-08-26 ENCOUNTER — Encounter: Payer: PPO | Admitting: Adult Health

## 2016-09-02 ENCOUNTER — Other Ambulatory Visit: Payer: Self-pay | Admitting: Internal Medicine

## 2016-09-02 DIAGNOSIS — M810 Age-related osteoporosis without current pathological fracture: Secondary | ICD-10-CM | POA: Diagnosis not present

## 2016-09-02 DIAGNOSIS — Z01411 Encounter for gynecological examination (general) (routine) with abnormal findings: Secondary | ICD-10-CM | POA: Diagnosis not present

## 2016-09-02 DIAGNOSIS — N952 Postmenopausal atrophic vaginitis: Secondary | ICD-10-CM | POA: Diagnosis not present

## 2016-09-04 ENCOUNTER — Other Ambulatory Visit: Payer: Self-pay | Admitting: Obstetrics and Gynecology

## 2016-09-04 DIAGNOSIS — Z1231 Encounter for screening mammogram for malignant neoplasm of breast: Secondary | ICD-10-CM

## 2016-09-09 ENCOUNTER — Encounter: Payer: PPO | Admitting: Adult Health

## 2016-09-11 ENCOUNTER — Encounter: Payer: Self-pay | Admitting: Adult Health

## 2016-09-11 ENCOUNTER — Ambulatory Visit (INDEPENDENT_AMBULATORY_CARE_PROVIDER_SITE_OTHER): Payer: PPO | Admitting: Adult Health

## 2016-09-11 VITALS — BP 118/60 | Temp 97.7°F | Ht 59.0 in | Wt 104.4 lb

## 2016-09-11 DIAGNOSIS — E785 Hyperlipidemia, unspecified: Secondary | ICD-10-CM

## 2016-09-11 DIAGNOSIS — Z0000 Encounter for general adult medical examination without abnormal findings: Secondary | ICD-10-CM | POA: Diagnosis not present

## 2016-09-11 DIAGNOSIS — E559 Vitamin D deficiency, unspecified: Secondary | ICD-10-CM | POA: Diagnosis not present

## 2016-09-11 LAB — HEPATIC FUNCTION PANEL
ALT: 13 U/L (ref 0–35)
AST: 24 U/L (ref 0–37)
Albumin: 4.5 g/dL (ref 3.5–5.2)
Alkaline Phosphatase: 58 U/L (ref 39–117)
Bilirubin, Direct: 0.1 mg/dL (ref 0.0–0.3)
Total Bilirubin: 0.6 mg/dL (ref 0.2–1.2)
Total Protein: 7.3 g/dL (ref 6.0–8.3)

## 2016-09-11 LAB — LIPID PANEL
Cholesterol: 190 mg/dL (ref 0–200)
HDL: 88.5 mg/dL (ref >39.00)
LDL Cholesterol: 81 mg/dL (ref 0–99)
NonHDL: 101.93
Total CHOL/HDL Ratio: 2
Triglycerides: 106 mg/dL (ref 0.0–149.0)
VLDL: 21.2 mg/dL (ref 0.0–40.0)

## 2016-09-11 LAB — BASIC METABOLIC PANEL
BUN: 18 mg/dL (ref 6–23)
CO2: 31 mEq/L (ref 19–32)
Calcium: 9.7 mg/dL (ref 8.4–10.5)
Chloride: 103 mEq/L (ref 96–112)
Creatinine, Ser: 0.76 mg/dL (ref 0.40–1.20)
GFR: 77.4 mL/min (ref >60.00)
Glucose, Bld: 90 mg/dL (ref 70–99)
Potassium: 4.5 mEq/L (ref 3.5–5.1)
Sodium: 140 mEq/L (ref 135–145)

## 2016-09-11 LAB — CBC WITH DIFFERENTIAL/PLATELET
Basophils Absolute: 0.1 10*3/uL (ref 0.0–0.1)
Basophils Relative: 1.8 % (ref 0.0–3.0)
Eosinophils Absolute: 0.3 10*3/uL (ref 0.0–0.7)
Eosinophils Relative: 3.8 % (ref 0.0–5.0)
HCT: 39.6 % (ref 36.0–46.0)
Hemoglobin: 13.1 g/dL (ref 12.0–15.0)
Lymphocytes Relative: 28.9 % (ref 12.0–46.0)
Lymphs Abs: 2.1 10*3/uL (ref 0.7–4.0)
MCHC: 33.1 g/dL (ref 30.0–36.0)
MCV: 92.4 fl (ref 78.0–100.0)
Monocytes Absolute: 0.7 10*3/uL (ref 0.1–1.0)
Monocytes Relative: 9.6 % (ref 3.0–12.0)
Neutro Abs: 4 10*3/uL (ref 1.4–7.7)
Neutrophils Relative %: 55.9 % (ref 43.0–77.0)
Platelets: 364 10*3/uL (ref 150.0–400.0)
RBC: 4.29 Mil/uL (ref 3.87–5.11)
RDW: 13.7 % (ref 11.5–15.5)
WBC: 7.1 10*3/uL (ref 4.0–10.5)

## 2016-09-11 LAB — TSH: TSH: 3.04 u[IU]/mL (ref 0.35–4.50)

## 2016-09-11 LAB — VITAMIN D 25 HYDROXY (VIT D DEFICIENCY, FRACTURES): VITD: 38.97 ng/mL (ref 30.00–100.00)

## 2016-09-11 NOTE — Progress Notes (Signed)
Subjective:    Patient ID: Jennifer Donaldson, female    DOB: 1932-10-11, 83 y.o.   MRN: [id number]  HPI  Patient presents for yearly preventative medicine examination. She is a pleasant 83 year old female who  has a past medical history of C. difficile colitis; CAD (coronary artery disease); Hyperlipidemia; Mitral regurgitation; Osteopenia; Raynaud's disease; and Special screening for malignant neoplasm of colon.  All immunizations and health maintenance protocols were reviewed with the patient and needed orders were placed. She is UTD on all vaccinations   Appropriate screening laboratory values were ordered for the patient including screening of hyperlipidemia, renal function and hepatic function.  Medication reconciliation,  past medical history, social history, problem list and allergies were reviewed in detail with the patient  Goals were established with regard to weight loss, exercise, and  diet in compliance with medications  End of life planning was discussed. She has an advanced directive and living will.   She is up to date on her mammogram and has been to GYN this year. She will have a bone density screen next year.   She is going to the Dermatologist in a few weeks for a skin exam   She takes Fosamax for osteoporosis   Hyperlipidemia is controlled with lipitor 20 mg  She takes Xanax as needed for anxiety    Review of Systems  Constitutional: Negative.   HENT: Negative.   Eyes: Negative.   Respiratory: Negative.   Cardiovascular: Negative.   Gastrointestinal: Negative.   Endocrine: Negative.   Genitourinary: Negative.   Musculoskeletal: Positive for arthralgias and back pain.  Allergic/Immunologic: Negative.   Neurological: Negative.   Hematological: Negative.   Psychiatric/Behavioral: Negative.   All other systems reviewed and are negative.  Past Medical History:  Diagnosis Date  . C. difficile colitis   . CAD (coronary artery disease)   .  Hyperlipidemia   . Mitral regurgitation   . Osteopenia   . Raynaud's disease   . Special screening for malignant neoplasm of colon     Social History   Social History  . Marital status: Married    Spouse name: N/A  . Number of children: N/A  . Years of education: N/A   Occupational History  . Not on file.   Social History Main Topics  . Smoking status: Former Smoker    Packs/day: 0.50    Years: 20.00    Types: Cigarettes    Quit date: 09/01/1983  . Smokeless tobacco: Never Used  . Alcohol use Not on file  . Drug use: Unknown  . Sexual activity: Not on file   Other Topics Concern  . 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 CORONARY STENT PLACEMENT     2 prior drug eluting stent placed in the LAD and right coronary artery     Family History  Problem Relation Age of Onset  . CAD Brother   . Stroke Father   . CAD Mother     Allergies  Allergen Reactions  . Codeine Sulfate     REACTION: sensitivity to    Current Outpatient Prescriptions on File Prior to Visit  Medication Sig Dispense Refill  . alendronate (FOSAMAX) 70 MG tablet Take 70 mg by mouth  once a week.  11  . ALPRAZolam (XANAX) 0.25 MG tablet TAKE 1 TABLET TWICE A DAY AS NEEDED 60 tablet 0  . amLODipine (NORVASC) 2.5 MG tablet Take 1 tablet (2.5 mg total) by mouth daily. 90 tablet 3  . aspirin 81 MG tablet Take 81 mg by mouth daily.      Marland Kitchen atorvastatin (LIPITOR) 20 MG tablet TAKE 1 TABLET BY MOUTH EVERY DAY AT 6PM 90 tablet 0  . calcium citrate-vitamin D (CITRACAL+D) 315-200 MG-UNIT per tablet Take 2 tablets by mouth daily.     . chlorhexidine (PERIDEX) 0.12 % solution Use as directed 5 mLs in the mouth or throat at bedtime as needed (when patient remembers).     . Cholecalciferol (VITAMIN D3)  1000 UNITS CAPS Take 1 tablet by mouth daily.       No current facility-administered medications on file prior to visit.     BP 118/60 (BP Location: Left Arm, Patient Position: Sitting, Cuff Size: Normal)   Temp 97.7 F (36.5 C) (Oral)   Ht 4\' 11"  (1.499 m)   Wt 104 lb 6.4 oz (47.4 kg)   BMI 21.09 kg/m       Objective:   Physical Exam  Constitutional: She is oriented to person, place, and time. She appears well-developed and well-nourished. No distress.  HENT:  Head: Normocephalic and atraumatic.  Right Ear: External ear normal.  Left Ear: External ear normal.  Nose: Nose normal.  Mouth/Throat: Oropharynx is clear and moist. No oropharyngeal exudate.  Eyes: Conjunctivae and EOM are normal. Pupils are equal, round, and reactive to light. Right eye exhibits no discharge. Left eye exhibits no discharge. No scleral icterus.  Neck: Normal range of motion. Neck supple. No JVD present. No tracheal deviation present. No thyromegaly present.  Cardiovascular: Normal rate, regular rhythm, normal heart sounds and intact distal pulses.  Exam reveals no gallop and no friction rub.   No murmur heard. Pulmonary/Chest: Effort normal and breath sounds normal. No stridor. No respiratory distress. She has no wheezes. She has no rales. She exhibits no tenderness.  Abdominal: Soft. Bowel sounds are normal. She exhibits no distension and no mass. There is no tenderness. There is no rebound and no guarding.  Genitourinary:  Genitourinary Comments: Refused   Musculoskeletal: Normal range of motion. She exhibits no edema, tenderness or deformity.  Lymphadenopathy:    She has no cervical adenopathy.  Neurological: She is alert and oriented to person, place, and time. She has normal reflexes. She displays normal reflexes. No cranial nerve deficit. She exhibits normal muscle tone. Coordination normal.  Skin: Skin is warm and dry. No rash noted. She is not diaphoretic. No erythema. No pallor.  Scattered AK's    Psychiatric: She has a normal mood and affect. Her behavior is normal. Judgment and thought content normal.  Nursing note and vitals reviewed.     Assessment & Plan:  1. Routine general medical examination at a health care facility - Healthy and active 83 year old female  - Continue to exercise and eat healthy  - Basic metabolic panel - CBC with Differential/Platelet - Hepatic function panel - Lipid panel - TSH  2. Dyslipidemia - Basic metabolic panel - CBC with Differential/Platelet - Hepatic function panel - Lipid panel - TSH - Consider increasing Lipitor dose   3. Vitamin D deficiency - Consider increasing Vitamin D - Vitamin D, 25-hydroxy  Dorothyann Peng, NP

## 2016-09-12 ENCOUNTER — Telehealth: Payer: Self-pay | Admitting: Adult Health

## 2016-09-12 NOTE — Telephone Encounter (Signed)
Patient is aware 

## 2016-09-12 NOTE — Telephone Encounter (Signed)
Pt retuning you call concerning lab results.

## 2016-09-18 DIAGNOSIS — L718 Other rosacea: Secondary | ICD-10-CM | POA: Diagnosis not present

## 2016-09-18 DIAGNOSIS — L570 Actinic keratosis: Secondary | ICD-10-CM | POA: Diagnosis not present

## 2016-09-26 ENCOUNTER — Ambulatory Visit
Admission: RE | Admit: 2016-09-26 | Discharge: 2016-09-26 | Disposition: A | Discharge status: Home / Self Care | Payer: PPO | Source: Ambulatory Visit | Attending: Obstetrics and Gynecology | Admitting: Obstetrics and Gynecology

## 2016-09-26 DIAGNOSIS — Z1231 Encounter for screening mammogram for malignant neoplasm of breast: Secondary | ICD-10-CM

## 2016-10-02 ENCOUNTER — Encounter: Payer: Self-pay | Admitting: Internal Medicine

## 2016-10-10 DIAGNOSIS — L570 Actinic keratosis: Secondary | ICD-10-CM | POA: Diagnosis not present

## 2016-10-10 DIAGNOSIS — C44329 Squamous cell carcinoma of skin of other parts of face: Secondary | ICD-10-CM | POA: Diagnosis not present

## 2016-10-18 NOTE — Progress Notes (Signed)
Cardiology Office Note   Date:  10/20/2016   ID:  Jennifer Donaldson, DOB 09/18/1932, MRN [id number]  PCP:  Dorothyann Peng, NP  Cardiologist:   Dorris Carnes, MD   No chief complaint on file. F?U of CAD      History of Present Illness: Jennifer Donaldson is a 83 y.o. female with a history of CAD  She wa previously seen by Lowella Dell  I saw her in April 2016 Pt is s/p PCI/DES to LAD and RCA  Nuclear scan in 2013 was normal   I saw the pt in clinic 1 year ago    HDL 88  LDL 81    [name]  No SOB    Outpatient Medications Prior to Visit  Medication Sig Dispense Refill  . alendronate (FOSAMAX) 70 MG tablet Take 70 mg by mouth once a week.  11  . ALPRAZolam (XANAX) 0.25 MG tablet TAKE 1 TABLET TWICE A DAY AS NEEDED 60 tablet 0  . amLODipine (NORVASC) 2.5 MG tablet Take 1 tablet (2.5 mg total) by mouth daily. 90 tablet 3  . aspirin 81 MG tablet Take 81 mg by mouth daily.      Marland Kitchen atorvastatin (LIPITOR) 20 MG tablet TAKE 1 TABLET BY MOUTH EVERY DAY AT 6PM 90 tablet 0  . calcium citrate-vitamin D (CITRACAL+D) 315-200 MG-UNIT per tablet Take 2 tablets by mouth daily.     . chlorhexidine (PERIDEX) 0.12 % solution Use as directed 5 mLs in the mouth or throat at bedtime as needed (when patient remembers).     . Cholecalciferol (VITAMIN D3) 1000 UNITS CAPS Take 1 tablet by mouth daily.       No facility-administered medications prior to visit.      Allergies:   Codeine sulfate   Past Medical History:  Diagnosis Date  . C. difficile colitis   . CAD (coronary artery disease)   . Hyperlipidemia   . Mitral regurgitation   . Osteopenia   . Raynaud's disease   . Special screening for malignant neoplasm of colon     Past Surgical History:  Procedure Laterality Date  . CORONARY STENT PLACEMENT     2 prior drug eluting stent placed in the LAD and right coronary artery      Social History:  The patient  reports that she quit smoking about 33 years ago. Her smoking use included  Cigarettes. She has a 10.00 pack-year smoking history. She has never used smokeless tobacco. She reports that she does not drink alcohol or use drugs.   Family History:  The patient's family history includes CAD in her brother and mother; Stroke in her father.    ROS:  Please see the history of present illness. All other systems are reviewed and  Negative to the above problem except as noted.    PHYSICAL EXAM: VS:  BP 132/64   Pulse 70   Ht 4\' 11"  (1.499 m)   Wt 47.7 kg (105 lb 3.2 oz)   BMI 21.25 kg/m   GEN: Well nourished, well developed, in no acute distress HEENT: normal Neck: no JVD, carotid bruits, or masses Cardiac: RRR; no murmurs, rubs, or gallops,no edema  Respiratory:  clear to auscultation bilaterally, normal work of breathing GI: soft, nontender, nondistended, + BS  No hepatomegaly  MS: no deformity Moving all extremities   Skin: warm and dry, no rash Neuro:  Strength and sensation are intact Psych: euthymic mood, full affect   EKG:  EKG is ordered  today.  SR 70 bpm     Lipid Panel    Component Value Date/Time   CHOL 190 09/11/2016 0823   TRIG 106.0 09/11/2016 0823   HDL 88.50 09/11/2016 0823   CHOLHDL 2 09/11/2016 0823   VLDL 21.2 09/11/2016 0823   LDLCALC 81 09/11/2016 0823   LDLDIRECT 124.7 07/20/2012 0958      Wt Readings from Last 3 Encounters:  10/20/16 47.7 kg (105 lb 3.2 oz)  09/11/16 47.4 kg (104 lb 6.4 oz)  10/05/15 47.2 kg (104 lb 1.9 oz)      ASSESSMENT AND PLAN:  1  CAD No symptoms to sugg angina  Keep on same regimen   2.  HTN  BP is good   3.  HL Discussed diet  She admits to getting slack on her diet  Goal of LDL 70 or less    F/U in 1 year    Signed, Dorris Carnes, MD  10/20/2016 3:32 PM    [facility] [address], [place], [place]  [address] Phone: [phone]; Fax: [phone]

## 2016-10-20 ENCOUNTER — Encounter: Payer: Self-pay | Admitting: Internal Medicine

## 2016-10-20 ENCOUNTER — Ambulatory Visit (INDEPENDENT_AMBULATORY_CARE_PROVIDER_SITE_OTHER): Payer: PPO | Admitting: Internal Medicine

## 2016-10-20 VITALS — BP 132/64 | HR 70 | Ht 59.0 in | Wt 105.2 lb

## 2016-10-20 DIAGNOSIS — I10 Essential (primary) hypertension: Secondary | ICD-10-CM

## 2016-10-20 DIAGNOSIS — I2583 Coronary atherosclerosis due to lipid rich plaque: Secondary | ICD-10-CM

## 2016-10-20 DIAGNOSIS — E782 Mixed hyperlipidemia: Secondary | ICD-10-CM

## 2016-10-20 DIAGNOSIS — I2510 Atherosclerotic heart disease of native coronary artery without angina pectoris: Secondary | ICD-10-CM

## 2016-10-20 NOTE — Patient Instructions (Signed)
Your physician recommends that you continue on your current medications as directed. Please refer to the Current Medication list given to you today. Your physician wants you to follow-up in: 1 year with Dr. Ross.  You will receive a reminder letter in the mail two months in advance. If you don't receive a letter, please call our office to schedule the follow-up appointment.  

## 2016-11-20 DIAGNOSIS — C44329 Squamous cell carcinoma of skin of other parts of face: Secondary | ICD-10-CM | POA: Diagnosis not present

## 2016-11-30 ENCOUNTER — Other Ambulatory Visit: Payer: Self-pay | Admitting: Internal Medicine

## 2017-01-01 ENCOUNTER — Other Ambulatory Visit: Payer: Self-pay | Admitting: Obstetrics and Gynecology

## 2017-01-01 DIAGNOSIS — Z1231 Encounter for screening mammogram for malignant neoplasm of breast: Secondary | ICD-10-CM

## 2017-01-16 ENCOUNTER — Other Ambulatory Visit: Payer: Self-pay | Admitting: Adult Health

## 2017-01-16 NOTE — Telephone Encounter (Signed)
Ok to refill 

## 2017-01-16 NOTE — Telephone Encounter (Signed)
Last filled on 05/28/16 #60 Had yearly on 09/11/16 No future appointment scheduled Please advise.  Thanks!!

## 2017-01-16 NOTE — Telephone Encounter (Signed)
Called to the pharmacy and left on machine. 

## 2017-02-04 ENCOUNTER — Ambulatory Visit (INDEPENDENT_AMBULATORY_CARE_PROVIDER_SITE_OTHER): Payer: PPO | Admitting: *Deleted

## 2017-02-04 DIAGNOSIS — Z23 Encounter for immunization: Secondary | ICD-10-CM | POA: Diagnosis not present

## 2017-02-06 ENCOUNTER — Ambulatory Visit (INDEPENDENT_AMBULATORY_CARE_PROVIDER_SITE_OTHER): Payer: PPO | Admitting: Adult Health

## 2017-02-06 ENCOUNTER — Encounter: Payer: Self-pay | Admitting: Adult Health

## 2017-02-06 VITALS — BP 144/60 | Temp 97.6°F | Wt 106.0 lb

## 2017-02-06 DIAGNOSIS — R197 Diarrhea, unspecified: Secondary | ICD-10-CM | POA: Diagnosis not present

## 2017-02-06 LAB — BASIC METABOLIC PANEL
BUN: 13 mg/dL (ref 6–23)
CO2: 31 mEq/L (ref 19–32)
Calcium: 9.9 mg/dL (ref 8.4–10.5)
Chloride: 100 mEq/L (ref 96–112)
Creatinine, Ser: 0.76 mg/dL (ref 0.40–1.20)
GFR: 77.32 mL/min (ref >60.00)
Glucose, Bld: 93 mg/dL (ref 70–99)
Potassium: 4.1 mEq/L (ref 3.5–5.1)
Sodium: 137 mEq/L (ref 135–145)

## 2017-02-06 LAB — CBC WITH DIFFERENTIAL/PLATELET
Basophils Absolute: 0.1 10*3/uL (ref 0.0–0.1)
Basophils Relative: 1.1 % (ref 0.0–3.0)
Eosinophils Absolute: 0.3 10*3/uL (ref 0.0–0.7)
Eosinophils Relative: 3.4 % (ref 0.0–5.0)
HCT: 38.5 % (ref 36.0–46.0)
Hemoglobin: 12.7 g/dL (ref 12.0–15.0)
Lymphocytes Relative: 26.4 % (ref 12.0–46.0)
Lymphs Abs: 2 10*3/uL (ref 0.7–4.0)
MCHC: 32.9 g/dL (ref 30.0–36.0)
MCV: 92.5 fl (ref 78.0–100.0)
Monocytes Absolute: 0.6 10*3/uL (ref 0.1–1.0)
Monocytes Relative: 8.1 % (ref 3.0–12.0)
Neutro Abs: 4.5 10*3/uL (ref 1.4–7.7)
Neutrophils Relative %: 61 % (ref 43.0–77.0)
Platelets: 333 10*3/uL (ref 150.0–400.0)
RBC: 4.16 Mil/uL (ref 3.87–5.11)
RDW: 13.2 % (ref 11.5–15.5)
WBC: 7.4 10*3/uL (ref 4.0–10.5)

## 2017-02-06 NOTE — Progress Notes (Signed)
Subjective:    Patient ID: Jennifer Donaldson, female    DOB: 02/18/1933, 83 y.o.   MRN: [id number]  HPI  83 year old female who  has a past medical history of C. difficile colitis; CAD (coronary artery disease); Hyperlipidemia; Mitral regurgitation; Osteopenia; Raynaud's disease; and Special screening for malignant neoplasm of colon. She presents to the office today for abdominal pain and diarrhea x 2 days. She has a history of C.Diff and is worried that she may have it again. Pain is described as cramping.   She reports a different smell and color to stool.   She does not take any antibiotics on a regular basis.   She denies fevers or blood in the stool.    Review of Systems See HPI   Past Medical History:  Diagnosis Date  . C. difficile colitis   . CAD (coronary artery disease)   . Hyperlipidemia   . Mitral regurgitation   . Osteopenia   . Raynaud's disease   . Special screening for malignant neoplasm of colon     Social History   Social History  . Marital status: Married    Spouse name: N/A  . Number of children: N/A  . Years of education: N/A   Occupational History  . Not on file.   Social History Main Topics  . Smoking status: Former Smoker    Packs/day: 0.50    Years: 20.00    Types: Cigarettes    Quit date: 09/01/1983  . Smokeless tobacco: Never Used  . Alcohol use No  . Drug use: No  . Sexual activity: Not on file   Other Topics Concern  . Not on file   Social History Narrative   Retired Engineer, structural   Married for 58 years - husband has multiple medical issues and is disabled   4 children   Grew up in [place]    Past Surgical History:  Procedure Laterality Date  . CORONARY STENT PLACEMENT     2 prior drug eluting stent placed in the LAD and right coronary artery     Family History  Problem Relation Age of Onset  . CAD Brother   . Stroke Father   . CAD Mother   . Breast cancer Neg Hx     Allergies  Allergen  Reactions  . Codeine Sulfate     REACTION: sensitivity to    Current Outpatient Prescriptions on File Prior to Visit  Medication Sig Dispense Refill  . alendronate (FOSAMAX) 70 MG tablet Take 70 mg by mouth once a week.  11  . ALPRAZolam (XANAX) 0.25 MG tablet TAKE 1 TABLET BY MOUTH TWICE A DAY AS NEEDED 60 tablet 0  . amLODipine (NORVASC) 2.5 MG tablet Take 1 tablet (2.5 mg total) by mouth daily. 90 tablet 3  . aspirin 81 MG tablet Take 81 mg by mouth daily.      Marland Kitchen atorvastatin (LIPITOR) 20 MG tablet TAKE 1 TABLET BY MOUTH EVERY DAY AT 6PM 90 tablet 0  . calcium citrate-vitamin D (CITRACAL+D) 315-200 MG-UNIT per tablet Take 2 tablets by mouth daily.     . chlorhexidine (PERIDEX) 0.12 % solution Use as directed 5 mLs in the mouth or throat at bedtime as needed (when patient remembers).     . Cholecalciferol (VITAMIN D3) 1000 UNITS CAPS Take 1 tablet by mouth daily.       No current facility-administered medications on file prior to visit.     BP (!) 144/60 (  BP Location: Left Arm)   Temp 97.6 F (36.4 C) (Oral)   Wt 106 lb (48.1 kg)   BMI 21.41 kg/m       Objective:   Physical Exam  Constitutional: She appears well-developed and well-nourished. No distress.  Cardiovascular: Normal rate, regular rhythm, normal heart sounds and intact distal pulses.  Exam reveals no gallop and no friction rub.   No murmur heard. Pulmonary/Chest: Effort normal and breath sounds normal. No respiratory distress. She has no wheezes. She has no rales. She exhibits no tenderness.  Abdominal: Soft. Bowel sounds are normal. She exhibits no distension and no mass. There is tenderness (diffuse ). There is no rebound and no guarding.  Neurological: She is alert.  Skin: Skin is warm and dry. No rash noted. She is not diaphoretic. No erythema. No pallor.  Psychiatric: She has a normal mood and affect. Her behavior is normal. Judgment and thought content normal.  Nursing note and vitals reviewed.       Assessment & Plan:  1. Diarrhea, unspecified type - Likely viral GI bug, do to possibility of reoccurrence I will check for C diff. Advised to rest and stay hydrated. Stay away from imodium for the time being.   - C. difficile, PCR - CBC with Differential/Platelet - Basic Metabolic Panel   Dorothyann Peng, NP

## 2017-02-07 LAB — CLOSTRIDIUM DIFFICILE BY PCR: Toxigenic C. Difficile by PCR: NOT DETECTED

## 2017-02-13 ENCOUNTER — Telehealth: Payer: Self-pay | Admitting: Family Medicine

## 2017-02-13 NOTE — Telephone Encounter (Signed)
Copied from [place] #[id number]. Topic: Quick Communication - Lab Results >> Feb 13, 2017  8:12 AM Lennox Solders wrote: Pt is returning Erique Kaser call concerning blood work results >> Feb 13, 2017  8:13 AM Lennox Solders wrote: Pt is returning Mitsuo Budnick call concerning blood work results   Pt notified of blood work results by Westley Hummer, CMA See result note in chart.

## 2017-03-25 ENCOUNTER — Other Ambulatory Visit: Payer: Self-pay | Admitting: Internal Medicine

## 2017-04-20 ENCOUNTER — Other Ambulatory Visit: Payer: Self-pay | Admitting: Adult Health

## 2017-04-21 NOTE — Telephone Encounter (Signed)
Sent to the pharmacy by e-scribe. 

## 2017-04-27 DIAGNOSIS — H524 Presbyopia: Secondary | ICD-10-CM | POA: Diagnosis not present

## 2017-04-27 DIAGNOSIS — H5203 Hypermetropia, bilateral: Secondary | ICD-10-CM | POA: Diagnosis not present

## 2017-05-07 DIAGNOSIS — M7551 Bursitis of right shoulder: Secondary | ICD-10-CM | POA: Diagnosis not present

## 2017-05-28 ENCOUNTER — Other Ambulatory Visit: Payer: Self-pay | Admitting: Adult Health

## 2017-05-28 NOTE — Telephone Encounter (Signed)
DENIED.  PER PHARMACY, FILLED ON 04-21-17 FOR #90.  REFILL REQUEST IS TOO SOON.

## 2017-06-22 DIAGNOSIS — Z85828 Personal history of other malignant neoplasm of skin: Secondary | ICD-10-CM | POA: Diagnosis not present

## 2017-06-22 DIAGNOSIS — L700 Acne vulgaris: Secondary | ICD-10-CM | POA: Diagnosis not present

## 2017-06-22 DIAGNOSIS — L570 Actinic keratosis: Secondary | ICD-10-CM | POA: Diagnosis not present

## 2017-07-08 DIAGNOSIS — M810 Age-related osteoporosis without current pathological fracture: Secondary | ICD-10-CM | POA: Diagnosis not present

## 2017-07-17 ENCOUNTER — Other Ambulatory Visit: Payer: Self-pay | Admitting: Adult Health

## 2017-07-17 ENCOUNTER — Ambulatory Visit (INDEPENDENT_AMBULATORY_CARE_PROVIDER_SITE_OTHER): Payer: PPO

## 2017-07-17 VITALS — BP 130/80 | Ht 58.5 in | Wt 104.4 lb

## 2017-07-17 DIAGNOSIS — Z0000 Encounter for general adult medical examination without abnormal findings: Secondary | ICD-10-CM

## 2017-07-17 NOTE — Telephone Encounter (Signed)
Sent to the pharmacy by e-scribe for 90 days.  Pt has cpx scheduled 09/15/17

## 2017-07-17 NOTE — Patient Instructions (Addendum)
Ms. Jennifer Donaldson , Thank you for taking time to come for your Medicare Wellness Visit. I appreciate your ongoing commitment to your health goals. Please review the following plan we discussed and let me know if I can assist you in the future.    [facility]; [phone] [facility]; [phone] Get resource to get information on any and all community programs for Seniors  Community solutions; "Aging Gracefully In Place" program; can request or apply  [place]: 817-708-0776 Community Health Response Program -024-097-3532 Public Health Dept; Need to be a skilled visit but can assist with bathing as well; [phone]  Adult center for Enrichment;  Call Senior Line; [phone]  Adult day services include Adult Day Care, Adult Day Healthcare, Group Respite, Care Partners, Volunteer In Home Respite, Education and Support Program  Caregiver support group and information regarding [place] is at the; [employer] Address: [address], [place], [place] [address]  Phone: [phone]   Shingrix is a vaccine for the prevention of Shingles in Adults 50 and older.  If you are on Medicare, you can request a prescription from your doctor to be filled at a pharmacy.  Please check with your benefits regarding applicable copays or out of pocket expenses.  The Shingrix is given in 2 vaccines approx 8 weeks apart. You must receive the 2nd dose prior to 6 months from receipt of the first.    These are the goals we discussed: Goals    . Exercise 150 min/wk Moderate Activity     Go to the [facility] center more often if you can get some relief  Get help with organized        This is a list of the screening recommended for you and due dates:  Health Maintenance  Topic Date Due  . Flu Shot  11/12/2017  . Tetanus Vaccine  09/21/2018  . DEXA scan (bone density measurement)  Completed  . Pneumonia vaccines  Completed      Fall Prevention in the  Home Falls can cause injuries. They can happen to people of all ages. There are many things you can do to make your home safe and to help prevent falls. What can I do on the outside of my home?  Regularly fix the edges of walkways and driveways and fix any cracks.  Remove anything that might make you trip as you walk through a door, such as a raised step or threshold.  Trim any bushes or trees on the path to your home.  Use bright outdoor lighting.  Clear any walking paths of anything that might make someone trip, such as rocks or tools.  Regularly check to see if handrails are loose or broken. Make sure that both sides of any steps have handrails.  Any raised decks and porches should have guardrails on the edges.  Have any leaves, snow, or ice cleared regularly.  Use sand or salt on walking paths during winter.  Clean up any spills in your garage right away. This includes oil or grease spills. What can I do in the bathroom?  Use night lights.  Install grab bars by the toilet and in the tub and shower. Do not use towel bars as grab bars.  Use non-skid mats or decals in the tub or shower.  If you need to sit down in the shower, use a plastic, non-slip stool.  Keep the floor dry. Clean up any water that spills on the floor as soon as it happens.  Remove soap buildup in the tub or shower regularly.  Attach bath mats securely with double-sided non-slip rug tape.  Do not have throw rugs and other things on the floor that can make you trip. What can I do in the bedroom?  Use night lights.  Make sure that you have a light by your bed that is easy to reach.  Do not use any sheets or blankets that are too big for your bed. They should not hang down onto the floor.  Have a firm chair that has side arms. You can use this for support while you get dressed.  Do not have throw rugs and other things on the floor that can make you trip. What can I do in the kitchen?  Clean up any  spills right away.  Avoid walking on wet floors.  Keep items that you use a lot in easy-to-reach places.  If you need to reach something above you, use a strong step stool that has a grab bar.  Keep electrical cords out of the way.  Do not use floor polish or wax that makes floors slippery. If you must use wax, use non-skid floor wax.  Do not have throw rugs and other things on the floor that can make you trip. What can I do with my stairs?  Do not leave any items on the stairs.  Make sure that there are handrails on both sides of the stairs and use them. Fix handrails that are broken or loose. Make sure that handrails are as long as the stairways.  Check any carpeting to make sure that it is firmly attached to the stairs. Fix any carpet that is loose or worn.  Avoid having throw rugs at the top or bottom of the stairs. If you do have throw rugs, attach them to the floor with carpet tape.  Make sure that you have a light switch at the top of the stairs and the bottom of the stairs. If you do not have them, ask someone to add them for you. What else can I do to help prevent falls?  Wear shoes that: ? Do not have high heels. ? Have rubber bottoms. ? Are comfortable and fit you well. ? Are closed at the toe. Do not wear sandals.  If you use a stepladder: ? Make sure that it is fully opened. Do not climb a closed stepladder. ? Make sure that both sides of the stepladder are locked into place. ? Ask someone to hold it for you, if possible.  Clearly mark and make sure that you can see: ? Any grab bars or handrails. ? First and last steps. ? Where the edge of each step is.  Use tools that help you move around (mobility aids) if they are needed. These include: ? Canes. ? Walkers. ? Scooters. ? Crutches.  Turn on the lights when you go into a dark area. Replace any light bulbs as soon as they burn out.  Set up your furniture so you have a clear path. Avoid moving your  furniture around.  If any of your floors are uneven, fix them.  If there are any pets around you, be aware of where they are.  Review your medicines with your doctor. Some medicines can make you feel dizzy. This can increase your chance of falling. Ask your doctor what other things that you can do to help prevent falls. This information is not intended to replace advice given to you by your health  care provider. Make sure you discuss any questions you have with your health care provider. Document Released: 01/25/2009 Document Revised: 09/06/2015 Document Reviewed: 05/05/2014 Elsevier Interactive Patient Education  2018 [place] Maintenance, Female Adopting a healthy lifestyle and getting preventive care can go a long way to promote health and wellness. Talk with your health care provider about what schedule of regular examinations is right for you. This is a good chance for you to check in with your provider about disease prevention and staying healthy. In between checkups, there are plenty of things you can do on your own. Experts have done a lot of research about which lifestyle changes and preventive measures are most likely to keep you healthy. Ask your health care provider for more information. Weight and diet Eat a healthy diet  Be sure to include plenty of vegetables, fruits, low-fat dairy products, and lean protein.  Do not eat a lot of foods high in solid fats, added sugars, or salt.  Get regular exercise. This is one of the most important things you can do for your health. ? Most adults should exercise for at least 150 minutes each week. The exercise should increase your heart rate and make you sweat (moderate-intensity exercise). ? Most adults should also do strengthening exercises at least twice a week. This is in addition to the moderate-intensity exercise.  Maintain a healthy weight  Body mass index (BMI) is a measurement that can be used to identify possible  weight problems. It estimates body fat based on height and weight. Your health care provider can help determine your BMI and help you achieve or maintain a healthy weight.  For females 26 years of age and older: ? A BMI below 18.5 is considered underweight. ? A BMI of 18.5 to 24.9 is normal. ? A BMI of 25 to 29.9 is considered overweight. ? A BMI of 30 and above is considered obese.  Watch levels of cholesterol and blood lipids  You should start having your blood tested for lipids and cholesterol at 82 years of age, then have this test every 5 years.  You may need to have your cholesterol levels checked more often if: ? Your lipid or cholesterol levels are high. ? You are older than 82 years of age. ? You are at high risk for heart disease.  Cancer screening Lung Cancer  Lung cancer screening is recommended for adults 56-77 years old who are at high risk for lung cancer because of a history of smoking.  A yearly low-dose CT scan of the lungs is recommended for people who: ? Currently smoke. ? Have quit within the past 15 years. ? Have at least a 30-pack-year history of smoking. A pack year is smoking an average of one pack of cigarettes a day for 1 year.  Yearly screening should continue until it has been 15 years since you quit.  Yearly screening should stop if you develop a health problem that would prevent you from having lung cancer treatment.  Breast Cancer  Practice breast self-awareness. This means understanding how your breasts normally appear and feel.  It also means doing regular breast self-exams. Let your health care provider know about any changes, no matter how small.  If you are in your 20s or 30s, you should have a clinical breast exam (CBE) by a health care provider every 1-3 years as part of a regular health exam.  If you are 38 or older, have a CBE every year. Also consider  having a breast X-ray (mammogram) every year.  If you have a family history of  breast cancer, talk to your health care provider about genetic screening.  If you are at high risk for breast cancer, talk to your health care provider about having an MRI and a mammogram every year.  Breast cancer gene (BRCA) assessment is recommended for women who have family members with BRCA-related cancers. BRCA-related cancers include: ? Breast. ? Ovarian. ? Tubal. ? Peritoneal cancers.  Results of the assessment will determine the need for genetic counseling and BRCA1 and BRCA2 testing.  Cervical Cancer Your health care provider may recommend that you be screened regularly for cancer of the pelvic organs (ovaries, uterus, and vagina). This screening involves a pelvic examination, including checking for microscopic changes to the surface of your cervix (Pap test). You may be encouraged to have this screening done every 3 years, beginning at age 62.  For women ages 4-65, health care providers may recommend pelvic exams and Pap testing every 3 years, or they may recommend the Pap and pelvic exam, combined with testing for human papilloma virus (HPV), every 5 years. Some types of HPV increase your risk of cervical cancer. Testing for HPV may also be done on women of any age with unclear Pap test results.  Other health care providers may not recommend any screening for nonpregnant women who are considered low risk for pelvic cancer and who do not have symptoms. Ask your health care provider if a screening pelvic exam is right for you.  If you have had past treatment for cervical cancer or a condition that could lead to cancer, you need Pap tests and screening for cancer for at least 20 years after your treatment. If Pap tests have been discontinued, your risk factors (such as having a new sexual partner) need to be reassessed to determine if screening should resume. Some women have medical problems that increase the chance of getting cervical cancer. In these cases, your health care provider  may recommend more frequent screening and Pap tests.  Colorectal Cancer  This type of cancer can be detected and often prevented.  Routine colorectal cancer screening usually begins at 82 years of age and continues through 82 years of age.  Your health care provider may recommend screening at an earlier age if you have risk factors for colon cancer.  Your health care provider may also recommend using home test kits to check for hidden blood in the stool.  A small camera at the end of a tube can be used to examine your colon directly (sigmoidoscopy or colonoscopy). This is done to check for the earliest forms of colorectal cancer.  Routine screening usually begins at age 61.  Direct examination of the colon should be repeated every 5-10 years through 82 years of age. However, you may need to be screened more often if early forms of precancerous polyps or small growths are found.  Skin Cancer  Check your skin from head to toe regularly.  Tell your health care provider about any new moles or changes in moles, especially if there is a change in a mole's shape or color.  Also tell your health care provider if you have a mole that is larger than the size of a pencil eraser.  Always use sunscreen. Apply sunscreen liberally and repeatedly throughout the day.  Protect yourself by wearing long sleeves, pants, a wide-brimmed hat, and sunglasses whenever you are outside.  Heart disease, diabetes, and high blood pressure  High blood pressure causes heart disease and increases the risk of stroke. High blood pressure is more likely to develop in: ? People who have blood pressure in the high end of the normal range (130-139/85-89 mm Hg). ? People who are overweight or obese. ? People who are African American.  If you are 46-76 years of age, have your blood pressure checked every 3-5 years. If you are 35 years of age or older, have your blood pressure checked every year. You should have your  blood pressure measured twice--once when you are at a hospital or clinic, and once when you are not at a hospital or clinic. Record the average of the two measurements. To check your blood pressure when you are not at a hospital or clinic, you can use: ? An automated blood pressure machine at a pharmacy. ? A home blood pressure monitor.  If you are between 67 years and 53 years old, ask your health care provider if you should take aspirin to prevent strokes.  Have regular diabetes screenings. This involves taking a blood sample to check your fasting blood sugar level. ? If you are at a normal weight and have a low risk for diabetes, have this test once every three years after 82 years of age. ? If you are overweight and have a high risk for diabetes, consider being tested at a younger age or more often. Preventing infection Hepatitis B  If you have a higher risk for hepatitis B, you should be screened for this virus. You are considered at high risk for hepatitis B if: ? You were born in a country where hepatitis B is common. Ask your health care provider which countries are considered high risk. ? Your parents were born in a high-risk country, and you have not been immunized against hepatitis B (hepatitis B vaccine). ? You have HIV or AIDS. ? You use needles to inject street drugs. ? You live with someone who has hepatitis B. ? You have had sex with someone who has hepatitis B. ? You get hemodialysis treatment. ? You take certain medicines for conditions, including cancer, organ transplantation, and autoimmune conditions.  Hepatitis C  Blood testing is recommended for: ? Everyone born from [birth year] through 1963. ? Anyone with known risk factors for hepatitis C.  Sexually transmitted infections (STIs)  You should be screened for sexually transmitted infections (STIs) including gonorrhea and chlamydia if: ? You are sexually active and are younger than 82 years of age. ? You are older than  82 years of age and your health care provider tells you that you are at risk for this type of infection. ? Your sexual activity has changed since you were last screened and you are at an increased risk for chlamydia or gonorrhea. Ask your health care provider if you are at risk.  If you do not have HIV, but are at risk, it may be recommended that you take a prescription medicine daily to prevent HIV infection. This is called pre-exposure prophylaxis (PrEP). You are considered at risk if: ? You are sexually active and do not regularly use condoms or know the HIV status of your partner(s). ? You take drugs by injection. ? You are sexually active with a partner who has HIV.  Talk with your health care provider about whether you are at high risk of being infected with HIV. If you choose to begin PrEP, you should first be tested for HIV. You should then be tested every 3 months  for as long as you are taking PrEP. Pregnancy  If you are premenopausal and you may become pregnant, ask your health care provider about preconception counseling.  If you may become pregnant, take 400 to 800 micrograms (mcg) of folic acid every day.  If you want to prevent pregnancy, talk to your health care provider about birth control (contraception). Osteoporosis and menopause  Osteoporosis is a disease in which the bones lose minerals and strength with aging. This can result in serious bone fractures. Your risk for osteoporosis can be identified using a bone density scan.  If you are 39 years of age or older, or if you are at risk for osteoporosis and fractures, ask your health care provider if you should be screened.  Ask your health care provider whether you should take a calcium or vitamin D supplement to lower your risk for osteoporosis.  Menopause may have certain physical symptoms and risks.  Hormone replacement therapy may reduce some of these symptoms and risks. Talk to your health care provider about whether  hormone replacement therapy is right for you. Follow these instructions at home:  Schedule regular health, dental, and eye exams.  Stay current with your immunizations.  Do not use any tobacco products including cigarettes, chewing tobacco, or electronic cigarettes.  If you are pregnant, do not drink alcohol.  If you are breastfeeding, limit how much and how often you drink alcohol.  Limit alcohol intake to no more than 1 drink per day for nonpregnant women. One drink equals 12 ounces of beer, 5 ounces of wine, or 1 ounces of hard liquor.  Do not use street drugs.  Do not share needles.  Ask your health care provider for help if you need support or information about quitting drugs.  Tell your health care provider if you often feel depressed.  Tell your health care provider if you have ever been abused or do not feel safe at home. This information is not intended to replace advice given to you by your health care provider. Make sure you discuss any questions you have with your health care provider. Document Released: 10/14/2010 Document Revised: 09/06/2015 Document Reviewed: 01/02/2015 Elsevier Interactive Patient Education  [employer].

## 2017-07-17 NOTE — Progress Notes (Signed)
Subjective:   Jennifer Donaldson is a 84 y.o. female who presents for Medicare Annual (Subsequent) preventive examination.  Reports health as challenged  Spouse  4 children and 9 grand  On great and expecting 3 more Leaves him for awhile ([facility] )  Admits she worries "goes all day" does not stop   Had 2 stents in 2005 Had stress test  Had lost a good friend yesterday from a brain tumor    Diet Chol/hdl 2 hdl 88;  BMI 21.4 Weight 104;  (her weight 106 and 110 )  Her scales are 100  Had cdiff x 84 yo; lost to 95 lbs   Exercise Fitness room at [facility]  Former smoker; quit 87 quit 84 yo with 10 pack hx  There are no preventive care reminders to display for this patient.  Aged out colonoscopy; last one 10/2008 Mammogram 09/2016 -  Patient reported Dexa "osteoporosis" and is on fosamax   osteopenia is on her problem list  GYN [facility] Cherre Huger   Zoster in 2009 Educated regarding the shingrix     Objective:     IN process of completing with dtr   Vitals: BP 130/80   Ht 4' 10.5" (1.486 m)   Wt 104 lb 7 oz (47.4 kg)   BMI 21.46 kg/m   Body mass index is 21.46 kg/m.  Advanced Directives 07/17/2017  Does Patient Have a Medical Advance Directive? No    Tobacco Social History   Tobacco Use  Smoking Status Former Smoker  . Packs/day: 0.50  . Years: 20.00  . Pack years: 10.00  . Types: Cigarettes  . Last attempt to quit: 09/01/1983  . Years since quitting: 33.8  Smokeless Tobacco Never Used     Counseling given: Yes   Clinical Intake:     Past Medical History:  Diagnosis Date  . C. difficile colitis   . CAD (coronary artery disease)   . Hyperlipidemia   . Mitral regurgitation   . Osteopenia   . Raynaud's disease   . Special screening for malignant neoplasm of colon    Past Surgical History:  Procedure Laterality Date  . CORONARY STENT PLACEMENT     2 prior drug eluting stent placed in the LAD and right coronary artery      Family History  Problem Relation Age of Onset  . CAD Brother   . Stroke Father   . CAD Mother   . Breast cancer Neg Hx    Social History   Socioeconomic History  . Marital status: Married    Spouse name: Not on file  . Number of children: Not on file  . Years of education: Not on file  . Highest education level: Not on file  Occupational History  . Not on file  Social Needs  . Financial resource strain: Not on file  . Food insecurity:    Worry: Not on file    Inability: Not on file  . Transportation needs:    Medical: Not on file    Non-medical: Not on file  Tobacco Use  . Smoking status: Former Smoker    Packs/day: 0.50    Years: 20.00    Pack years: 10.00    Types: Cigarettes    Last attempt to quit: 09/01/1983    Years since quitting: 33.8  . Smokeless tobacco: Never Used  Substance and Sexual Activity  . Alcohol use: No    Alcohol/week: 0.0 oz  .  Drug use: No  . Sexual activity: Not on file  Lifestyle  . Physical activity:    Days per week: Not on file    Minutes per session: Not on file  . Stress: Not on file  Relationships  . Social connections:    Talks on phone: Not on file    Gets together: Not on file    Attends religious service: Not on file    Active member of club or organization: Not on file    Attends meetings of clubs or organizations: Not on file    Relationship status: Not on file  Other Topics Concern  . 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 alendronate (FOSAMAX) 70 MG tablet Take 70 mg by mouth once a week.  Marland Kitchen amLODipine (NORVASC) 2.5 MG tablet TAKE 1 TABLET (2.5 MG TOTAL) BY MOUTH DAILY.  Marland Kitchen aspirin 81 MG tablet Take 81 mg by mouth daily.    Marland Kitchen atorvastatin (LIPITOR) 20 MG tablet TAKE 1 TABLET BY MOUTH EVERY DAY  AT 6PM  . calcium citrate-vitamin D (CITRACAL+D) 315-200 MG-UNIT per tablet Take 2 tablets by mouth daily.   . chlorhexidine (PERIDEX) 0.12 % solution Use as directed 5 mLs in the mouth or throat at bedtime as needed (when patient remembers).   . Cholecalciferol (VITAMIN D3) 1000 UNITS CAPS Take 1 tablet by mouth daily.    Marland Kitchen doxycycline (VIBRAMYCIN) 50 MG capsule Take 50 mg by mouth daily.  Marland Kitchen ALPRAZolam (XANAX) 0.25 MG tablet TAKE 1 TABLET BY MOUTH TWICE A DAY AS NEEDED (Patient not taking: Reported on 07/17/2017)  . [DISCONTINUED] amLODipine (NORVASC) 2.5 MG tablet TAKE 1 TABLET (2.5 MG TOTAL) BY MOUTH DAILY.   No facility-administered encounter medications on file as of 07/17/2017.     Activities of Daily Living In your present state of health, do you have any difficulty performing the following activities: 07/17/2017  Hearing? N  Vision? N  Difficulty concentrating or making decisions? N  Walking or climbing stairs? N  Dressing or bathing? N  Doing errands, shopping? N  Preparing Food and eating ? N  Using the Toilet? N  In the past six months, have you accidently leaked urine? N  Comment frequency  Do you have problems with loss of bowel control? N  Managing your Medications? N  Managing your Finances? N  Housekeeping or managing your Housekeeping? N  Some recent data might be hidden    Patient Care Team: Dorothyann Peng, NP as PCP - General (Family Medicine)    Assessment:   This is a routine wellness examination for [place].  Exercise Activities and Dietary recommendations Current Exercise Habits: Home exercise routine, Type of exercise: walking, Intensity: Moderate  Goals    . Exercise 150 min/wk Moderate Activity     Go to the [facility] center more often if you can get some relief  Get help with organized        Fall Risk Fall Risk  07/17/2017 08/02/2013  Falls in the past year? No No     Depression Screen PHQ 2/9 Scores 07/17/2017 08/02/2013  PHQ - 2 Score 1 0      Cognitive Function MMSE - Mini Mental State Exam 07/17/2017  Not completed: (No Data)  Immunization History  Administered Date(s) Administered  . Influenza Split 01/22/2011, 02/11/2012  . Influenza Whole 01/26/2008, 02/06/2009, 01/04/2010  . Influenza, High Dose Seasonal PF 12/29/2014, 02/06/2016, 02/04/2017  . Influenza,inj,Quad PF,6+ Mos 12/14/2012, 01/17/2014  . Pneumococcal Conjugate-13 01/17/2014  . Pneumococcal Polysaccharide-23 12/06/1999  . Td 04/14/1998, 09/20/2008  . Zoster 04/15/2007      Screening Tests Health Maintenance  Topic Date Due  . INFLUENZA VACCINE  11/12/2017  . TETANUS/TDAP  09/21/2018  . DEXA SCAN  Completed  . PNA vac Low Risk Adult  Completed        Plan:      PCP Notes   Health Maintenance Spent time discussing her care giving role and de-stressing Educated regarding shingrix Dexa is followed by GYN at [facility]   Encouraging time for herself  (to note- [facility] the [facility])   Abnormal Screens  none  Referrals  none  Patient concerns; Doesn't sleep well. Is not taking Xanax as it makes her feel "funny". Discussed other ways to lessen stress   Nurse Concerns; As noted   Next PCP apt 09/15/2017   I have personally reviewed and noted the following in the patient's chart:   . Medical and social history . Use of alcohol, tobacco or illicit drugs  . Current medications and supplements . Functional ability and status . Nutritional status . Physical activity . Advanced directives . List of other physicians . Hospitalizations, surgeries, and ER visits in previous 12 months . Vitals . Screenings to include cognitive, depression, and falls . Referrals and appointments  In addition, I have reviewed and discussed with patient certain preventive protocols, quality metrics, and best practice recommendations. A written personalized care plan for preventive services as well as general preventive  health recommendations were provided to patient.     Wynetta Fines, RN  07/17/2017

## 2017-07-17 NOTE — Progress Notes (Signed)
I have reviewed and agree with this plan  

## 2017-08-04 ENCOUNTER — Ambulatory Visit (INDEPENDENT_AMBULATORY_CARE_PROVIDER_SITE_OTHER): Payer: PPO | Admitting: Family Medicine

## 2017-08-04 ENCOUNTER — Encounter: Payer: Self-pay | Admitting: Family Medicine

## 2017-08-04 VITALS — BP 140/78 | HR 79 | Temp 98.1°F | Ht 58.5 in | Wt 104.4 lb

## 2017-08-04 DIAGNOSIS — M542 Cervicalgia: Secondary | ICD-10-CM | POA: Diagnosis not present

## 2017-08-04 MED ORDER — CYCLOBENZAPRINE HCL 10 MG PO TABS: 10.0000 mg | ORAL_TABLET | Freq: Three times a day (TID) | ORAL | 0 refills | Status: DC | PRN

## 2017-08-04 MED ORDER — MELOXICAM 15 MG PO TABS: 15.0000 mg | ORAL_TABLET | Freq: Every day | ORAL | 0 refills | Status: DC

## 2017-08-04 NOTE — Progress Notes (Signed)
   Subjective:    Patient ID: Jennifer Donaldson, female    DOB: 07/22/1932, 84 y.o.   MRN: [id number]  HPI Here for several days of a sharp in the right posterior neck that runs up the right side of the back of the head. No recent trauma. Heat and Meloxicam have helped.    Review of Systems  Constitutional: Negative.   Respiratory: Negative.   Cardiovascular: Negative.   Musculoskeletal: Positive for neck pain and neck stiffness.  Neurological: Negative for headaches.       Objective:   Physical Exam  Constitutional: She is oriented to person, place, and time. She appears well-developed and well-nourished.  Cardiovascular: Normal rate, regular rhythm, normal heart sounds and intact distal pulses.  Pulmonary/Chest: Effort normal and breath sounds normal. No respiratory distress. She has no wheezes. She has no rales.  Musculoskeletal:  Tender in the right posterior neck just at the bottom of the skull. ROM is limited by pain.   Neurological: She is alert and oriented to person, place, and time.          Assessment & Plan:  Neck pain from a pinched cervical nerve. Stay on heat and Meloxicam. Add Flexeril prn.  Alysia Penna, MD

## 2017-08-07 ENCOUNTER — Telehealth: Payer: Self-pay | Admitting: *Deleted

## 2017-08-07 NOTE — Telephone Encounter (Signed)
Prior auth for Cyclobenzaprine 10mg  sent to [id number].

## 2017-09-04 DIAGNOSIS — M810 Age-related osteoporosis without current pathological fracture: Secondary | ICD-10-CM | POA: Diagnosis not present

## 2017-09-07 ENCOUNTER — Other Ambulatory Visit: Payer: Self-pay | Admitting: Family Medicine

## 2017-09-08 NOTE — Telephone Encounter (Signed)
Last OV  08/04/2017   Last refilled 08/04/2017 disp 60 with no refills   Sent to PCP for approval   Pt should have medication for May, June, and July but no refills after that

## 2017-09-12 ENCOUNTER — Other Ambulatory Visit: Payer: Self-pay | Admitting: Internal Medicine

## 2017-09-15 ENCOUNTER — Encounter: Payer: PPO | Admitting: Adult Health

## 2017-09-28 ENCOUNTER — Ambulatory Visit
Admission: RE | Admit: 2017-09-28 | Discharge: 2017-09-28 | Disposition: A | Discharge status: Home / Self Care | Payer: PPO | Source: Ambulatory Visit | Attending: Obstetrics and Gynecology | Admitting: Obstetrics and Gynecology

## 2017-09-28 DIAGNOSIS — Z1231 Encounter for screening mammogram for malignant neoplasm of breast: Secondary | ICD-10-CM

## 2017-09-29 NOTE — Telephone Encounter (Signed)
PA approved for cyclobenzaprine 10 mg tablets 08/07/17-04/13/18.

## 2017-10-17 ENCOUNTER — Other Ambulatory Visit: Payer: Self-pay | Admitting: Adult Health

## 2017-10-20 NOTE — Telephone Encounter (Signed)
Sent to the pharmacy by e-scribe for 90 days.  Pt is scheduled to see Tommi Rumps on 11/05/17.

## 2017-11-05 ENCOUNTER — Ambulatory Visit (INDEPENDENT_AMBULATORY_CARE_PROVIDER_SITE_OTHER): Payer: PPO | Admitting: Adult Health

## 2017-11-05 VITALS — BP 142/90 | Temp 97.6°F | Ht <= 58 in | Wt 101.0 lb

## 2017-11-05 DIAGNOSIS — IMO0002 Reserved for concepts with insufficient information to code with codable children: Secondary | ICD-10-CM

## 2017-11-05 DIAGNOSIS — I2583 Coronary atherosclerosis due to lipid rich plaque: Secondary | ICD-10-CM

## 2017-11-05 DIAGNOSIS — I2510 Atherosclerotic heart disease of native coronary artery without angina pectoris: Secondary | ICD-10-CM

## 2017-11-05 DIAGNOSIS — M810 Age-related osteoporosis without current pathological fracture: Secondary | ICD-10-CM

## 2017-11-05 DIAGNOSIS — Z0000 Encounter for general adult medical examination without abnormal findings: Secondary | ICD-10-CM

## 2017-11-05 DIAGNOSIS — M6530 Trigger finger, unspecified finger: Secondary | ICD-10-CM | POA: Diagnosis not present

## 2017-11-05 DIAGNOSIS — E785 Hyperlipidemia, unspecified: Secondary | ICD-10-CM

## 2017-11-05 LAB — CBC WITH DIFFERENTIAL/PLATELET
Basophils Absolute: 0.1 10*3/uL (ref 0.0–0.1)
Basophils Relative: 1.1 % (ref 0.0–3.0)
Eosinophils Absolute: 0.2 10*3/uL (ref 0.0–0.7)
Eosinophils Relative: 1.3 % (ref 0.0–5.0)
HCT: 38.2 % (ref 36.0–46.0)
Hemoglobin: 12.5 g/dL (ref 12.0–15.0)
Lymphocytes Relative: 17.7 % (ref 12.0–46.0)
Lymphs Abs: 2.1 10*3/uL (ref 0.7–4.0)
MCHC: 32.7 g/dL (ref 30.0–36.0)
MCV: 92.9 fl (ref 78.0–100.0)
Monocytes Absolute: 1 10*3/uL (ref 0.1–1.0)
Monocytes Relative: 8.5 % (ref 3.0–12.0)
Neutro Abs: 8.3 10*3/uL — ABNORMAL HIGH (ref 1.4–7.7)
Neutrophils Relative %: 71.4 % (ref 43.0–77.0)
Platelets: 384 10*3/uL (ref 150.0–400.0)
RBC: 4.11 Mil/uL (ref 3.87–5.11)
RDW: 14.1 % (ref 11.5–15.5)
WBC: 11.6 10*3/uL — ABNORMAL HIGH (ref 4.0–10.5)

## 2017-11-05 LAB — LIPID PANEL
Cholesterol: 182 mg/dL (ref 0–200)
HDL: 89.2 mg/dL (ref >39.00)
LDL Cholesterol: 81 mg/dL (ref 0–99)
NonHDL: 92.75
Total CHOL/HDL Ratio: 2
Triglycerides: 61 mg/dL (ref 0.0–149.0)
VLDL: 12.2 mg/dL (ref 0.0–40.0)

## 2017-11-05 LAB — BASIC METABOLIC PANEL
BUN: 17 mg/dL (ref 6–23)
CO2: 30 mEq/L (ref 19–32)
Calcium: 9.8 mg/dL (ref 8.4–10.5)
Chloride: 101 mEq/L (ref 96–112)
Creatinine, Ser: 0.81 mg/dL (ref 0.40–1.20)
GFR: 71.71 mL/min (ref >60.00)
Glucose, Bld: 94 mg/dL (ref 70–99)
Potassium: 4.4 mEq/L (ref 3.5–5.1)
Sodium: 139 mEq/L (ref 135–145)

## 2017-11-05 LAB — HEPATIC FUNCTION PANEL
ALT: 15 U/L (ref 0–35)
AST: 25 U/L (ref 0–37)
Albumin: 4.4 g/dL (ref 3.5–5.2)
Alkaline Phosphatase: 59 U/L (ref 39–117)
Bilirubin, Direct: 0.1 mg/dL (ref 0.0–0.3)
Total Bilirubin: 0.8 mg/dL (ref 0.2–1.2)
Total Protein: 7.5 g/dL (ref 6.0–8.3)

## 2017-11-05 LAB — TSH: TSH: 2.1 u[IU]/mL (ref 0.35–4.50)

## 2017-11-05 NOTE — Progress Notes (Signed)
Subjective:    Patient ID: Jennifer Donaldson, female    DOB: [birth year], 84 y.o.   MRN: [id number]  HPI  Patient presents for yearly preventative medicine examination. She is a very pleasant 84 year old female who  has a past medical history of C. difficile colitis, CAD (coronary artery disease), Dyslipidemia (12/24/2006), Hyperlipidemia, Mitral regurgitation, MITRAL REGURGITATION (08/12/2008), Osteopenia, Osteoporosis (03/01/2014), Raynaud's disease, RAYNAUD'S DISEASE (12/24/2006), and Special screening for malignant neoplasm of colon.  Hypertension - controlled with Norvasc 2.5 mg  BP Readings from Last 3 Encounters:  11/05/17 (!) 142/90  08/04/17 140/78  07/17/17 130/80   Hyperlipidemia/CAD - s/p PCI/DES to LAD and RCA  Nuclear scan in 2013 was normal . Controlled with statin and 81 mg ASA  Lab Results  Component Value Date   CHOL 190 09/11/2016   HDL 88.50 09/11/2016   LDLCALC 81 09/11/2016   LDLDIRECT 124.7 07/20/2012   TRIG 106.0 09/11/2016   CHOLHDL 2 09/11/2016   Osteoporosis - prescribed Fosamax by GYN   Trigger Finger - was seen by orthopedics multiple years ago and had steroid injections performed. This worked up until recently when her trigger finger(s) returned. Reports issues with right middle and ring finger as well as left ring finger. She reports pain when she has to " snap my finger back in place" and difficulty holding items such as car keys and grocery bags.   All immunizations and health maintenance protocols were reviewed with the patient and needed orders were placed. She is UTD on vaccinations   Appropriate screening laboratory values were ordered for the patient including screening of hyperlipidemia, renal function and hepatic function.  Medication reconciliation,  past medical history, social history, problem list and allergies were reviewed in detail with the patient  Goals were established with regard to weight loss, exercise, and  diet in compliance  with medications. She stays active but finds it difficult to find time to exercise due to being the main caregiver of her husband.   Wt Readings from Last 3 Encounters:  11/05/17 101 lb (45.8 kg)  08/04/17 104 lb 6.4 oz (47.4 kg)  07/17/17 104 lb 7 oz (47.4 kg)     End of life planning was discussed. She has an advanced directive and living will   She is UTD on mammogram, dental and vision screens.   Review of Systems  Constitutional: Negative.   HENT: Negative.   Eyes: Negative.   Respiratory: Negative.   Cardiovascular: Negative.   Gastrointestinal: Negative.   Endocrine: Negative.   Genitourinary: Negative.   Musculoskeletal: Positive for arthralgias and back pain.  Skin: Negative.   Allergic/Immunologic: Negative.   Neurological: Positive for weakness (bilateral hands).  Hematological: Negative.   Psychiatric/Behavioral: Negative.    Past Medical History:  Diagnosis Date  . C. difficile colitis   . CAD (coronary artery disease)   . Dyslipidemia 12/24/2006   Qualifier: Diagnosis of  By: Scherrie Gerlach    . Hyperlipidemia   . Mitral regurgitation   . MITRAL REGURGITATION 08/12/2008   Qualifier: Diagnosis of  By: Burnett Kanaris    . Osteopenia   . Osteoporosis 03/01/2014  . Raynaud's disease   . RAYNAUD'S DISEASE 12/24/2006   Qualifier: Diagnosis of  By: Scherrie Gerlach    . Special screening for malignant neoplasm of colon     Social History   Socioeconomic History  . Marital status: Married    Spouse name: Not on file  . Number of children: Not  on file  . Years of education: Not on file  . Highest education level: Not on file  Occupational History  . Not on file  Social Needs  . Financial resource strain: Not on file  . Food insecurity:    Worry: Not on file    Inability: Not on file  . Transportation needs:    Medical: Not on file    Non-medical: Not on file  Tobacco Use  . Smoking status: Former Smoker    Packs/day: 0.50    Years: 20.00     Pack years: 10.00    Types: Cigarettes    Last attempt to quit: 09/01/1983    Years since quitting: 34.2  . Smokeless tobacco: Never Used  Substance and Sexual Activity  . Alcohol use: No    Alcohol/week: 0.0 oz  . Drug use: No  . Sexual activity: Not on file  Lifestyle  . Physical activity:    Days per week: Not on file    Minutes per session: Not on file  . Stress: Not on file  Relationships  . Social connections:    Talks on phone: Not on file    Gets together: Not on file    Attends religious service: Not on file    Active member of club or organization: Not on file    Attends meetings of clubs or organizations: Not on file    Relationship status: Not on file  . Intimate partner violence:    Fear of current or ex partner: Not on file    Emotionally abused: Not on file    Physically abused: Not on file    Forced sexual activity: Not on file  Other Topics Concern  . Not on file  Social History Narrative   Retired Engineer, structural   Married for 22 years - husband has multiple medical issues and is disabled   4 children   Grew up in [place]    Past Surgical History:  Procedure Laterality Date  . BREAST EXCISIONAL BIOPSY Left   . CORONARY STENT PLACEMENT     2 prior drug eluting stent placed in the LAD and right coronary artery     Family History  Problem Relation Age of Onset  . CAD Brother   . Stroke Father   . CAD Mother   . Breast cancer Neg Hx     Allergies  Allergen Reactions  . Codeine Sulfate     REACTION: sensitivity to    Current Outpatient Medications on File Prior to Visit  Medication Sig Dispense Refill  . alendronate (FOSAMAX) 70 MG tablet Take 70 mg by mouth once a week.  11  . amLODipine (NORVASC) 2.5 MG tablet TAKE 1 TABLET (2.5 MG TOTAL) BY MOUTH DAILY. 90 tablet 0  . aspirin 81 MG tablet Take 81 mg by mouth daily.      Marland Kitchen atorvastatin (LIPITOR) 20 MG tablet TAKE 1 TABLET BY MOUTH EVERY DAY AT 6PM 30 tablet 1  . calcium  citrate-vitamin D (CITRACAL+D) 315-200 MG-UNIT per tablet Take 2 tablets by mouth daily.     . chlorhexidine (PERIDEX) 0.12 % solution Use as directed 5 mLs in the mouth or throat at bedtime as needed (when patient remembers).     . Cholecalciferol (VITAMIN D3) 1000 UNITS CAPS Take 1 tablet by mouth daily.      . cyclobenzaprine (FLEXERIL) 10 MG tablet TAKE 1 TABLET BY MOUTH THREE TIMES A DAY AS NEEDED FOR MUSCLE SPASMS 60 tablet  5  . doxycycline (VIBRAMYCIN) 50 MG capsule Take 50 mg by mouth daily.    . meloxicam (MOBIC) 15 MG tablet Take 1 tablet (15 mg total) by mouth daily. 30 tablet 0   No current facility-administered medications on file prior to visit.     BP (!) 142/90   Temp 97.6 F (36.4 C) (Oral)   Ht 4\' 10"  (1.473 m)   Wt 101 lb (45.8 kg)   BMI 21.11 kg/m       Objective:   Physical Exam  Constitutional: She is oriented to person, place, and time. She appears well-developed and well-nourished. No distress.  HENT:  Head: Normocephalic and atraumatic.  Right Ear: External ear normal.  Left Ear: External ear normal.  Nose: Nose normal.  Mouth/Throat: Oropharynx is clear and moist. No oropharyngeal exudate.  Eyes: Pupils are equal, round, and reactive to light. Conjunctivae and EOM are normal. Right eye exhibits no discharge. Left eye exhibits no discharge. No scleral icterus.  Neck: Normal range of motion. Neck supple. No JVD present. No tracheal deviation present. No thyromegaly present.  Cardiovascular: Normal rate, regular rhythm, normal heart sounds and intact distal pulses. Exam reveals no gallop and no friction rub.  No murmur heard. Pulmonary/Chest: Effort normal and breath sounds normal. No stridor. No respiratory distress. She has no wheezes. She has no rales. She exhibits no tenderness.  Abdominal: Soft. Bowel sounds are normal. She exhibits no distension and no mass. There is no tenderness. There is no rebound and no guarding. No hernia.  Musculoskeletal: Normal  range of motion. She exhibits no edema, tenderness or deformity.  Trigger finger noted on right middle and ring finger as well as left ring finger.   Lymphadenopathy:    She has no cervical adenopathy.  Neurological: She is alert and oriented to person, place, and time. She displays normal reflexes. No cranial nerve deficit or sensory deficit. She exhibits normal muscle tone. Coordination normal.  Skin: Skin is warm and dry. Capillary refill takes less than 2 seconds. No rash noted. She is not diaphoretic. No erythema. No pallor.  Psychiatric: She has a normal mood and affect. Her behavior is normal. Judgment and thought content normal.  Nursing note and vitals reviewed.     Assessment & Plan:  1. Routine general medical examination at a health care facility - Remarkable 84 year old female.  - Encouraged exercise and heart healthy diet.  - Follow up in one year or sooner if needed - Basic metabolic panel - CBC with Differential/Platelet - Hepatic function panel - Lipid panel - TSH  2. Dyslipidemia - Continue with statin and asa. Consider increase in statin  - Basic metabolic panel - CBC with Differential/Platelet - Hepatic function panel - Lipid panel - TSH  3. Coronary artery disease due to lipid rich plaque  - Basic metabolic panel - CBC with Differential/Platelet - Hepatic function panel - Lipid panel - TSH  4. Age-related osteoporosis without current pathological fracture - Continue with Fosamax  - Encouraged to stay active   5. Trigger finger of both hands  - AMB referral to orthopedics   Dorothyann Peng, NP

## 2017-11-06 ENCOUNTER — Other Ambulatory Visit: Payer: Self-pay | Admitting: Internal Medicine

## 2017-11-16 ENCOUNTER — Ambulatory Visit: Payer: PPO | Admitting: Internal Medicine

## 2017-11-17 ENCOUNTER — Telehealth: Payer: Self-pay | Admitting: Family Medicine

## 2017-11-17 NOTE — Telephone Encounter (Signed)
Copied from [place] [id number]. Topic: General - Other >> Nov 17, 2017  9:45 AM Carolyn Stare wrote:  Pt would like Misty to call her concerning some medicine she is taking for her back

## 2017-11-17 NOTE — Telephone Encounter (Signed)
Spoke to the pt.  She was calling to ask if it was ok that she continue to take the muscle relaxer 1/2 tab.  Advised that she could continue if she still continues to experience spasm.  Explained that sometimes it can take several days before she will notice the effects of the medication.  She is also taking Tylenol Extra Strength as needed.  She did say she is feeling better. Will call back if needed.  Will forward to [facility] as [place].

## 2017-11-24 ENCOUNTER — Encounter (INDEPENDENT_AMBULATORY_CARE_PROVIDER_SITE_OTHER): Payer: Self-pay | Admitting: Orthopaedic Surgery

## 2017-11-24 ENCOUNTER — Ambulatory Visit (INDEPENDENT_AMBULATORY_CARE_PROVIDER_SITE_OTHER): Payer: PPO | Admitting: Orthopaedic Surgery

## 2017-11-24 DIAGNOSIS — M65331 Trigger finger, right middle finger: Secondary | ICD-10-CM

## 2017-11-24 DIAGNOSIS — M65341 Trigger finger, right ring finger: Secondary | ICD-10-CM

## 2017-11-24 DIAGNOSIS — M65332 Trigger finger, left middle finger: Secondary | ICD-10-CM | POA: Diagnosis not present

## 2017-11-24 DIAGNOSIS — M65342 Trigger finger, left ring finger: Secondary | ICD-10-CM | POA: Diagnosis not present

## 2017-11-24 MED ORDER — BUPIVACAINE HCL 0.25 % IJ SOLN
0.3300 mL | INTRAMUSCULAR | Status: AC | PRN
  Administered 2017-11-24: .33 mL

## 2017-11-24 MED ORDER — LIDOCAINE HCL 1 % IJ SOLN
1.0000 mL | INTRAMUSCULAR | Status: AC | PRN
  Administered 2017-11-24: 1 mL

## 2017-11-24 MED ORDER — METHYLPREDNISOLONE ACETATE 40 MG/ML IJ SUSP
13.3300 mg | INTRAMUSCULAR | Status: AC | PRN
  Administered 2017-11-24: 13.33 mg

## 2017-11-24 NOTE — Progress Notes (Signed)
Office Visit Note   Patient: Jennifer Donaldson           Date of Birth: 1932-10-19           MRN: [id number] Visit Date: 11/24/2017              Requested by: Dorothyann Peng, NP [address] [place], [place] [address] PCP: Dorothyann Peng, NP   Assessment & Plan: Visit Diagnoses:  1. Trigger finger, left middle finger   2. Trigger finger, left ring finger   3. Trigger finger, right middle finger   4. Trigger finger, right ring finger     Plan: Impression is right and left long and ring trigger fingers.  We will inject all 4 of these today with cortisone.  We have discussed surgical intervention to include A1 pulley release.  Due to the fact the patient is the primary caregiver of her disabled husband, she would like to try injections first as it will be hard for her to arrange help if she were to have surgical intervention.  She will follow-up with [place] as needed.  Call with questions or concerns in the meantime.  Follow-Up Instructions: Return if symptoms worsen or fail to improve.   Orders:  Orders Placed This Encounter  Procedures  . Hand/UE Inj: R long A1  . Hand/UE Inj  . Hand/UE Inj: L long A1  . Hand/UE Inj: L ring A1   No orders of the defined types were placed in this encounter.     Procedures: Hand/UE Inj: R long A1 for trigger finger on 11/24/2017 10:50 AM Indications: pain Details: 25 G needle Medications: 1 mL lidocaine 1 %; 0.33 mL bupivacaine 0.25 %; 13.33 mg methylPREDNISolone acetate 40 MG/ML  Hand/UE Inj for trigger finger on 11/24/2017 10:50 AM Indications: pain Details: 25 G needle Medications: 1 mL lidocaine 1 %; 0.33 mL bupivacaine 0.25 %; 13.33 mg methylPREDNISolone acetate 40 MG/ML  Hand/UE Inj: L long A1 for trigger finger on 11/24/2017 10:50 AM Indications: pain Details: 25 G needle Medications: 1 mL lidocaine 1 %; 0.33 mL bupivacaine 0.25 %; 13.33 mg methylPREDNISolone acetate 40 MG/ML  Hand/UE Inj: L ring A1 for trigger finger on  11/24/2017 10:51 AM Indications: pain Details: 25 G needle Medications: 0.33 mL bupivacaine 0.25 %; 1 mL lidocaine 1 %; 13.33 mg methylPREDNISolone acetate 40 MG/ML      Clinical Data: No additional findings.   Subjective: Chief Complaint  Patient presents with  . Right Hand - Pain    Ring and middle finger triggering  . Left Hand - Pain    Ring and middle finger triggering    HPI patient is a pleasant 84 year old right-hand-dominant female who presents to our clinic today with triggering to the right long and ring fingers as well as the left long and ring fingers.  History of trigger finger to all 4 of these fingers several years back.  She did have these injected with cortisone which significantly helped until recently.  Her pain has returned and all 4 fingers have started to lock.  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  Review of Systems as detailed in HPI.  All others reviewed and are negative.   Objective: Vital Signs: There were no vitals taken for this visit.  Physical Exam well-developed well-nourished female in no acute distress.  Alert and oriented x3.  Ortho Exam examination of her right long and  ring fingers reveal a tender and palpable nodule over the A1 pulleys.  She does exhibit noticeable locking.  Examination of her left long and ring fingers also show a tender and palpable nodule at the A1 pulley.  She is exhibiting walking to those fingers as well.  No tenderness over the first dorsal compartment or CMC joint.  Full range of motion otherwise.  She is neurovascularly intact distally.  Specialty Comments:  No specialty comments available.  Imaging: No new imaging   PMFS History: Patient Active Problem List   Diagnosis Date Noted  . Trigger finger, right ring finger 11/24/2017  . Follow up 08/30/2014  . Osteoporosis 03/01/2014  . MITRAL REGURGITATION 08/12/2008  . CAD (coronary  artery disease) 08/12/2008  . Dyslipidemia 12/24/2006  . RAYNAUD'S DISEASE 12/24/2006   Past Medical History:  Diagnosis Date  . C. difficile colitis   . CAD (coronary artery disease)   . Dyslipidemia 12/24/2006   Qualifier: Diagnosis of  By: Scherrie Gerlach    . Hyperlipidemia   . Mitral regurgitation   . MITRAL REGURGITATION 08/12/2008   Qualifier: Diagnosis of  By: Burnett Kanaris    . Osteopenia   . Osteoporosis 03/01/2014  . Raynaud's disease   . RAYNAUD'S DISEASE 12/24/2006   Qualifier: Diagnosis of  By: Scherrie Gerlach    . Special screening for malignant neoplasm of colon     Family History  Problem Relation Age of Onset  . CAD Brother   . Stroke Father   . CAD Mother   . Breast cancer Neg Hx     Past Surgical History:  Procedure Laterality Date  . BREAST EXCISIONAL BIOPSY Left   . CORONARY STENT PLACEMENT     2 prior drug eluting stent placed in the LAD and right coronary artery    Social History   Occupational History  . Not on file  Tobacco Use  . Smoking status: Former Smoker    Packs/day: 0.50    Years: 20.00    Pack years: 10.00    Types: Cigarettes    Last attempt to quit: 09/01/1983    Years since quitting: 34.2  . Smokeless tobacco: Never Used  Substance and Sexual Activity  . Alcohol use: No    Alcohol/week: 0.0 standard drinks  . Drug use: No  . Sexual activity: Not on file

## 2017-11-30 ENCOUNTER — Ambulatory Visit: Payer: PPO | Admitting: Internal Medicine

## 2017-11-30 ENCOUNTER — Encounter

## 2017-11-30 ENCOUNTER — Encounter: Payer: Self-pay | Admitting: Internal Medicine

## 2017-11-30 VITALS — BP 148/72 | HR 87 | Ht <= 58 in | Wt 99.4 lb

## 2017-11-30 DIAGNOSIS — I10 Essential (primary) hypertension: Secondary | ICD-10-CM

## 2017-11-30 DIAGNOSIS — I2510 Atherosclerotic heart disease of native coronary artery without angina pectoris: Secondary | ICD-10-CM | POA: Diagnosis not present

## 2017-11-30 DIAGNOSIS — E782 Mixed hyperlipidemia: Secondary | ICD-10-CM | POA: Diagnosis not present

## 2017-11-30 MED ORDER — EZETIMIBE 10 MG PO TABS: 10.0000 mg | ORAL_TABLET | Freq: Every day | ORAL | 3 refills | Status: DC

## 2017-11-30 NOTE — Patient Instructions (Signed)
Your physician has recommended you make the following change in your medication:  1.) start zetia 10 mg for cholesterol  Your physician recommends that you return for lab work in: about 3 months ([place])  Your physician wants you to follow-up in: 1 year with Dr. Harrington Challenger.  You will receive a reminder letter in the mail two months in advance. If you don't receive a letter, please call our office to schedule the follow-up appointment.

## 2017-11-30 NOTE — Progress Notes (Signed)
Cardiology Office Note   Date:  11/30/2017   ID:  Jennifer Donaldson, DOB [birth year], MRN [id number]  PCP:  Dorothyann Peng, NP  Cardiologist:   Dorris Carnes, MD   No chief complaint on file. F?U of CAD      History of Present Illness: Jennifer Donaldson is a 84 y.o. female with a history of CAD  She wa previously seen by Lowella Dell  I saw her in April 2016 Pt is s/p PCI/DES to LAD and RCA  Nuclear scan in 2013 was normal   I saw the pt in clinic in July 2018   HDL 89  LDL 81   (11/08/17)    Pt feeling good   No CP   No dizziness  No SOB   No palpijtations Busy caring for husband    Last lipids LDL 81, HDL 89   Says she eats out a lot   Outpatient Medications Prior to Visit  Medication Sig Dispense Refill  . alendronate (FOSAMAX) 70 MG tablet Take 70 mg by mouth once a week.  11  . amLODipine (NORVASC) 2.5 MG tablet TAKE 1 TABLET (2.5 MG TOTAL) BY MOUTH DAILY. 90 tablet 0  . aspirin 81 MG tablet Take 81 mg by mouth daily.      Marland Kitchen atorvastatin (LIPITOR) 20 MG tablet Take 1 tablet (20 mg total) by mouth daily at 6 PM. Please keep upcoming appt with Dr. Harrington Challenger in August for future refills. Thank you 30 tablet 0  . calcium citrate-vitamin D (CITRACAL+D) 315-200 MG-UNIT per tablet Take 2 tablets by mouth daily.     . chlorhexidine (PERIDEX) 0.12 % solution Use as directed 5 mLs in the mouth or throat at bedtime as needed (when patient remembers).     . Cholecalciferol (VITAMIN D3) 1000 UNITS CAPS Take 1 tablet by mouth daily.      . cyclobenzaprine (FLEXERIL) 10 MG tablet TAKE 1 TABLET BY MOUTH THREE TIMES A DAY AS NEEDED FOR MUSCLE SPASMS 60 tablet 5  . doxycycline (VIBRAMYCIN) 50 MG capsule Take 50 mg by mouth daily.    . meloxicam (MOBIC) 15 MG tablet Take 1 tablet (15 mg total) by mouth daily. 30 tablet 0   No facility-administered medications prior to visit.      Allergies:   Codeine sulfate   Past Medical History:  Diagnosis Date  . C. difficile colitis   . CAD  (coronary artery disease)   . Dyslipidemia 12/24/2006   Qualifier: Diagnosis of  By: Scherrie Gerlach    . Hyperlipidemia   . Mitral regurgitation   . MITRAL REGURGITATION 08/12/2008   Qualifier: Diagnosis of  By: Burnett Kanaris    . Osteopenia   . Osteoporosis 03/01/2014  . Raynaud's disease   . RAYNAUD'S DISEASE 12/24/2006   Qualifier: Diagnosis of  By: Scherrie Gerlach    . Special screening for malignant neoplasm of colon     Past Surgical History:  Procedure Laterality Date  . BREAST EXCISIONAL BIOPSY Left   . CORONARY STENT PLACEMENT     2 prior drug eluting stent placed in the LAD and right coronary artery      Social History:  The patient  reports that she quit smoking about 34 years ago. Her smoking use included cigarettes. She has a 10.00 pack-year smoking history. She has never used smokeless tobacco. She reports that she does not drink alcohol or use drugs.   Family History:  The patient's family history includes CAD  in her brother and mother; Stroke in her father.    ROS:  Please see the history of present illness. All other systems are reviewed and  Negative to the above problem except as noted.    PHYSICAL EXAM: VS:  BP (!) 148/72 (BP Location: Left Arm, Patient Position: Sitting, Cuff Size: Normal)   Pulse 87   Ht 4\' 10"  (1.473 m)   Wt 99 lb 6.4 oz (45.1 kg)   BMI 20.77 kg/m   GEN  Thin 84 yo n no acute distress HEENT: normal Neck: JVP is normal   No carotid bruits, or masses Cardiac: RRR; no murmurs, rubs, or gallops,no edema  Respiratory:  clear to auscultation bilaterally, normal work of breathing GI: soft, nontender, nondistended, + BS  No hepatomegaly  MS: no deformity Moving all extremities   Skin: warm and dry, no rash Neuro:  Strength and sensation are intact Psych: euthymic mood, full affect   EKG:  EKG is ordered today.  SR 87 bpm     Lipid Panel    Component Value Date/Time   CHOL 182 11/05/2017 1101   TRIG 61.0 11/05/2017 1101   HDL  89.20 11/05/2017 1101   CHOLHDL 2 11/05/2017 1101   VLDL 12.2 11/05/2017 1101   LDLCALC 81 11/05/2017 1101   LDLDIRECT 124.7 07/20/2012 0958      Wt Readings from Last 3 Encounters:  11/30/17 99 lb 6.4 oz (45.1 kg)  11/05/17 101 lb (45.8 kg)  08/04/17 104 lb 6.4 oz (47.4 kg)      ASSESSMENT AND PLAN:  1  CAD No symptoms to sugg angina  Keep on same regimen   2.  HTN  BP is controlled   3.  HL Discussed diet Would add Zeita to regimen   Check lpids in 8 wks    F/U in 1 year    Signed, Dorris Carnes, MD  11/30/2017 2:16 PM    [address], [place], [place]  [address] Phone: [phone]; Fax: [phone]

## 2017-12-02 NOTE — Addendum Note (Signed)
Addended by: Patterson Hammersmith A on: 12/02/2017 09:47 AM   Modules accepted: Orders

## 2017-12-09 ENCOUNTER — Other Ambulatory Visit: Payer: Self-pay | Admitting: Internal Medicine

## 2018-01-21 ENCOUNTER — Other Ambulatory Visit: Payer: Self-pay | Admitting: Adult Health

## 2018-01-22 NOTE — Telephone Encounter (Signed)
Sent to the pharmacy by e-scribe. 

## 2018-02-09 ENCOUNTER — Ambulatory Visit (INDEPENDENT_AMBULATORY_CARE_PROVIDER_SITE_OTHER): Payer: PPO

## 2018-02-09 DIAGNOSIS — Z23 Encounter for immunization: Secondary | ICD-10-CM

## 2018-02-16 ENCOUNTER — Other Ambulatory Visit: Payer: PPO

## 2018-02-16 DIAGNOSIS — E782 Mixed hyperlipidemia: Secondary | ICD-10-CM

## 2018-02-16 LAB — LIPID PANEL
Chol/HDL Ratio: 1.7 ratio (ref 0.0–4.4)
Cholesterol, Total: 166 mg/dL (ref 100–199)
HDL: 97 mg/dL (ref >39)
LDL Calculated: 56 mg/dL (ref 0–99)
Triglycerides: 67 mg/dL (ref 0–149)
VLDL Cholesterol Cal: 13 mg/dL (ref 5–40)

## 2018-02-17 ENCOUNTER — Encounter: Payer: Self-pay | Admitting: *Deleted

## 2018-02-26 ENCOUNTER — Other Ambulatory Visit: Payer: Self-pay | Admitting: Adult Health

## 2018-02-26 MED ORDER — ALPRAZOLAM 0.25 MG PO TABS: 0.2500 mg | ORAL_TABLET | Freq: Two times a day (BID) | ORAL | 2 refills | Status: DC | PRN

## 2018-03-17 ENCOUNTER — Other Ambulatory Visit: Payer: Self-pay | Admitting: Adult Health

## 2018-03-17 NOTE — Telephone Encounter (Signed)
Spoke to the pt.  She continues to occasionally use this medication.

## 2018-04-13 ENCOUNTER — Other Ambulatory Visit: Payer: Self-pay | Admitting: Adult Health

## 2018-04-30 ENCOUNTER — Telehealth: Payer: Self-pay

## 2018-04-30 NOTE — Telephone Encounter (Signed)
Author phoned pt. to attempt to reschedule 4/10 AWV appointment. No answer, unable to leave VM on home phone. Author left detailed VM on cell asking for return call to reschedule for July or after.

## 2018-07-06 ENCOUNTER — Other Ambulatory Visit: Payer: Self-pay | Admitting: Adult Health

## 2018-07-07 ENCOUNTER — Telehealth: Payer: Self-pay

## 2018-07-07 NOTE — Telephone Encounter (Signed)
Author phoned pt to offer to do virtual awv with PCP. Pt. Stated she has been overwhelmed with taking care of her husband, and does not feel comfortable using her phone for virtual visits, but would reschedule in-person appointment for later in year. Appointment rescheduled for 8/13.

## 2018-07-07 NOTE — Telephone Encounter (Signed)
Sent to the pharmacy by e-scribe. 

## 2018-07-23 ENCOUNTER — Ambulatory Visit: Payer: PPO

## 2018-08-24 ENCOUNTER — Ambulatory Visit: Payer: Self-pay | Admitting: *Deleted

## 2018-08-24 ENCOUNTER — Other Ambulatory Visit: Payer: Self-pay

## 2018-08-24 ENCOUNTER — Ambulatory Visit (INDEPENDENT_AMBULATORY_CARE_PROVIDER_SITE_OTHER): Payer: PPO | Admitting: Adult Health

## 2018-08-24 DIAGNOSIS — R1012 Left upper quadrant pain: Secondary | ICD-10-CM | POA: Diagnosis not present

## 2018-08-24 NOTE — Telephone Encounter (Signed)
  Patient is calling with abdominal pain that she has been treating with Pepto for 1 week now. It is concerning to her because the pain is not resolving and she is awaking at night with it. Patient would like to be seen for her complaint. Call to office to see if they want to see patient for this- disposition for abdominal pain is ED- they will schedule patient first and evaluate her needs. Reason for Disposition . [1] SEVERE pain AND [2] age > 2  Answer Assessment - Initial Assessment Questions 1. LOCATION: "Where does it hurt?"      All over- and into throat- some nausea- "like a knot" 2. RADIATION: "Does the pain shoot anywhere else?" (e.g., chest, back)     No- all in abdominal area- almost like a reflux- but full sensation 3. ONSET: "When did the pain begin?" (e.g., minutes, hours or days ago)      Started 1 week ago- has continued- Pepto helped 4. SUDDEN: "Gradual or sudden onset?"     Sudden- 1 day started hurting and has not stopped 5. PATTERN "Does the pain come and go, or is it constant?"    - If constant: "Is it getting better, staying the same, or worsening?"      (Note: Constant means the pain never goes away completely; most serious pain is constant and it progresses)     - If intermittent: "How long does it last?" "Do you have pain now?"     (Note: Intermittent means the pain goes away completely between bouts)     Pain goes away with Pepto- will come back in middle of night 6. SEVERITY: "How bad is the pain?"  (e.g., Scale 1-10; mild, moderate, or severe)   - MILD (1-3): doesn't interfere with normal activities, abdomen soft and not tender to touch    - MODERATE (4-7): interferes with normal activities or awakens from sleep, tender to touch    - SEVERE (8-10): excruciating pain, doubled over, unable to do any normal activities      Moderate to severe at times 7. RECURRENT SYMPTOM: "Have you ever had this type of abdominal pain before?" If so, ask: "When was the last time?"  and "What happened that time?"      no 8. CAUSE: "What do you think is causing the abdominal pain?"     Patient has not had regular BM- in past couple days- she is using metamucil  9. RELIEVING/AGGRAVATING FACTORS: "What makes it better or worse?" (e.g., movement, antacids, bowel movement)     Pepto- helped 10. OTHER SYMPTOMS: "Has there been any vomiting, diarrhea, constipation, or urine problems?"       No other symptoms 11. PREGNANCY: "Is there any chance you are pregnant?" "When was your last menstrual period?"       n/a  Protocols used: ABDOMINAL PAIN - [facility]

## 2018-08-24 NOTE — Progress Notes (Signed)
Virtual Visit via Telephone Note  I connected with Jennifer Donaldson on 08/24/18 at  1:00 PM EDT by telephone and verified that I am speaking with the correct person using two identifiers.   I discussed the limitations, risks, security and privacy concerns of performing an evaluation and management service by telephone and the availability of in person appointments. I also discussed with the patient that there may be a patient responsible charge related to this service. The patient expressed understanding and agreed to proceed.  Location patient: home Location provider: work or home office Participants present for the call: patient, provider Patient did not have a visit in the prior 7 days to address this/these issue(s).   History of Present Illness: 85 year old female who  has a past medical history of C. difficile colitis, CAD (coronary artery disease), Dyslipidemia (12/24/2006), Hyperlipidemia, Mitral regurgitation, MITRAL REGURGITATION (08/12/2008), Osteopenia, Osteoporosis (03/01/2014), Raynaud's disease, RAYNAUD'S DISEASE (12/24/2006), and Special screening for malignant neoplasm of colon.  She presents today for evaluation of intermittent abdominal pain x1 week.  Nominal pain is described as "cramping".  She does report some distention and pain is localized to the left lower quadrant as well as mid upper abdomen.  She denies any nausea or vomiting, diarrhea, fevers or chills.  Burping and having a bowel movement improve the pain.  She denies a burning sensation in her throat or waking up with a sour taste in her mouth.  She does report that her bowel movements have been happening less then she is accustomed to.  Her last bowel movement was yesterday, but "there was not a whole lot".  He has also been using Pepto-Bismol which gives her relief from the abdominal pain.   Observations/Objective: Patient sounds cheerful and well on the phone. I do not appreciate any SOB. Speech and thought  processing are grossly intact. Patient reported vitals:  Assessment and Plan: 1. Left upper quadrant pain -He does not want to come into the office today nor does she want to go to the emergency room.  Possible constipation, she was advised to drink some prune juice or can use an over-the-counter laxative.  We will follow-up with her tomorrow to see how she is feeling.   Follow Up Instructions:  I did not refer this patient for an OV in the next 24 hours for this/these issue(s).  I discussed the assessment and treatment plan with the patient. The patient was provided an opportunity to ask questions and all were answered. The patient agreed with the plan and demonstrated an understanding of the instructions.   The patient was advised to call back or seek an in-person evaluation if the symptoms worsen or if the condition fails to improve as anticipated.  I provided 25 minutes of non-face-to-face time during this encounter.   Dorothyann Peng, NP

## 2018-08-24 NOTE — Telephone Encounter (Signed)
Appointment made

## 2018-08-25 ENCOUNTER — Telehealth: Payer: Self-pay | Admitting: Adult Health

## 2018-08-25 ENCOUNTER — Encounter: Payer: Self-pay | Admitting: Adult Health

## 2018-08-25 NOTE — Telephone Encounter (Signed)
Followed up with patient about her abdominal pain. She had a large bowel movement last night and again this morning. She reports that she is feeling better.

## 2018-09-21 DIAGNOSIS — D229 Melanocytic nevi, unspecified: Secondary | ICD-10-CM | POA: Diagnosis not present

## 2018-09-21 DIAGNOSIS — L570 Actinic keratosis: Secondary | ICD-10-CM | POA: Diagnosis not present

## 2018-09-21 DIAGNOSIS — L814 Other melanin hyperpigmentation: Secondary | ICD-10-CM | POA: Diagnosis not present

## 2018-09-21 DIAGNOSIS — L821 Other seborrheic keratosis: Secondary | ICD-10-CM | POA: Diagnosis not present

## 2018-09-21 DIAGNOSIS — L820 Inflamed seborrheic keratosis: Secondary | ICD-10-CM | POA: Diagnosis not present

## 2018-10-14 ENCOUNTER — Other Ambulatory Visit: Payer: Self-pay | Admitting: Adult Health

## 2018-10-14 NOTE — Telephone Encounter (Signed)
Tried to reach the pt by telephone.  Pt due for cpx.  Received an automated message asking me to put in my access code.  Will try again at a later time.

## 2018-10-20 NOTE — Telephone Encounter (Signed)
Sent to the pharmacy by e-scribe for 90 days.  She is now scheduled for cpx.

## 2018-10-29 ENCOUNTER — Other Ambulatory Visit: Payer: Self-pay | Admitting: Obstetrics and Gynecology

## 2018-10-29 DIAGNOSIS — Z1231 Encounter for screening mammogram for malignant neoplasm of breast: Secondary | ICD-10-CM

## 2018-11-18 ENCOUNTER — Ambulatory Visit
Admission: RE | Admit: 2018-11-18 | Discharge: 2018-11-18 | Disposition: A | Discharge status: Home / Self Care | Payer: PPO | Source: Ambulatory Visit | Attending: Obstetrics and Gynecology | Admitting: Obstetrics and Gynecology

## 2018-11-18 ENCOUNTER — Other Ambulatory Visit: Payer: Self-pay

## 2018-11-18 DIAGNOSIS — Z1231 Encounter for screening mammogram for malignant neoplasm of breast: Secondary | ICD-10-CM | POA: Diagnosis not present

## 2018-11-25 ENCOUNTER — Ambulatory Visit: Payer: PPO

## 2018-12-01 ENCOUNTER — Other Ambulatory Visit: Payer: Self-pay | Admitting: Internal Medicine

## 2018-12-01 NOTE — Progress Notes (Deleted)
Cardiology Office Note   Date:  12/01/2018   ID:  Jennifer Donaldson, DOB 09-06-1932, MRN [id number]  PCP:  Dorothyann Peng, NP  Cardiologist:   Dorris Carnes, MD   No chief complaint on file. F?U of CAD      History of Present Illness: Jennifer Donaldson is a 85 y.o. female with a history of CAD  She wa previously seen by Lowella Dell  I saw her in April 2016 Pt is s/p PCI/DES to LAD and RCA  Nuclear scan in 2013 was normal   I saw the pt in clinic in Summer 2019    Outpatient Medications Prior to Visit  Medication Sig Dispense Refill  . alendronate (FOSAMAX) 70 MG tablet Take 70 mg by mouth once a week.  11  . ALPRAZolam (XANAX) 0.25 MG tablet Take 1 tablet (0.25 mg total) by mouth 2 (two) times daily as needed for anxiety. 20 tablet 2  . amLODipine (NORVASC) 2.5 MG tablet TAKE 1 TABLET (2.5 MG TOTAL) BY MOUTH DAILY. 90 tablet 0  . aspirin 81 MG tablet Take 81 mg by mouth daily.      Marland Kitchen atorvastatin (LIPITOR) 20 MG tablet Take 1 tablet (20 mg total) by mouth daily at 6 PM. 90 tablet 3  . calcium citrate-vitamin D (CITRACAL+D) 315-200 MG-UNIT per tablet Take 2 tablets by mouth daily.     . chlorhexidine (PERIDEX) 0.12 % solution Use as directed 5 mLs in the mouth or throat at bedtime as needed (when patient remembers).     . Cholecalciferol (VITAMIN D3) 1000 UNITS CAPS Take 1 tablet by mouth daily.      . cyclobenzaprine (FLEXERIL) 10 MG tablet TAKE 1 TABLET BY MOUTH THREE TIMES A DAY AS NEEDED FOR MUSCLE SPASMS 60 tablet 5  . doxycycline (VIBRAMYCIN) 50 MG capsule Take 50 mg by mouth daily.    Marland Kitchen ezetimibe (ZETIA) 10 MG tablet Take 1 tablet (10 mg total) by mouth daily. 90 tablet 3  . meloxicam (MOBIC) 15 MG tablet TAKE 1 (ONE) TABLET DAILY AS NEEDED FOR PAIN 90 tablet 1   No facility-administered medications prior to visit.      Allergies:   Codeine sulfate   Past Medical History:  Diagnosis Date  . C. difficile colitis   . CAD (coronary artery disease)   . Dyslipidemia  12/24/2006   Qualifier: Diagnosis of  By: Scherrie Gerlach    . Hyperlipidemia   . Mitral regurgitation   . MITRAL REGURGITATION 08/12/2008   Qualifier: Diagnosis of  By: Burnett Kanaris    . Osteopenia   . Osteoporosis 03/01/2014  . Raynaud's disease   . RAYNAUD'S DISEASE 12/24/2006   Qualifier: Diagnosis of  By: Scherrie Gerlach    . Special screening for malignant neoplasm of colon     Past Surgical History:  Procedure Laterality Date  . BREAST EXCISIONAL BIOPSY Left   . CORONARY STENT PLACEMENT     2 prior drug eluting stent placed in the LAD and right coronary artery      Social History:  The patient  reports that she quit smoking about 35 years ago. Her smoking use included cigarettes. She has a 10.00 pack-year smoking history. She has never used smokeless tobacco. She reports that she does not drink alcohol or use drugs.   Family History:  The patient's family history includes CAD in her brother and mother; Stroke in her father.    ROS:  Please see the history of present  illness. All other systems are reviewed and  Negative to the above problem except as noted.    PHYSICAL EXAM: VS:  There were no vitals taken for this visit.  GEN  Thin 85 yo n no acute distress HEENT: normal Neck: JVP is normal   No carotid bruits, or masses Cardiac: RRR; no murmurs, rubs, or gallops,no edema  Respiratory:  clear to auscultation bilaterally, normal work of breathing GI: soft, nontender, nondistended, + BS  No hepatomegaly  MS: no deformity Moving all extremities   Skin: warm and dry, no rash Neuro:  Strength and sensation are intact Psych: euthymic mood, full affect   EKG:  EKG is ordered today.  SR 87 bpm     Lipid Panel    Component Value Date/Time   CHOL 166 02/16/2018 0801   TRIG 67 02/16/2018 0801   HDL 97 02/16/2018 0801   CHOLHDL 1.7 02/16/2018 0801   CHOLHDL 2 11/05/2017 1101   VLDL 12.2 11/05/2017 1101   LDLCALC 56 02/16/2018 0801   LDLDIRECT 124.7 07/20/2012 0958       Wt Readings from Last 3 Encounters:  11/30/17 99 lb 6.4 oz (45.1 kg)  11/05/17 101 lb (45.8 kg)  08/04/17 104 lb 6.4 oz (47.4 kg)      ASSESSMENT AND PLAN:  1  CAD No symptoms to sugg angina  Keep on same regimen   2.  HTN  BP is controlled   3.  HL Discussed diet Would add Zeita to regimen   Check lpids in 8 wks    F/U in 1 year    Signed, Dorris Carnes, MD  12/01/2018 12:25 AM    [address], [place], [place]  [address] Phone: [phone]; Fax: [phone]

## 2018-12-02 ENCOUNTER — Ambulatory Visit: Payer: PPO | Admitting: Internal Medicine

## 2019-01-06 ENCOUNTER — Telehealth: Payer: Self-pay | Admitting: *Deleted

## 2019-01-06 NOTE — Telephone Encounter (Signed)

## 2019-01-07 ENCOUNTER — Telehealth: Payer: Self-pay

## 2019-01-07 NOTE — Telephone Encounter (Signed)
Spoke with pt and reviewed meds. Informed pt to have vitals prior to appt with Dr. Harrington Challenger.  Virtual Visit Pre-Appointment Phone Call  "(Name), I am calling you today to discuss your upcoming appointment. We are currently trying to limit exposure to the virus that causes COVID-19 by seeing patients at home rather than in the office."  1. "What is the BEST phone number to call the day of the visit?" - include this in appointment notes  2. "Do you have or have access to (through a family member/friend) a smartphone with video capability that we can use for your visit?" a. If yes - list this number in appt notes as "cell" (if different from BEST phone #) and list the appointment type as a VIDEO visit in appointment notes b. If no - list the appointment type as a PHONE visit in appointment notes  3. Confirm consent - "In the setting of the current Covid19 crisis, you are scheduled for a (phone or video) visit with your provider on (date) at (time).  Just as we do with many in-office visits, in order for you to participate in this visit, we must obtain consent.  If you'd like, I can send this to your mychart (if signed up) or email for you to review.  Otherwise, I can obtain your verbal consent now.  All virtual visits are billed to your insurance company just like a normal visit would be.  By agreeing to a virtual visit, we'd like you to understand that the technology does not allow for your provider to perform an examination, and thus may limit your provider's ability to fully assess your condition. If your provider identifies any concerns that need to be evaluated in person, we will make arrangements to do so.  Finally, though the technology is pretty good, we cannot assure that it will always work on either your or our end, and in the setting of a video visit, we may have to convert it to a phone-only visit.  In either situation, we cannot ensure that we have a secure connection.  Are you willing to  proceed?" STAFF: Did the patient verbally acknowledge consent to telehealth visit? Document YES/NO here: Yes  4. Advise patient to be prepared - "Two hours prior to your appointment, go ahead and check your blood pressure, pulse, oxygen saturation, and your weight (if you have the equipment to check those) and write them all down. When your visit starts, your provider will ask you for this information. If you have an Apple Watch or Kardia device, please plan to have heart rate information ready on the day of your appointment. Please have a pen and paper handy nearby the day of the visit as well."  5. Give patient instructions for MyChart download to smartphone OR Doximity/Doxy.me as below if video visit (depending on what platform provider is using)  6. Inform patient they will receive a phone call 15 minutes prior to their appointment time (may be from unknown caller ID) so they should be prepared to answer    TELEPHONE CALL NOTE  Jennifer Donaldson has been deemed a candidate for a follow-up tele-health visit to limit community exposure during the Covid-19 pandemic. I spoke with the patient via phone to ensure availability of phone/video source, confirm preferred email & phone number, and discuss instructions and expectations.  I reminded Jennifer Donaldson to be prepared with any vital sign and/or heart rhythm information that could potentially be obtained via home monitoring,  at the time of her visit. I reminded Jennifer Donaldson to expect a phone call prior to her visit.  [name], [place] 01/07/2019 3:45 PM   INSTRUCTIONS FOR DOWNLOADING THE MYCHART APP TO SMARTPHONE  - The patient must first make sure to have activated MyChart and know their login information - If Apple, go to [employer] and type in MyChart in the search bar and download the app. If Android, ask patient to go to [employer] and type in [place] in the search bar and download the app. The app is free but as  with any other app downloads, their phone may require them to verify saved payment information or Apple/Android password.  - The patient will need to then log into the app with their MyChart username and password, and select [facility] as their healthcare provider to link the account. When it is time for your visit, go to the MyChart app, find appointments, and click Begin Video Visit. Be sure to Select Allow for your device to access the Microphone and Camera for your visit. You will then be connected, and your provider will be with you shortly.  **If they have any issues connecting, or need assistance please contact MyChart service desk [phone])**  **If using a computer, in order to ensure the best quality for their visit they will need to use either of the following Internet Browsers: [employer], or Google Chrome**  IF USING DOXIMITY or DOXY.ME - The patient will receive a link just prior to their visit by text.     FULL LENGTH CONSENT FOR TELE-HEALTH VISIT   I hereby voluntarily request, consent and authorize [facility] and its employed or contracted physicians, physician assistants, nurse practitioners or other licensed health care professionals (the Practitioner), to provide me with telemedicine health care services (the "Services") as deemed necessary by the treating Practitioner. I acknowledge and consent to receive the Services by the Practitioner via telemedicine. I understand that the telemedicine visit will involve communicating with the Practitioner through live audiovisual communication technology and the disclosure of certain medical information by electronic transmission. I acknowledge that I have been given the opportunity to request an in-person assessment or other available alternative prior to the telemedicine visit and am voluntarily participating in the telemedicine visit.  I understand that I have the right to withhold or withdraw my consent to the  use of telemedicine in the course of my care at any time, without affecting my right to future care or treatment, and that the Practitioner or I may terminate the telemedicine visit at any time. I understand that I have the right to inspect all information obtained and/or recorded in the course of the telemedicine visit and may receive copies of available information for a reasonable fee.  I understand that some of the potential risks of receiving the Services via telemedicine include:  Marland Kitchen Delay or interruption in medical evaluation due to technological equipment failure or disruption; . Information transmitted may not be sufficient (e.g. poor resolution of images) to allow for appropriate medical decision making by the Practitioner; and/or  . In rare instances, security protocols could fail, causing a breach of personal health information.  Furthermore, I acknowledge that it is my responsibility to provide information about my medical history, conditions and care that is complete and accurate to the best of my ability. I acknowledge that Practitioner's advice, recommendations, and/or decision may be based on factors not within their control, such as incomplete or  inaccurate data provided by me or distortions of diagnostic images or specimens that may result from electronic transmissions. I understand that the practice of medicine is not an exact science and that Practitioner makes no warranties or guarantees regarding treatment outcomes. I acknowledge that I will receive a copy of this consent concurrently upon execution via email to the email address I last provided but may also request a printed copy by calling the office of [facility].    I understand that my insurance will be billed for this visit.   I have read or had this consent read to me. . I understand the contents of this consent, which adequately explains the benefits and risks of the Services being provided via telemedicine.  . I have  been provided ample opportunity to ask questions regarding this consent and the Services and have had my questions answered to my satisfaction. . I give my informed consent for the services to be provided through the use of telemedicine in my medical care  By participating in this telemedicine visit I agree to the above.

## 2019-01-08 ENCOUNTER — Other Ambulatory Visit: Payer: Self-pay | Admitting: Adult Health

## 2019-01-10 ENCOUNTER — Encounter: Payer: Self-pay | Admitting: Internal Medicine

## 2019-01-10 ENCOUNTER — Telehealth (INDEPENDENT_AMBULATORY_CARE_PROVIDER_SITE_OTHER): Payer: PPO | Admitting: Internal Medicine

## 2019-01-10 ENCOUNTER — Other Ambulatory Visit: Payer: Self-pay

## 2019-01-10 VITALS — BP 138/70 | HR 81 | Wt 95.0 lb

## 2019-01-10 DIAGNOSIS — E782 Mixed hyperlipidemia: Secondary | ICD-10-CM

## 2019-01-10 DIAGNOSIS — I2510 Atherosclerotic heart disease of native coronary artery without angina pectoris: Secondary | ICD-10-CM

## 2019-01-10 DIAGNOSIS — I10 Essential (primary) hypertension: Secondary | ICD-10-CM

## 2019-01-10 NOTE — Patient Instructions (Signed)
Medication Instructions:  No changes today If you need a refill on your cardiac medications before your next appointment, please call your pharmacy.   Lab work: We will contact you to arrange labs (CBC, TSH, CMET, LIPIDS, VIT D) and a FLU SHOT  If you have labs (blood work) drawn today and your tests are completely normal, you will receive your results only by: Marland Kitchen MyChart Message (if you have MyChart) OR . A paper copy in the mail If you have any lab test that is abnormal or we need to change your treatment, we will call you to review the results.  Testing/Procedures: none  Follow-Up: At [facility], you and your health needs are our priority.  As part of our continuing mission to provide you with exceptional heart care, we have created designated Provider Care Teams.  These Care Teams include your primary Cardiologist (physician) and Advanced Practice Providers (APPs -  Physician Assistants and Nurse Practitioners) who all work together to provide you with the care you need, when you need it. You will need a follow up appointment in:  12 months.  Please call our office 2 months in advance to schedule this appointment.  You may see Dr. Dorris Carnes or one of the following Advanced Practice Providers on your designated Care Team: Richardson Dopp, PA-C [place], [place] . Daune Perch, NP  Any Other Special Instructions Will Be Listed Below (If Applicable).

## 2019-01-10 NOTE — Progress Notes (Signed)
Virtual Visit via Video Note   This visit type was conducted due to national recommendations for restrictions regarding the COVID-19 Pandemic (e.g. social distancing) in an effort to limit this patient's exposure and mitigate transmission in our community.  Due to her co-morbid illnesses, this patient is at least at moderate risk for complications without adequate follow up.  This format is felt to be most appropriate for this patient at this time.  All issues noted in this document were discussed and addressed.  A limited physical exam was performed with this format.  Please refer to the patient's chart for her consent to telehealth for [facility].   Date:  01/10/2019   ID:  Jennifer Donaldson, DOB [birth year], MRN [id number]  Patient Location: Home Provider Location: Home  PCP:  Dorothyann Peng, NP  Cardiologist:  No primary care provider on file.  Electrophysiologist:  None   Evaluation Performed:  Follow-Up Visit  Chief Complaint:    History of Present Illness:    Jennifer Donaldson is a 85 y.o. female with a hx of CAD     She is s/p PTCA/DES to the LAD and RCA    Nuclear scan in 2013 was normal    I saw her in clinic in Sept 2019  Since seen she denies CP   Breathing is OK    She is extremely stressed   Husband fell, broken bone   Now home  Manic   She is not sleeping well   Home health coming to assist  him   Very difficutl  The patient does not have symptoms concerning for COVID-19 infection (fever, chills, cough, or new shortness of breath).    Past Medical History:  Diagnosis Date  . C. difficile colitis   . CAD (coronary artery disease)   . Dyslipidemia 12/24/2006   Qualifier: Diagnosis of  By: Scherrie Gerlach    . Hyperlipidemia   . Mitral regurgitation   . MITRAL REGURGITATION 08/12/2008   Qualifier: Diagnosis of  By: Burnett Kanaris    . Osteopenia   . Osteoporosis 03/01/2014  . Raynaud's disease   . RAYNAUD'S DISEASE 12/24/2006   Qualifier: Diagnosis  of  By: Scherrie Gerlach    . Special screening for malignant neoplasm of colon    Past Surgical History:  Procedure Laterality Date  . BREAST EXCISIONAL BIOPSY Left   . CORONARY STENT PLACEMENT     2 prior drug eluting stent placed in the LAD and right coronary artery      No outpatient medications have been marked as taking for the 01/10/19 encounter (Telemedicine) with Fay Records, MD.     Allergies:   Codeine sulfate   Social History   Tobacco Use  . Smoking status: Former Smoker    Packs/day: 0.50    Years: 20.00    Pack years: 10.00    Types: Cigarettes    Quit date: 09/01/1983    Years since quitting: 35.3  . Smokeless tobacco: Never Used  Substance Use Topics  . Alcohol use: No    Alcohol/week: 0.0 standard drinks  . Drug use: No     Family Hx: The patient's family history includes CAD in her brother and mother; Stroke in her father. There is no history of Breast cancer.  ROS:   Please see the history of present illness.     All other systems reviewed and are negative.   Prior CV studies:   The following studies were reviewed today:  Labs/Other Tests and Data Reviewed:    EKG:  No ECG reviewed.  Recent Labs: No results found for requested labs within last 8760 hours.   Recent Lipid Panel Lab Results  Component Value Date/Time   CHOL 166 02/16/2018 08:01 AM   TRIG 67 02/16/2018 08:01 AM   HDL 97 02/16/2018 08:01 AM   CHOLHDL 1.7 02/16/2018 08:01 AM   CHOLHDL 2 11/05/2017 11:01 AM   LDLCALC 56 02/16/2018 08:01 AM   LDLDIRECT 124.7 07/20/2012 09:58 AM    Wt Readings from Last 3 Encounters:  01/10/19 95 lb (43.1 kg)  11/30/17 99 lb 6.4 oz (45.1 kg)  11/05/17 101 lb (45.8 kg)     Objective:    Vital Signs:  BP 138/70   Pulse 81   Wt 95 lb (43.1 kg)   BMI 19.86 kg/m    VITAL SIGNS:  reviewed  ASSESSMENT & PLAN:    1. CAD  No symptoms of angina  Very active    2  HTN  BP is good   Continue curernt meds   3  HL  WIll check lipids   Labs today:    4  COVID-19 Education: The signs and symptoms of COVID-19 were discussed with the patient and how to seek care for testing (follow up with PCP or arrange E-visit).  The importance of social distancing was discussed today.  Time:   Today, I have spent 20 minutes with the patient with telehealth technology discussing the above problems.     Medication Adjustments/Labs and Tests Ordered: Current medicines are reviewed at length with the patient today.  Concerns regarding medicines are outlined above.   Tests Ordered: No orders of the defined types were placed in this encounter.   Medication Changes: No orders of the defined types were placed in this encounter.   Follow Up:  In Person in 1 year Signed, Dorris Carnes, MD  01/10/2019 8:08 AM    [place]

## 2019-01-11 NOTE — Telephone Encounter (Signed)
Sent to the pharmacy by e-scribe for 90 days.  Pt has upcoming cpx on 01/13/2019.

## 2019-01-13 ENCOUNTER — Encounter: Payer: PPO | Admitting: Adult Health

## 2019-01-14 ENCOUNTER — Other Ambulatory Visit: Payer: Self-pay | Admitting: Adult Health

## 2019-01-14 NOTE — Telephone Encounter (Signed)
SENT TO THE PHARMACY BY E-SCRIBE FOR 90 DAYS. 

## 2019-01-17 ENCOUNTER — Other Ambulatory Visit (INDEPENDENT_AMBULATORY_CARE_PROVIDER_SITE_OTHER): Payer: PPO

## 2019-01-17 ENCOUNTER — Encounter: Payer: Self-pay | Admitting: *Deleted

## 2019-01-17 ENCOUNTER — Other Ambulatory Visit: Payer: Self-pay

## 2019-01-17 DIAGNOSIS — E782 Mixed hyperlipidemia: Secondary | ICD-10-CM

## 2019-01-17 DIAGNOSIS — I2510 Atherosclerotic heart disease of native coronary artery without angina pectoris: Secondary | ICD-10-CM

## 2019-01-17 DIAGNOSIS — I10 Essential (primary) hypertension: Secondary | ICD-10-CM

## 2019-01-17 DIAGNOSIS — Z23 Encounter for immunization: Secondary | ICD-10-CM

## 2019-01-17 NOTE — Progress Notes (Signed)
Patient here today for labs. Received flu vaccine as recommended by Dr. Harrington Challenger, right deltoid. Pt tolerated and was appreciative for this today.

## 2019-03-01 ENCOUNTER — Other Ambulatory Visit: Payer: Self-pay | Admitting: Internal Medicine

## 2019-03-09 ENCOUNTER — Other Ambulatory Visit: Payer: Self-pay

## 2019-03-29 ENCOUNTER — Other Ambulatory Visit: Payer: Self-pay

## 2019-03-30 ENCOUNTER — Encounter: Payer: PPO | Admitting: Adult Health

## 2019-04-18 DIAGNOSIS — L718 Other rosacea: Secondary | ICD-10-CM | POA: Diagnosis not present

## 2019-04-18 DIAGNOSIS — L738 Other specified follicular disorders: Secondary | ICD-10-CM | POA: Diagnosis not present

## 2019-04-18 DIAGNOSIS — L570 Actinic keratosis: Secondary | ICD-10-CM | POA: Diagnosis not present

## 2019-04-20 ENCOUNTER — Other Ambulatory Visit: Payer: Self-pay | Admitting: Adult Health

## 2019-04-20 DIAGNOSIS — N949 Unspecified condition associated with female genital organs and menstrual cycle: Secondary | ICD-10-CM | POA: Diagnosis not present

## 2019-04-20 DIAGNOSIS — R634 Abnormal weight loss: Secondary | ICD-10-CM | POA: Diagnosis not present

## 2019-04-20 DIAGNOSIS — Z01419 Encounter for gynecological examination (general) (routine) without abnormal findings: Secondary | ICD-10-CM | POA: Diagnosis not present

## 2019-04-20 DIAGNOSIS — M810 Age-related osteoporosis without current pathological fracture: Secondary | ICD-10-CM | POA: Diagnosis not present

## 2019-04-26 ENCOUNTER — Other Ambulatory Visit: Payer: Self-pay | Admitting: Obstetrics and Gynecology

## 2019-04-26 DIAGNOSIS — M810 Age-related osteoporosis without current pathological fracture: Secondary | ICD-10-CM

## 2019-05-02 DIAGNOSIS — N949 Unspecified condition associated with female genital organs and menstrual cycle: Secondary | ICD-10-CM | POA: Diagnosis not present

## 2019-05-02 DIAGNOSIS — M810 Age-related osteoporosis without current pathological fracture: Secondary | ICD-10-CM | POA: Diagnosis not present

## 2019-06-22 ENCOUNTER — Other Ambulatory Visit: Payer: Self-pay

## 2019-06-22 ENCOUNTER — Other Ambulatory Visit: Payer: Self-pay | Admitting: Internal Medicine

## 2019-06-22 ENCOUNTER — Ambulatory Visit (INDEPENDENT_AMBULATORY_CARE_PROVIDER_SITE_OTHER): Payer: PPO | Admitting: Adult Health

## 2019-06-22 ENCOUNTER — Encounter: Payer: Self-pay | Admitting: Adult Health

## 2019-06-22 VITALS — BP 130/80 | Temp 97.7°F | Wt 96.0 lb

## 2019-06-22 DIAGNOSIS — E785 Hyperlipidemia, unspecified: Secondary | ICD-10-CM

## 2019-06-22 DIAGNOSIS — M8949 Other hypertrophic osteoarthropathy, multiple sites: Secondary | ICD-10-CM

## 2019-06-22 DIAGNOSIS — I2510 Atherosclerotic heart disease of native coronary artery without angina pectoris: Secondary | ICD-10-CM | POA: Diagnosis not present

## 2019-06-22 DIAGNOSIS — I2583 Coronary atherosclerosis due to lipid rich plaque: Secondary | ICD-10-CM

## 2019-06-22 DIAGNOSIS — Z0000 Encounter for general adult medical examination without abnormal findings: Secondary | ICD-10-CM | POA: Diagnosis not present

## 2019-06-22 DIAGNOSIS — M810 Age-related osteoporosis without current pathological fracture: Secondary | ICD-10-CM | POA: Diagnosis not present

## 2019-06-22 DIAGNOSIS — M159 Polyosteoarthritis, unspecified: Secondary | ICD-10-CM

## 2019-06-22 LAB — COMPREHENSIVE METABOLIC PANEL
ALT: 15 U/L (ref 0–35)
AST: 26 U/L (ref 0–37)
Albumin: 4.1 g/dL (ref 3.5–5.2)
Alkaline Phosphatase: 58 U/L (ref 39–117)
BUN: 16 mg/dL (ref 6–23)
CO2: 30 mEq/L (ref 19–32)
Calcium: 9.6 mg/dL (ref 8.4–10.5)
Chloride: 101 mEq/L (ref 96–112)
Creatinine, Ser: 0.79 mg/dL (ref 0.40–1.20)
GFR: 69.17 mL/min (ref >60.00)
Glucose, Bld: 91 mg/dL (ref 70–99)
Potassium: 4.2 mEq/L (ref 3.5–5.1)
Sodium: 137 mEq/L (ref 135–145)
Total Bilirubin: 0.6 mg/dL (ref 0.2–1.2)
Total Protein: 7.1 g/dL (ref 6.0–8.3)

## 2019-06-22 LAB — CBC WITH DIFFERENTIAL/PLATELET
Basophils Absolute: 0.1 10*3/uL (ref 0.0–0.1)
Basophils Relative: 1.7 % (ref 0.0–3.0)
Eosinophils Absolute: 0.2 10*3/uL (ref 0.0–0.7)
Eosinophils Relative: 2 % (ref 0.0–5.0)
HCT: 36.6 % (ref 36.0–46.0)
Hemoglobin: 12.2 g/dL (ref 12.0–15.0)
Lymphocytes Relative: 33.4 % (ref 12.0–46.0)
Lymphs Abs: 2.5 10*3/uL (ref 0.7–4.0)
MCHC: 33.5 g/dL (ref 30.0–36.0)
MCV: 91.5 fl (ref 78.0–100.0)
Monocytes Absolute: 0.7 10*3/uL (ref 0.1–1.0)
Monocytes Relative: 9.1 % (ref 3.0–12.0)
Neutro Abs: 4 10*3/uL (ref 1.4–7.7)
Neutrophils Relative %: 53.8 % (ref 43.0–77.0)
Platelets: 395 10*3/uL (ref 150.0–400.0)
RBC: 4 Mil/uL (ref 3.87–5.11)
RDW: 13.7 % (ref 11.5–15.5)
WBC: 7.4 10*3/uL (ref 4.0–10.5)

## 2019-06-22 LAB — TSH: TSH: 1.92 u[IU]/mL (ref 0.35–4.50)

## 2019-06-22 LAB — LIPID PANEL
Cholesterol: 171 mg/dL (ref 0–200)
HDL: 84 mg/dL (ref >39.00)
LDL Cholesterol: 75 mg/dL (ref 0–99)
NonHDL: 86.95
Total CHOL/HDL Ratio: 2
Triglycerides: 61 mg/dL (ref 0.0–149.0)
VLDL: 12.2 mg/dL (ref 0.0–40.0)

## 2019-06-22 LAB — VITAMIN D 25 HYDROXY (VIT D DEFICIENCY, FRACTURES): VITD: 49.9 ng/mL (ref 30.00–100.00)

## 2019-06-22 MED ORDER — MELOXICAM 15 MG PO TABS: ORAL_TABLET | ORAL | 1 refills | Status: DC

## 2019-06-22 NOTE — Progress Notes (Signed)
Subjective:    Patient ID: Jennifer Donaldson, female    DOB: 1932/07/01, 86 y.o.   MRN: [id number]  HPI Patient presents for yearly preventative medicine examination. She is a pleasant and remarkable 86 year old female who  has a past medical history of C. difficile colitis, CAD (coronary artery disease), Dyslipidemia (12/24/2006), Hyperlipidemia, Mitral regurgitation, MITRAL REGURGITATION (08/12/2008), Osteopenia, Osteoporosis (03/01/2014), Raynaud's disease, RAYNAUD'S DISEASE (12/24/2006), and Special screening for malignant neoplasm of colon.  She is the main caregiver for her elderly husband.   Essential Hypertension - Controlled with Norvasc 2.5 mg daily. She denies dizziness, lightheadedness, chest pain, or SOB.  BP Readings from Last 3 Encounters:  06/22/19 130/80  01/10/19 138/70  11/30/17 (!) 148/72    Hyperlipidemia/CAD - s/p PCI/DES to LAD and RCA. She is currently prescribed Lipitor 20 mg, Zetia 10 mg  and ASA  81 mg . She denies myalgias, fatigue, CP, or SOB Lab Results  Component Value Date   CHOL 166 02/16/2018   HDL 97 02/16/2018   LDLCALC 56 02/16/2018   LDLDIRECT 124.7 07/20/2012   TRIG 67 02/16/2018   CHOLHDL 1.7 02/16/2018   Osteoporosis - prescribed Fosamax 70 mg weekly by GYN. She is also on vitamin D supplement.   Osteoarthritis - takes Mobic PRN   All immunizations and health maintenance protocols were reviewed with the patient and needed orders were placed. She is up to date on routine vaccinations.   Appropriate screening laboratory values were ordered for the patient including screening of hyperlipidemia, renal function and hepatic function.  Medication reconciliation,  past medical history, social history, problem list and allergies were reviewed in detail with the patient  Goals were established with regard to weight loss, exercise, and  diet in compliance with medications  Wt Readings from Last 3 Encounters:  06/22/19 96 lb (43.5 kg)  01/10/19  95 lb (43.1 kg)  11/30/17 99 lb 6.4 oz (45.1 kg)   End of life planning was discussed. She has an advanced directive and living will   She denies any acute complaints today. Is up to date on mammogram.   Review of Systems  Constitutional: Negative.   HENT: Negative.   Eyes: Negative.   Respiratory: Negative.   Cardiovascular: Negative.   Gastrointestinal: Negative.   Endocrine: Negative.   Genitourinary: Negative.   Musculoskeletal: Positive for arthralgias.  Skin: Negative.   Allergic/Immunologic: Negative.   Neurological: Negative.   Hematological: Negative.   Psychiatric/Behavioral: Negative.    Past Medical History:  Diagnosis Date  . C. difficile colitis   . CAD (coronary artery disease)   . Dyslipidemia 12/24/2006   Qualifier: Diagnosis of  By: Scherrie Gerlach    . Hyperlipidemia   . Mitral regurgitation   . MITRAL REGURGITATION 08/12/2008   Qualifier: Diagnosis of  By: Burnett Kanaris    . Osteopenia   . Osteoporosis 03/01/2014  . Raynaud's disease   . RAYNAUD'S DISEASE 12/24/2006   Qualifier: Diagnosis of  By: Scherrie Gerlach    . Special screening for malignant neoplasm of colon     Social History   Socioeconomic History  . Marital status: Married    Spouse name: Not on file  . Number of children: Not on file  . Years of education: Not on file  . Highest education level: Not on file  Occupational History  . Not on file  Tobacco Use  . Smoking status: Former Smoker    Packs/day: 0.50    Years: 20.00  Pack years: 10.00    Types: Cigarettes    Quit date: 09/01/1983    Years since quitting: 35.8  . Smokeless tobacco: Never Used  Substance and Sexual Activity  . Alcohol use: No    Alcohol/week: 0.0 standard drinks  . Drug use: No  . Sexual activity: Not on file  Other Topics Concern  . Not on file  Social History Narrative   Retired Engineer, structural   Married for 49 years - husband has multiple medical issues and is disabled   4  children   Grew up in [place] Strain:   . Difficulty of Paying Living Expenses: Not on file  Food Insecurity:   . Worried About Charity fundraiser in the Last Year: Not on file  . Ran Out of Food in the Last Year: Not on file  Transportation Needs:   . Lack of Transportation (Medical): Not on file  . Lack of Transportation (Non-Medical): Not on file  Physical Activity:   . Days of Exercise per Week: Not on file  . Minutes of Exercise per Session: Not on file  Stress:   . Feeling of Stress : Not on file  Social Connections:   . Frequency of Communication with Friends and Family: Not on file  . Frequency of Social Gatherings with Friends and Family: Not on file  . Attends Religious Services: Not on file  . Active Member of Clubs or Organizations: Not on file  . Attends Archivist Meetings: Not on file  . Marital Status: Not on file  Intimate Partner Violence:   . Fear of Current or Ex-Partner: Not on file  . Emotionally Abused: Not on file  . Physically Abused: Not on file  . Sexually Abused: Not on file    Past Surgical History:  Procedure Laterality Date  . BREAST EXCISIONAL BIOPSY Left   . CORONARY STENT PLACEMENT     2 prior drug eluting stent placed in the LAD and right coronary artery     Family History  Problem Relation Age of Onset  . CAD Brother   . Stroke Father   . CAD Mother   . Breast cancer Neg Hx     Allergies  Allergen Reactions  . Codeine Sulfate     REACTION: sensitivity to    Current Outpatient Medications on File Prior to Visit  Medication Sig Dispense Refill  . alendronate (FOSAMAX) 70 MG tablet Take 70 mg by mouth once a week.  11  . amLODipine (NORVASC) 2.5 MG tablet TAKE 1 TABLET (2.5 MG TOTAL) BY MOUTH DAILY. 90 tablet 0  . aspirin 81 MG tablet Take 81 mg by mouth daily.      Marland Kitchen atorvastatin (LIPITOR) 20 MG tablet TAKE 1 TABLET (20 MG TOTAL) BY MOUTH DAILY AT 6 PM. 90  tablet 2  . calcium citrate-vitamin D (CITRACAL+D) 315-200 MG-UNIT per tablet Take 2 tablets by mouth daily.     . chlorhexidine (PERIDEX) 0.12 % solution Use as directed 5 mLs in the mouth or throat at bedtime as needed (when patient remembers).     . Cholecalciferol (VITAMIN D3) 1000 UNITS CAPS Take 1 tablet by mouth daily.      . cyclobenzaprine (FLEXERIL) 10 MG tablet TAKE 1 TABLET BY MOUTH THREE TIMES A DAY AS NEEDED FOR MUSCLE SPASMS 60 tablet 5  . ezetimibe (ZETIA) 10 MG tablet TAKE 1 TABLET BY MOUTH EVERY DAY 90  tablet 2  . meloxicam (MOBIC) 15 MG tablet TAKE 1 (ONE) TABLET DAILY AS NEEDED FOR PAIN 90 tablet 0   No current facility-administered medications on file prior to visit.    BP 130/80   Temp 97.7 F (36.5 C) (Temporal)   Wt 96 lb (43.5 kg)   BMI 20.06 kg/m       Objective:   Physical Exam Vitals and nursing note reviewed.  Constitutional:      General: She is not in acute distress.    Appearance: Normal appearance. She is well-developed. She is not ill-appearing.  HENT:     Head: Normocephalic and atraumatic.     Right Ear: Tympanic membrane, ear canal and external ear normal. There is no impacted cerumen.     Left Ear: Tympanic membrane, ear canal and external ear normal. There is no impacted cerumen.     Nose: Nose normal. No congestion or rhinorrhea.     Mouth/Throat:     Mouth: Mucous membranes are moist.     Pharynx: Oropharynx is clear. No oropharyngeal exudate or posterior oropharyngeal erythema.  Eyes:     General:        Right eye: No discharge.        Left eye: No discharge.     Extraocular Movements: Extraocular movements intact.     Conjunctiva/sclera: Conjunctivae normal.     Pupils: Pupils are equal, round, and reactive to light.  Neck:     Thyroid: No thyromegaly.     Vascular: No carotid bruit.     Trachea: No tracheal deviation.  Cardiovascular:     Rate and Rhythm: Normal rate and regular rhythm.     Pulses: Normal pulses.     Heart  sounds: Normal heart sounds. No murmur. No friction rub. No gallop.   Pulmonary:     Effort: Pulmonary effort is normal. No respiratory distress.     Breath sounds: Normal breath sounds. No stridor. No wheezing, rhonchi or rales.  Chest:     Chest wall: No tenderness.  Abdominal:     General: Abdomen is flat. Bowel sounds are normal. There is no distension.     Palpations: Abdomen is soft. There is no mass.     Tenderness: There is no abdominal tenderness. There is no right CVA tenderness, left CVA tenderness, guarding or rebound.     Hernia: No hernia is present.  Musculoskeletal:        General: No swelling, tenderness, deformity or signs of injury. Normal range of motion.     Cervical back: Normal range of motion and neck supple.     Right lower leg: No edema.     Left lower leg: No edema.  Lymphadenopathy:     Cervical: No cervical adenopathy.  Skin:    General: Skin is warm and dry.     Coloration: Skin is not jaundiced or pale.     Findings: No bruising, erythema, lesion or rash.  Neurological:     General: No focal deficit present.     Mental Status: She is alert and oriented to person, place, and time.     Cranial Nerves: No cranial nerve deficit.     Sensory: No sensory deficit.     Motor: No weakness.     Coordination: Coordination normal.     Gait: Gait normal.     Deep Tendon Reflexes: Reflexes normal.  Psychiatric:        Mood and Affect: Mood normal.  Behavior: Behavior normal.        Thought Content: Thought content normal.        Judgment: Judgment normal.       Assessment & Plan:  1. Routine general medical examination at a health care facility - Remarkable 86 year old female.  - Continue to stay active and eat healthy  - Follow up in one year or sooner if needed - CBC with Differential/Platelet - Comprehensive metabolic panel - Lipid panel - TSH  2. Dyslipidemia - continue with stain and zetia  - CBC with Differential/Platelet -  Comprehensive metabolic panel - Lipid panel - TSH  3. Coronary artery disease due to lipid rich plaque - Continue with Cardiology plan of care - CBC with Differential/Platelet - Comprehensive metabolic panel - Lipid panel - TSH  4. Age-related osteoporosis without current pathological fracture - Continue with Fosamax - Vitamin D, 25-hydroxy  5. Primary osteoarthritis involving multiple joints  - meloxicam (MOBIC) 15 MG tablet; TAKE 1 (ONE) TABLET DAILY AS NEEDED FOR PAIN  Dispense: 90 tablet; Refill: 1 - CBC with Differential/Platelet - Comprehensive metabolic panel - Lipid panel - TSH   Dorothyann Peng, NP

## 2019-06-22 NOTE — Telephone Encounter (Signed)
*  STAT* If patient is at the pharmacy, call can be transferred to refill team. ? ? ?1. Which medications need to be refilled? (please list name of each medication and dose if known)  ?atorvastatin (LIPITOR) 20 MG tablet ? ?2. Which pharmacy/location (including street and city if local pharmacy) is medication to be sent to? ?[id number] - , [place] - [address] ? ?3. Do they need a 30 day or 90 day supply?  ?90 day supply ? ?

## 2019-06-27 ENCOUNTER — Telehealth: Payer: Self-pay | Admitting: Adult Health

## 2019-06-27 NOTE — Chronic Care Management (AMB) (Signed)
  Chronic Care Management   Note  06/27/2019 Name: Jennifer Donaldson MRN: [id number] DOB: 1932-12-15  Jennifer Donaldson is a 86 y.o. year old female who is a primary care patient of Dorothyann Peng, NP. I reached out to Harl Favor by phone today in response to a referral sent by Ms. [place] PCP, Dorothyann Peng, NP.   [name] was given information about Chronic Care Management services today including:  1. CCM service includes personalized support from designated clinical staff supervised by her physician, including individualized plan of care and coordination with other care providers 2. 24/7 contact phone numbers for assistance for urgent and routine care needs. 3. Service will only be billed when office clinical staff spend 20 minutes or more in a month to coordinate care. 4. Only one practitioner may furnish and bill the service in a calendar month. 5. The patient may stop CCM services at any time (effective at the end of the month) by phone call to the office staff.   Patient agreed to services and verbal consent obtained.   Follow up plan:   Raynicia Dukes [employer] Scheduler

## 2019-07-01 ENCOUNTER — Telehealth: Payer: Self-pay | Admitting: Internal Medicine

## 2019-07-01 MED ORDER — ATORVASTATIN CALCIUM 20 MG PO TABS: 20.0000 mg | ORAL_TABLET | Freq: Every day | ORAL | 1 refills | Status: DC

## 2019-07-01 NOTE — Telephone Encounter (Signed)
*  STAT* If patient is at the pharmacy, call can be transferred to refill team. ? ? ?1. Which medications need to be refilled? (please list name of each medication and dose if known)  ?atorvastatin (LIPITOR) 20 MG tablet ? ?2. Which pharmacy/location (including street and city if local pharmacy) is medication to be sent to? ?[id number] - [place], [place] - [address] ? ?3. Do they need a 30 day or 90 day supply?  ?90 day supply ? ?

## 2019-07-01 NOTE — Telephone Encounter (Signed)
Pt's medication was sent to pt's pharmacy as requested. Confirmation received.  °

## 2019-07-11 ENCOUNTER — Telehealth: Payer: PPO

## 2019-08-16 ENCOUNTER — Ambulatory Visit
Admission: RE | Admit: 2019-08-16 | Discharge: 2019-08-16 | Disposition: A | Discharge status: Home / Self Care | Payer: PPO | Source: Ambulatory Visit | Attending: Obstetrics and Gynecology | Admitting: Obstetrics and Gynecology

## 2019-08-16 ENCOUNTER — Other Ambulatory Visit: Payer: Self-pay

## 2019-08-16 DIAGNOSIS — M810 Age-related osteoporosis without current pathological fracture: Secondary | ICD-10-CM | POA: Diagnosis not present

## 2019-08-16 DIAGNOSIS — M85852 Other specified disorders of bone density and structure, left thigh: Secondary | ICD-10-CM | POA: Diagnosis not present

## 2019-08-16 DIAGNOSIS — Z780 Asymptomatic menopausal state: Secondary | ICD-10-CM | POA: Diagnosis not present

## 2019-08-23 ENCOUNTER — Telehealth: Payer: PPO

## 2019-09-22 ENCOUNTER — Telehealth: Payer: Self-pay | Admitting: *Deleted

## 2019-09-22 NOTE — Telephone Encounter (Signed)
Message from patient calling to schedule hers and her husbands appointments w Dr. Harrington Challenger.  She was not due to be seen until Sept but requests sooner due feeling some discomfort/pains in chest. Blames this on being under extreme stress taking care of her husband.  She is very active caring for him at home and taking him to appointments.  There is daily aid/attendents to assist.  I have scheduled her w Dr. Harrington Challenger on 10/04/19 for further evaluation.

## 2019-10-04 ENCOUNTER — Other Ambulatory Visit: Payer: Self-pay

## 2019-10-04 ENCOUNTER — Ambulatory Visit: Payer: PPO | Admitting: Internal Medicine

## 2019-10-04 ENCOUNTER — Encounter: Payer: Self-pay | Admitting: Internal Medicine

## 2019-10-04 ENCOUNTER — Encounter: Payer: Self-pay | Admitting: *Deleted

## 2019-10-04 VITALS — BP 144/68 | HR 75 | Ht <= 58 in | Wt 95.4 lb

## 2019-10-04 DIAGNOSIS — I25119 Atherosclerotic heart disease of native coronary artery with unspecified angina pectoris: Secondary | ICD-10-CM | POA: Diagnosis not present

## 2019-10-04 DIAGNOSIS — R079 Chest pain, unspecified: Secondary | ICD-10-CM

## 2019-10-04 NOTE — Progress Notes (Signed)
Cardiology Office Note   Date:  10/04/2019   ID:  [name], DOB 06/29/1932, MRN [id number]  PCP:  Dorothyann Peng, NP  Cardiologist:   Dorris Carnes, MD   [place] presents for f/u of CAD     History of Present Illness: Jennifer Donaldson is a 86 y.o. female with a history of CAD  She wa previously seen by T Stuckey  Pt is s/p PCI/DES to LAD and RCA  Nuclear scan in 2013 was normal   I last saw the pt in Sept 2020 as a televisit  The pt says she remains under increased stress caring for husband. SHe does get some chest pressure that comes and goes.   Difficult to describe   Attrib to stress but worries about heart COmplains of fatigue She denieso CP   No dizziness  No SOB   No palpijtations    Last lipids in march 2021 LDL 75  HDL 84  Trig 61       Outpatient Medications Prior to Visit  Medication Sig Dispense Refill  . alendronate (FOSAMAX) 70 MG tablet Take 70 mg by mouth once a week.  11  . amLODipine (NORVASC) 2.5 MG tablet TAKE 1 TABLET (2.5 MG TOTAL) BY MOUTH DAILY. 90 tablet 0  . aspirin 81 MG tablet Take 81 mg by mouth daily.      Marland Kitchen atorvastatin (LIPITOR) 20 MG tablet Take 1 tablet (20 mg total) by mouth daily at 6 PM. 90 tablet 1  . calcium citrate-vitamin D (CITRACAL+D) 315-200 MG-UNIT per tablet Take 2 tablets by mouth daily.     . chlorhexidine (PERIDEX) 0.12 % solution Use as directed 5 mLs in the mouth or throat at bedtime as needed (when patient remembers).     . Cholecalciferol (VITAMIN D3) 1000 UNITS CAPS Take 1 tablet by mouth daily.      . cyclobenzaprine (FLEXERIL) 10 MG tablet TAKE 1 TABLET BY MOUTH THREE TIMES A DAY AS NEEDED FOR MUSCLE SPASMS 60 tablet 5  . meloxicam (MOBIC) 15 MG tablet TAKE 1 (ONE) TABLET DAILY AS NEEDED FOR PAIN 90 tablet 1  . ezetimibe (ZETIA) 10 MG tablet TAKE 1 TABLET BY MOUTH EVERY DAY (Patient not taking: Reported on 10/04/2019) 90 tablet 2   No facility-administered medications prior to visit.     Allergies:    Codeine sulfate   Past Medical History:  Diagnosis Date  . C. difficile colitis   . CAD (coronary artery disease)   . Dyslipidemia 12/24/2006   Qualifier: Diagnosis of  By: Scherrie Gerlach    . Hyperlipidemia   . Mitral regurgitation   . MITRAL REGURGITATION 08/12/2008   Qualifier: Diagnosis of  By: Burnett Kanaris    . Osteopenia   . Osteoporosis 03/01/2014  . Raynaud's disease   . RAYNAUD'S DISEASE 12/24/2006   Qualifier: Diagnosis of  By: Scherrie Gerlach    . Special screening for malignant neoplasm of colon     Past Surgical History:  Procedure Laterality Date  . BREAST EXCISIONAL BIOPSY Left   . CORONARY STENT PLACEMENT     2 prior drug eluting stent placed in the LAD and right coronary artery      Social History:  The patient  reports that she quit smoking about 36 years ago. Her smoking use included cigarettes. She has a 10.00 pack-year smoking history. She has never used smokeless tobacco. She reports that she does not drink alcohol and does not use drugs.  Family History:  The patient's family history includes CAD in her brother and mother; Stroke in her father.    ROS:  Please see the history of present illness. All other systems are reviewed and  Negative to the above problem except as noted.    PHYSICAL EXAM: VS:  BP (!) 144/68   Pulse 75   Ht 4\' 10"  (1.473 m)   Wt 95 lb 6.4 oz (43.3 kg)   SpO2 98%   BMI 19.94 kg/m   GEN  Thin 86 yo n no acute distress  HEENT: normal  Neck: JVP is normal   No carotid bruits Cardiac: RRR; no murmurs, rubs, or gallops,noLE  edema  Respiratory:  clear to auscultation bilaterally, normal work of breathing GI: soft, nontender, nondistended, + BS  No hepatomegaly  MS: no deformity Moving all extremities   Skin: warm and dry, no rash Neuro:  Strength and sensation are intact Psych: euthymic mood, full affect    EKG:  EKG is ordered today.SR 75 bpm    Lipid Panel    Component Value Date/Time   CHOL 171 06/22/2019 1431    CHOL 166 02/16/2018 0801   TRIG 61.0 06/22/2019 1431   HDL 84.00 06/22/2019 1431   HDL 97 02/16/2018 0801   CHOLHDL 2 06/22/2019 1431   VLDL 12.2 06/22/2019 1431   LDLCALC 75 06/22/2019 1431   LDLCALC 56 02/16/2018 0801   LDLDIRECT 124.7 07/20/2012 0958      Wt Readings from Last 3 Encounters:  10/04/19 95 lb 6.4 oz (43.3 kg)  06/22/19 96 lb (43.5 kg)  01/10/19 95 lb (43.1 kg)      ASSESSMENT AND PLAN:  1  CAD Pt with intermitt discomfort  Unclear what this represents   But with hx of CAD would investigate    Will set up for a lexiscan myovue  Contine current meds   2.  HTN  BP is controlled   3. HL  Last LDL in march was 75   HDL was   84   WIll follow    F/U in  Clinic next winter   Signed, Dorris Carnes, MD  10/04/2019 12:24 PM    [facility] [place], [place], [place]  [address] Phone: [phone]; Fax: [phone]

## 2019-10-04 NOTE — Patient Instructions (Signed)
Medication Instructions:  No changes *If you need a refill on your cardiac medications before your next appointment, please call your pharmacy*   Lab Work: none If you have labs (blood work) drawn today and your tests are completely normal, you will receive your results only by:  [place] (if you have MyChart) OR  A paper copy in the mail If you have any lab test that is abnormal or we need to change your treatment, we will call you to review the results.   Testing/Procedures: Your physician has requested that you have a lexiscan myoview. For further information please visit [email or web address]. Please follow instruction sheet, as given.   Follow-Up: At [facility], you and your health needs are our priority.  As part of our continuing mission to provide you with exceptional heart care, we have created designated Provider Care Teams.  These Care Teams include your primary Cardiologist (physician) and Advanced Practice Providers (APPs -  Physician Assistants and Nurse Practitioners) who all work together to provide you with the care you need, when you need it.  We recommend signing up for the patient portal called "MyChart".  Sign up information is provided on this After Visit Summary.  MyChart is used to connect with patients for Virtual Visits (Telemedicine).  Patients are able to view lab/test results, encounter notes, upcoming appointments, etc.  Non-urgent messages can be sent to your provider as well.   To learn more about what you can do with MyChart, go to [email or web address].    Your next appointment:   8 month(s)  The format for your next appointment:   In Person  Provider:   You may see Dorris Carnes, MD or one of the following Advanced Practice Providers on your designated Care Team:    Richardson Dopp, PA-C  Robbie Lis, [place]    Other Instructions

## 2019-10-18 ENCOUNTER — Telehealth (HOSPITAL_COMMUNITY): Payer: Self-pay | Admitting: *Deleted

## 2019-10-18 NOTE — Telephone Encounter (Signed)
Patient given detailed instructions per Myocardial Perfusion Study Information Sheet for the test on 10/20/19 at 10:45. Patient notified to arrive 15 minutes early and that it is imperative to arrive on time for appointment to keep from having the test rescheduled.  If you need to cancel or reschedule your appointment, please call the office within 24 hours of your appointment. . Patient verbalized understanding.Jennifer Donaldson

## 2019-10-20 ENCOUNTER — Ambulatory Visit (HOSPITAL_COMMUNITY): Payer: PPO | Attending: Cardiovascular Disease

## 2019-10-20 ENCOUNTER — Other Ambulatory Visit: Payer: Self-pay

## 2019-10-20 VITALS — Ht <= 58 in | Wt 95.0 lb

## 2019-10-20 DIAGNOSIS — R110 Nausea: Secondary | ICD-10-CM

## 2019-10-20 DIAGNOSIS — R079 Chest pain, unspecified: Secondary | ICD-10-CM | POA: Diagnosis not present

## 2019-10-20 DIAGNOSIS — I25119 Atherosclerotic heart disease of native coronary artery with unspecified angina pectoris: Secondary | ICD-10-CM | POA: Insufficient documentation

## 2019-10-20 LAB — MYOCARDIAL PERFUSION IMAGING
LV dias vol: 31 mL (ref 46–106)
LV sys vol: 7 mL
Peak HR: 103 {beats}/min
Rest HR: 72 {beats}/min
SDS: 5
SRS: 2
SSS: 7
TID: 0.89

## 2019-10-20 MED ORDER — REGADENOSON 0.4 MG/5ML IV SOLN
0.4000 mg | Freq: Once | INTRAVENOUS | Status: AC
  Administered 2019-10-20: 0.4 mg via INTRAVENOUS

## 2019-10-20 MED ORDER — TECHNETIUM TC 99M TETROFOSMIN IV KIT
32.4000 | PACK | Freq: Once | INTRAVENOUS | Status: AC | PRN
  Administered 2019-10-20: 32.4 via INTRAVENOUS
  Filled 2019-10-20: qty 33

## 2019-10-20 MED ORDER — TECHNETIUM TC 99M TETROFOSMIN IV KIT
10.2000 | PACK | Freq: Once | INTRAVENOUS | Status: AC | PRN
  Administered 2019-10-20: 10.2 via INTRAVENOUS
  Filled 2019-10-20: qty 11

## 2019-10-20 MED ORDER — AMINOPHYLLINE 25 MG/ML IV SOLN
75.0000 mg | Freq: Once | INTRAVENOUS | Status: AC
  Administered 2019-10-20: 75 mg via INTRAVENOUS

## 2019-11-01 ENCOUNTER — Other Ambulatory Visit: Payer: Self-pay

## 2019-11-01 ENCOUNTER — Ambulatory Visit (INDEPENDENT_AMBULATORY_CARE_PROVIDER_SITE_OTHER): Payer: PPO | Admitting: Adult Health

## 2019-11-01 ENCOUNTER — Encounter: Payer: Self-pay | Admitting: Adult Health

## 2019-11-01 VITALS — BP 136/70 | Temp 97.9°F | Wt 92.0 lb

## 2019-11-01 DIAGNOSIS — K2100 Gastro-esophageal reflux disease with esophagitis, without bleeding: Secondary | ICD-10-CM | POA: Diagnosis not present

## 2019-11-01 DIAGNOSIS — Z638 Other specified problems related to primary support group: Secondary | ICD-10-CM | POA: Diagnosis not present

## 2019-11-01 MED ORDER — CITALOPRAM HYDROBROMIDE 10 MG PO TABS: 10.0000 mg | ORAL_TABLET | Freq: Every day | ORAL | 1 refills | Status: DC

## 2019-11-01 MED ORDER — OMEPRAZOLE 20 MG PO CPDR: 20.0000 mg | DELAYED_RELEASE_CAPSULE | Freq: Every day | ORAL | 1 refills | Status: DC

## 2019-11-01 NOTE — Patient Instructions (Addendum)
I am going to put you on amedication called Celexa to help with your nerves  I am also going to prescribe you omeprazole for acid reflux   Please follow up in 1 month for reevaluation

## 2019-11-01 NOTE — Progress Notes (Signed)
Subjective:    Patient ID: Jennifer Donaldson, female    DOB: 12/07/1932, 86 y.o.   MRN: [id number]  HPI   86 year old female who is presenting to the office today for concern of abdominal pain.  She has had abdominal pain on and off for the last 2 months.  Pain is worse at night after she lays down.  She has not had any nausea or vomiting, diarrhea, or constipation.  Pain can radiate up into her esophagus.  She reports that she has a lot of stress in caring for her elderly husband at home, taking him to his medical appointments, and helping him with his ADLs.  She often goes to bed around 10:00 and wakes up around 6.  She does feel stressed out and anxious at times and believes that this is contributing to her abdominal pain  Her appetite is good and she is eating throughout the day.  She tries to eat healthy but this is not always an option.  Review of Systems See HPI   Past Medical History:  Diagnosis Date  . C. difficile colitis   . CAD (coronary artery disease)   . Dyslipidemia 12/24/2006   Qualifier: Diagnosis of  By: Scherrie Gerlach    . Hyperlipidemia   . Mitral regurgitation   . MITRAL REGURGITATION 08/12/2008   Qualifier: Diagnosis of  By: Burnett Kanaris    . Osteopenia   . Osteoporosis 03/01/2014  . Raynaud's disease   . RAYNAUD'S DISEASE 12/24/2006   Qualifier: Diagnosis of  By: Scherrie Gerlach    . Special screening for malignant neoplasm of colon     Social History   Socioeconomic History  . Marital status: Married    Spouse name: Not on file  . Number of children: Not on file  . Years of education: Not on file  . Highest education level: Not on file  Occupational History  . Not on file  Tobacco Use  . Smoking status: Former Smoker    Packs/day: 0.50    Years: 20.00    Pack years: 10.00    Types: Cigarettes    Quit date: 09/01/1983    Years since quitting: 36.1  . Smokeless tobacco: Never Used  Vaping Use  . Vaping Use: Never used  Substance and  Sexual Activity  . Alcohol use: No    Alcohol/week: 0.0 standard drinks  . Drug use: No  . Sexual activity: Not on file  Other Topics Concern  . Not on file  Social History Narrative   Retired Engineer, structural   Married for 64 years - husband has multiple medical issues and is disabled   4 children   Grew up in [place] Strain:   . Difficulty of Paying Living Expenses:   Food Insecurity:   . Worried About Charity fundraiser in the Last Year:   . Arboriculturist in the Last Year:   Transportation Needs:   . Film/video editor (Medical):   Marland Kitchen Lack of Transportation (Non-Medical):   Physical Activity:   . Days of Exercise per Week:   . Minutes of Exercise per Session:   Stress:   . Feeling of Stress :   Social Connections:   . Frequency of Communication with Friends and Family:   . Frequency of Social Gatherings with Friends and Family:   . Attends Religious Services:   . Active Member of Clubs  or Organizations:   . Attends Archivist Meetings:   Marland Kitchen Marital Status:   Intimate Partner Violence:   . Fear of Current or Ex-Partner:   . Emotionally Abused:   Marland Kitchen Physically Abused:   . Sexually Abused:     Past Surgical History:  Procedure Laterality Date  . BREAST EXCISIONAL BIOPSY Left   . CORONARY STENT PLACEMENT     2 prior drug eluting stent placed in the LAD and right coronary artery     Family History  Problem Relation Age of Onset  . CAD Brother   . Stroke Father   . CAD Mother   . Breast cancer Neg Hx     Allergies  Allergen Reactions  . Codeine Sulfate     REACTION: sensitivity to    Current Outpatient Medications on File Prior to Visit  Medication Sig Dispense Refill  . alendronate (FOSAMAX) 70 MG tablet Take 70 mg by mouth once a week.  11  . amLODipine (NORVASC) 2.5 MG tablet TAKE 1 TABLET (2.5 MG TOTAL) BY MOUTH DAILY. 90 tablet 0  . aspirin 81 MG tablet Take 81 mg by  mouth daily.      Marland Kitchen atorvastatin (LIPITOR) 20 MG tablet Take 1 tablet (20 mg total) by mouth daily at 6 PM. 90 tablet 1  . calcium citrate-vitamin D (CITRACAL+D) 315-200 MG-UNIT per tablet Take 2 tablets by mouth daily.     . chlorhexidine (PERIDEX) 0.12 % solution Use as directed 5 mLs in the mouth or throat at bedtime as needed (when patient remembers).     . Cholecalciferol (VITAMIN D3) 1000 UNITS CAPS Take 1 tablet by mouth daily.      . cyclobenzaprine (FLEXERIL) 10 MG tablet TAKE 1 TABLET BY MOUTH THREE TIMES A DAY AS NEEDED FOR MUSCLE SPASMS 60 tablet 5  . doxycycline (VIBRAMYCIN) 50 MG capsule Take 50 mg oral daily as needed for Rosacea    . ezetimibe (ZETIA) 10 MG tablet Take 10 mg by mouth daily.    . meloxicam (MOBIC) 15 MG tablet TAKE 1 (ONE) TABLET DAILY AS NEEDED FOR PAIN 90 tablet 1   No current facility-administered medications on file prior to visit.    BP 136/70   Temp 97.9 F (36.6 C)   Wt 92 lb (41.7 kg)   BMI 19.23 kg/m       Objective:   Physical Exam Vitals and nursing note reviewed.  Constitutional:      Appearance: She is well-developed.  Abdominal:     General: Bowel sounds are normal.     Palpations: Abdomen is rigid.     Tenderness: There is abdominal tenderness (mild epigastric ) in the epigastric area. There is no right CVA tenderness, left CVA tenderness or rebound. Negative signs include Murphy's sign, Rovsing's sign, McBurney's sign and psoas sign.     Hernia: No hernia is present.  Genitourinary:    Adnexa: Right adnexa normal and left adnexa normal.     Rectum: Normal.  Skin:    General: Skin is warm and dry.  Neurological:     General: No focal deficit present.     Mental Status: She is alert.  Psychiatric:        Mood and Affect: Mood normal.        Behavior: Behavior normal.       Assessment & Plan:  1. Gastroesophageal reflux disease with esophagitis without hemorrhage -Appears to be GERD related.  We will have her try Prilosec  for 30 days. - omeprazole (PRILOSEC) 20 MG capsule; Take 1 capsule (20 mg total) by mouth daily.  Dispense: 30 capsule; Refill: 1  2. Caregiver role strain -Start her on low-dose Celexa.  Last EKG showed normal sinus rhythm.  She will follow-up in 30 days or sooner if needed.  Side effects reviewed with the patient - citalopram (CELEXA) 10 MG tablet; Take 1 tablet (10 mg total) by mouth daily.  Dispense: 30 tablet; Refill: [place], NP   Time spent on chart review, time with patient; discussion of GERD an Caregiver burden, anxiety, treatment, follow up plan, and documentation 30 minutes

## 2019-11-21 ENCOUNTER — Other Ambulatory Visit: Payer: Self-pay | Admitting: Obstetrics and Gynecology

## 2019-11-23 ENCOUNTER — Other Ambulatory Visit: Payer: Self-pay | Admitting: Adult Health

## 2019-11-23 DIAGNOSIS — Z638 Other specified problems related to primary support group: Secondary | ICD-10-CM

## 2019-11-23 DIAGNOSIS — K2100 Gastro-esophageal reflux disease with esophagitis, without bleeding: Secondary | ICD-10-CM

## 2019-11-23 NOTE — Telephone Encounter (Signed)
DENIED.  SENT IN FOR 2 MONTHS ON 11/01/19.  REQUEST IS TOO EARLY.

## 2019-11-24 ENCOUNTER — Telehealth (INDEPENDENT_AMBULATORY_CARE_PROVIDER_SITE_OTHER): Payer: PPO | Admitting: Family Medicine

## 2019-11-24 ENCOUNTER — Other Ambulatory Visit: Payer: Self-pay | Admitting: Adult Health

## 2019-11-24 ENCOUNTER — Encounter: Payer: Self-pay | Admitting: Family Medicine

## 2019-11-24 DIAGNOSIS — R21 Rash and other nonspecific skin eruption: Secondary | ICD-10-CM | POA: Diagnosis not present

## 2019-11-24 DIAGNOSIS — L0390 Cellulitis, unspecified: Secondary | ICD-10-CM | POA: Diagnosis not present

## 2019-11-24 DIAGNOSIS — L989 Disorder of the skin and subcutaneous tissue, unspecified: Secondary | ICD-10-CM | POA: Diagnosis not present

## 2019-11-24 NOTE — Progress Notes (Signed)
Virtual Visit via Video Note  I connected with Jennifer Donaldson  on 11/24/19 at  6:00 PM EDT by a video enabled telemedicine application and verified that I am speaking with the correct person using two identifiers.  Location patient: home Location provider:work or home office Persons participating in the virtual visit: patient, provider  I discussed the limitations of evaluation and management by telemedicine and the availability of in person appointments. The patient expressed understanding and agreed to proceed.   HPI:  Acute visit for a rash: -started about 5-7 days ago -started with a little itchy bump near her bra - but now has little bump spread all over her back -very itchy -has not put anything on it -she also has an itchy spot right on her L upper forehead forehead -reports was having some scaly skin  next to her L ear where she had skin cancer surgery in the past, and now after scratching has developed some redness in this area the last few days - has a dermatologist -no malaise, fevers, lesions elsewhere, pain -denies pets, new medication in the last few weeks, outdoor exposures  ROS: See pertinent positives and negatives per HPI.  Past Medical History:  Diagnosis Date  . C. difficile colitis   . CAD (coronary artery disease)   . Dyslipidemia 12/24/2006   Qualifier: Diagnosis of  By: Scherrie Gerlach    . Hyperlipidemia   . Mitral regurgitation   . MITRAL REGURGITATION 08/12/2008   Qualifier: Diagnosis of  By: Burnett Kanaris    . Osteopenia   . Osteoporosis 03/01/2014  . Raynaud's disease   . RAYNAUD'S DISEASE 12/24/2006   Qualifier: Diagnosis of  By: Scherrie Gerlach    . Special screening for malignant neoplasm of colon     Past Surgical History:  Procedure Laterality Date  . BREAST EXCISIONAL BIOPSY Left   . CORONARY STENT PLACEMENT     2 prior drug eluting stent placed in the LAD and right coronary artery     Family History  Problem Relation Age of Onset  . CAD  Brother   . Stroke Father   . CAD Mother   . Breast cancer Neg Hx     SOCIAL HX: see hpi   Current Outpatient Medications:  .  alendronate (FOSAMAX) 70 MG tablet, Take 70 mg by mouth once a week., Disp: , Rfl: 11 .  amLODipine (NORVASC) 2.5 MG tablet, TAKE 1 TABLET (2.5 MG TOTAL) BY MOUTH DAILY., Disp: 90 tablet, Rfl: 1 .  aspirin 81 MG tablet, Take 81 mg by mouth daily.  , Disp: , Rfl:  .  atorvastatin (LIPITOR) 20 MG tablet, Take 1 tablet (20 mg total) by mouth daily at 6 PM., Disp: 90 tablet, Rfl: 1 .  calcium citrate-vitamin D (CITRACAL+D) 315-200 MG-UNIT per tablet, Take 2 tablets by mouth daily. , Disp: , Rfl:  .  Cholecalciferol (VITAMIN D3) 1000 UNITS CAPS, Take 1 tablet by mouth daily.  , Disp: , Rfl:  .  citalopram (CELEXA) 10 MG tablet, Take 1 tablet (10 mg total) by mouth daily., Disp: 30 tablet, Rfl: 1 .  doxycycline (VIBRAMYCIN) 50 MG capsule, Take 50 mg oral daily as needed for Rosacea, Disp: , Rfl:  .  ezetimibe (ZETIA) 10 MG tablet, Take 10 mg by mouth daily., Disp: , Rfl:  .  meloxicam (MOBIC) 15 MG tablet, TAKE 1 (ONE) TABLET DAILY AS NEEDED FOR PAIN, Disp: 90 tablet, Rfl: 1 .  omeprazole (PRILOSEC) 20 MG capsule, Take 1  capsule (20 mg total) by mouth daily., Disp: 30 capsule, Rfl: 1  EXAM:  VITALS per patient if applicable:  GENERAL: alert, oriented, appears well and in no acute distress  HEENT: atraumatic, conjunttiva clear, no obvious abnormalities on inspection of external nose and ears  NECK: normal movements of the head and neck  LUNGS: on inspection no signs of respiratory distress, breathing rate appears normal, no obvious gross SOB, gasping or wheezing  CV: no obvious cyanosis  SKIN: on video visit exam - has small flesh colored papules in irr scattered distrubution bilaterally on the back with excoriated erythematous tops on each papule.  Around the L ear has some scaly skin and small erythema ant to the ear without visible edema or drainage  MS:  moves all visible extremities without noticeable abnormality  PSYCH/NEURO: pleasant and cooperative, no obvious depression or anxiety, speech and thought processing grossly intact  ASSESSMENT AND PLAN:  Discussed the following assessment and plan:  Skin rash  Skin lesion  Cellulitis, unspecified cellulitis site  -we discussed possible serious and likely etiologies, options for evaluation and workup, limitations of telemedicine visit vs in person visit, treatment, treatment risks and precautions. Pt prefers to treat via telemedicine empirically rather then risking or undertaking an in person visit at this moment. The lesions on the back appear very different from what I can see on the face. I think they may be bug bites and advised trial of an oral antihistamine such as allegra or zyrtec once daily for 1-2 weeks and topical hydrocortisone cream. Advised not to scratch. I advised dermatology follow up for the facial lesion particularly given her hx of skin cancer. I am concerned for infection since she feels the redness just developed a few days ago. She prefers to try empiric treatment with doxy which she has tolerated for rosacea abd agrees to follow up with PCP or dermatologist in 3-5 days or seek care at [facility] if any spreading, worsening, new symptoms or is not improving over the weekend.   Advised to stop any doxy she may be taking for rosacea while on the higher dose for this.   I discussed the assessment and treatment plan with the patient. The patient was provided an opportunity to ask questions and all were answered. The patient agreed with the plan and demonstrated an understanding of the instructions.   The patient was advised to call back or seek an in-person evaluation if the symptoms worsen or if the condition fails to improve as anticipated.   Lucretia Kern, DO

## 2019-11-24 NOTE — Patient Instructions (Addendum)
-  zyrtec OR allegra once daily for 2 weeks  -topical hydrocortisone for the itchy bumps on the back  -start the antibiotic doxycycline 100mg  twice daily for 5 days for possible skin infection (STOP the 50mg  doxycycline while you are taking this)  -follow up in person with [facility] or your dermatologist in the next 3-5 days  -if you get worse, have new symptoms or are not improving on the treatment over the weekend  - please follow up with your primary care office sooner or seek in person evaluation at a local urgent care clinic

## 2019-11-24 NOTE — Telephone Encounter (Signed)
SENT TO THE PHARMACY BY E-SCRIBE. 

## 2019-11-25 ENCOUNTER — Telehealth: Payer: Self-pay | Admitting: Adult Health

## 2019-11-25 MED ORDER — DOXYCYCLINE HYCLATE 100 MG PO TABS
100.0000 mg | ORAL_TABLET | Freq: Two times a day (BID) | ORAL | 0 refills | Status: DC
Start: 2019-11-25 — End: 2019-12-20

## 2019-11-25 NOTE — Telephone Encounter (Signed)
Per office notes from Dr Maudie Mercury on 8/12, I spoke with the pt and informed her the Rx for Doxycycline 100mg  was sent to [facility].

## 2019-11-25 NOTE — Telephone Encounter (Signed)
Pt is calling in stating the she had a visit with Dr. Maudie Mercury on yesterday and was told that she was going to get a refill of the meloxicam (MOBIC)100 MG (pt is not sure but she thinks that it was) instead of the 15 MG for the rash that she has and it is very itchy.  Pharm: [facility] on [address]  Pt would like to have a call back.

## 2019-11-29 ENCOUNTER — Telehealth: Payer: Self-pay | Admitting: Adult Health

## 2019-11-29 NOTE — Telephone Encounter (Signed)
Error-saw prior note

## 2019-11-29 NOTE — Telephone Encounter (Signed)
Pt stated she would like to inform her PCP that she stopped taking the Citalopram because she noticed she would get confused and her children noticed that as well. She also said she stopped taking the Doxycycline as well because she could not eat taking it and lost weight due to that.    Pt is scheduled for an appt on 8/24   Pt can be reached at [phone]

## 2019-12-02 DIAGNOSIS — Z20828 Contact with and (suspected) exposure to other viral communicable diseases: Secondary | ICD-10-CM | POA: Diagnosis not present

## 2019-12-04 ENCOUNTER — Other Ambulatory Visit: Payer: Self-pay | Admitting: Internal Medicine

## 2019-12-06 ENCOUNTER — Telehealth (INDEPENDENT_AMBULATORY_CARE_PROVIDER_SITE_OTHER): Payer: PPO | Admitting: Adult Health

## 2019-12-06 ENCOUNTER — Other Ambulatory Visit: Payer: Self-pay

## 2019-12-06 DIAGNOSIS — R05 Cough: Secondary | ICD-10-CM

## 2019-12-06 DIAGNOSIS — R059 Cough, unspecified: Secondary | ICD-10-CM

## 2019-12-06 MED ORDER — BENZONATATE 200 MG PO CAPS: 200.0000 mg | ORAL_CAPSULE | Freq: Two times a day (BID) | ORAL | 1 refills | Status: DC | PRN

## 2019-12-06 NOTE — Progress Notes (Signed)
Virtual Visit via Telephone Note  I connected with Jennifer Donaldson on 12/06/19 at  1:30 PM EDT by telephone and verified that I am speaking with the correct person using two identifiers.   I discussed the limitations, risks, security and privacy concerns of performing an evaluation and management service by telephone and the availability of in person appointments. I also discussed with the patient that there may be a patient responsible charge related to this service. The patient expressed understanding and agreed to proceed.  Location patient: home Location provider: work or home office Participants present for the call: patient, provider Patient did not have a visit in the prior 7 days to address this/these issue(s).   History of Present Illness: 86 year old female who is being evaluated today for an acute issue of 3 to 4 days of a dry continuous cough, sore throat, and fatigue.  She denies fever, chills, shortness of breath.  She was tested for COVID-19 3 days ago but has not received the results yet.  Reports that she has had loss of appetite but no loss of taste or smell.   Observations/Objective: Patient sounds cheerful and well on the phone. I do not appreciate any SOB. Speech and thought processing are grossly intact. Patient reported vitals:  Assessment and Plan: 1. Cough -She will update [place] when she does have her COVID-19 test back.  In the meantime we will send in [place] to help with her cough.  Advised follow-up if needed - benzonatate (TESSALON) 200 MG capsule; Take 1 capsule (200 mg total) by mouth 2 (two) times daily as needed for cough.  Dispense: 20 capsule; Refill: 1  Follow Up Instructions:   I did not refer this patient for an OV in the next 24 hours for this/these issue(s).  I discussed the assessment and treatment plan with the patient. The patient was provided an opportunity to ask questions and all were answered. The patient agreed with the plan  and demonstrated an understanding of the instructions.   The patient was advised to call back or seek an in-person evaluation if the symptoms worsen or if the condition fails to improve as anticipated.  I provided 12 minutes of non-face-to-face time during this encounter.   Dorothyann Peng, NP

## 2019-12-07 ENCOUNTER — Telehealth: Payer: Self-pay | Admitting: Adult Health

## 2019-12-07 NOTE — Telephone Encounter (Signed)
Message sent to PCP for review

## 2019-12-07 NOTE — Telephone Encounter (Signed)
Pt stated she had a virtual call with [facility] yesterday and she is suppose to update him on her COVID test. She tested negative. Pt stated she will call later in the week to set up an appt

## 2019-12-09 ENCOUNTER — Other Ambulatory Visit: Payer: Self-pay

## 2019-12-09 ENCOUNTER — Encounter (HOSPITAL_COMMUNITY): Payer: Self-pay

## 2019-12-09 ENCOUNTER — Ambulatory Visit (HOSPITAL_COMMUNITY)
Admission: EM | Admit: 2019-12-09 | Discharge: 2019-12-09 | Disposition: A | Discharge status: Home / Self Care | Payer: PPO | Attending: Emergency Medicine | Admitting: Emergency Medicine

## 2019-12-09 ENCOUNTER — Ambulatory Visit (INDEPENDENT_AMBULATORY_CARE_PROVIDER_SITE_OTHER): Payer: PPO

## 2019-12-09 DIAGNOSIS — I2510 Atherosclerotic heart disease of native coronary artery without angina pectoris: Secondary | ICD-10-CM

## 2019-12-09 DIAGNOSIS — R05 Cough: Secondary | ICD-10-CM

## 2019-12-09 DIAGNOSIS — Z87891 Personal history of nicotine dependence: Secondary | ICD-10-CM

## 2019-12-09 DIAGNOSIS — R630 Anorexia: Secondary | ICD-10-CM | POA: Diagnosis not present

## 2019-12-09 DIAGNOSIS — J029 Acute pharyngitis, unspecified: Secondary | ICD-10-CM

## 2019-12-09 DIAGNOSIS — R918 Other nonspecific abnormal finding of lung field: Secondary | ICD-10-CM | POA: Diagnosis not present

## 2019-12-09 DIAGNOSIS — F17200 Nicotine dependence, unspecified, uncomplicated: Secondary | ICD-10-CM | POA: Diagnosis not present

## 2019-12-09 DIAGNOSIS — J439 Emphysema, unspecified: Secondary | ICD-10-CM | POA: Diagnosis not present

## 2019-12-09 DIAGNOSIS — R634 Abnormal weight loss: Secondary | ICD-10-CM | POA: Diagnosis not present

## 2019-12-09 DIAGNOSIS — R059 Cough, unspecified: Secondary | ICD-10-CM

## 2019-12-09 LAB — COMPREHENSIVE METABOLIC PANEL
ALT: 25 U/L (ref 0–44)
AST: 36 U/L (ref 15–41)
Albumin: 3 g/dL — ABNORMAL LOW (ref 3.5–5.0)
Alkaline Phosphatase: 79 U/L (ref 38–126)
Anion gap: 11 (ref 5–15)
BUN: 12 mg/dL (ref 8–23)
CO2: 26 mmol/L (ref 22–32)
Calcium: 9.6 mg/dL (ref 8.9–10.3)
Chloride: 95 mmol/L — ABNORMAL LOW (ref 98–111)
Creatinine, Ser: 0.64 mg/dL (ref 0.44–1.00)
GFR calc Af Amer: >60 mL/min (ref >60)
GFR calc non Af Amer: >60 mL/min (ref >60)
Glucose, Bld: 103 mg/dL — ABNORMAL HIGH (ref 70–99)
Potassium: 4.5 mmol/L (ref 3.5–5.1)
Sodium: 132 mmol/L — ABNORMAL LOW (ref 135–145)
Total Bilirubin: 0.8 mg/dL (ref 0.3–1.2)
Total Protein: 7.7 g/dL (ref 6.5–8.1)

## 2019-12-09 LAB — CBC WITH DIFFERENTIAL/PLATELET
Abs Immature Granulocytes: 0.12 10*3/uL — ABNORMAL HIGH (ref 0.00–0.07)
Basophils Absolute: 0.1 10*3/uL (ref 0.0–0.1)
Basophils Relative: 1 %
Eosinophils Absolute: 0.1 10*3/uL (ref 0.0–0.5)
Eosinophils Relative: 0 %
HCT: 35.4 % — ABNORMAL LOW (ref 36.0–46.0)
Hemoglobin: 11.3 g/dL — ABNORMAL LOW (ref 12.0–15.0)
Immature Granulocytes: 1 %
Lymphocytes Relative: 13 %
Lymphs Abs: 2.3 10*3/uL (ref 0.7–4.0)
MCH: 29.3 pg (ref 26.0–34.0)
MCHC: 31.9 g/dL (ref 30.0–36.0)
MCV: 91.7 fL (ref 80.0–100.0)
Monocytes Absolute: 1.3 10*3/uL — ABNORMAL HIGH (ref 0.1–1.0)
Monocytes Relative: 7 %
Neutro Abs: 14.2 10*3/uL — ABNORMAL HIGH (ref 1.7–7.7)
Neutrophils Relative %: 78 %
Platelets: 781 10*3/uL — ABNORMAL HIGH (ref 150–400)
RBC: 3.86 MIL/uL — ABNORMAL LOW (ref 3.87–5.11)
RDW: 13.2 % (ref 11.5–15.5)
WBC: 18 10*3/uL — ABNORMAL HIGH (ref 4.0–10.5)
nRBC: 0 % (ref 0.0–0.2)

## 2019-12-09 MED ORDER — LEVOFLOXACIN 750 MG PO TABS: 750.0000 mg | ORAL_TABLET | Freq: Every day | ORAL | 0 refills | Status: AC

## 2019-12-09 MED ORDER — PREDNISONE 20 MG PO TABS: 40.0000 mg | ORAL_TABLET | Freq: Every day | ORAL | 0 refills | Status: AC

## 2019-12-09 NOTE — Discharge Instructions (Addendum)
There is evidence of concern for infection vs mass to your right lung. You vitals looks well today. I am going to try a different antibiotic as well as some steroids to see if this helps with your symptoms.  I have sent a message to your PCP and would like to you call to set up follow up appointment for recheck as you will need repeat imaging. If any worsening of symptoms please go to the ER

## 2019-12-09 NOTE — ED Triage Notes (Addendum)
Pt is here with a cough that started 10 days ago, pt had a E-Visit on Tuesday & was prescribed Benzonatate 200MG . Pt was COVID tested on Friday & her results came back NEGATIVE.

## 2019-12-09 NOTE — ED Provider Notes (Signed)
[place]    CSN: [id number] Arrival date & time: 12/09/19  1057      History   Chief Complaint Chief Complaint  Patient presents with   Cough    HPI Jennifer Donaldson is a 86 y.o. female.   Jennifer Donaldson presents with her daughter with complaints of cough and fatigue. Cares for her disabled husband. Was given doxycycline previously for cellulitis, appears to have been in June, and feels that has since lost her appetite. Has lost 10 lbs recently. Has also recently developed a dry cough. Cough causes chest soreness and back pain. Causing throat and esophageal irritation. Cough started approximately 1.5 week ago. Her sense of taste has changed since taking doxycycline. Her mouth has been dry. covid tested at [facility] by rapid test which was negative. Has gotten full series of covid vaccines. No known ill contacts. Has been staying at home. No fevers. No nasal drainage. No shortness of breath. No dizziness, but overall weakness. No loss of smell. No headache. No new body aches. History of reflux, does have omeprazole prescribed. Hasn't smoked in 45 years.    ROS per HPI, negative if not otherwise mentioned.      Past Medical History:  Diagnosis Date   C. difficile colitis    CAD (coronary artery disease)    Dyslipidemia 12/24/2006   Qualifier: Diagnosis of  By: Sherren Mocha RN, Ellen     Hyperlipidemia    Mitral regurgitation    MITRAL REGURGITATION 08/12/2008   Qualifier: Diagnosis of  By: Burnett Kanaris     Osteopenia    Osteoporosis 03/01/2014   Raynaud's disease    RAYNAUD'S DISEASE 12/24/2006   Qualifier: Diagnosis of  By: Sherren Mocha RN, Dorian Pod     Special screening for malignant neoplasm of colon     Patient Active Problem List   Diagnosis Date Noted   Trigger finger, right ring finger 11/24/2017   Follow up 08/30/2014   Osteoporosis 03/01/2014   MITRAL REGURGITATION 08/12/2008   CAD (coronary artery disease) 08/12/2008    Dyslipidemia 12/24/2006   RAYNAUD'S DISEASE 12/24/2006    Past Surgical History:  Procedure Laterality Date   BREAST EXCISIONAL BIOPSY Left    CORONARY STENT PLACEMENT     2 prior drug eluting stent placed in the LAD and right coronary artery     OB History   No obstetric history on file.      Home Medications    Prior to Admission medications   Medication Sig Start Date End Date Taking? Authorizing Provider  alendronate (FOSAMAX) 70 MG tablet Take 70 mg by mouth once a week. 02/14/14   [provider]  amLODipine (NORVASC) 2.5 MG tablet TAKE 1 TABLET (2.5 MG TOTAL) BY MOUTH DAILY. 11/24/19   Nafziger, Tommi Rumps, NP  aspirin 81 MG tablet Take 81 mg by mouth daily.      [provider]  atorvastatin (LIPITOR) 20 MG tablet Take 1 tablet (20 mg total) by mouth daily at 6 PM. 07/01/19   Fay Records, MD  benzonatate (TESSALON) 200 MG capsule Take 1 capsule (200 mg total) by mouth 2 (two) times daily as needed for cough. 12/06/19   Nafziger, Tommi Rumps, NP  calcium citrate-vitamin D (CITRACAL+D) 315-200 MG-UNIT per tablet Take 2 tablets by mouth daily.     [provider]  Cholecalciferol (VITAMIN D3) 1000 UNITS CAPS Take 1 tablet by mouth daily.      [provider]  doxycycline (VIBRA-TABS) 100 MG tablet Take 1  tablet (100 mg total) by mouth 2 (two) times daily. 11/25/19   Lucretia Kern, DO  doxycycline (VIBRAMYCIN) 50 MG capsule Take 50 mg oral daily as needed for Rosacea 10/12/19   [provider]  ezetimibe (ZETIA) 10 MG tablet TAKE 1 TABLET BY MOUTH EVERY DAY 12/06/19   Fay Records, MD  levofloxacin (LEVAQUIN) 750 MG tablet Take 1 tablet (750 mg total) by mouth daily for 5 days. 12/09/19 12/14/19  Zigmund Gottron, NP  meloxicam (MOBIC) 15 MG tablet TAKE 1 (ONE) TABLET DAILY AS NEEDED FOR PAIN 06/22/19   Nafziger, Tommi Rumps, NP  omeprazole (PRILOSEC) 20 MG capsule Take 1 capsule (20 mg total) by mouth daily. 11/01/19   Nafziger, Tommi Rumps, NP  predniSONE  (DELTASONE) 20 MG tablet Take 2 tablets (40 mg total) by mouth daily with breakfast for 5 days. 12/09/19 12/14/19  Zigmund Gottron, NP    Family History Family History  Problem Relation Age of Onset   CAD Brother    Stroke Father    CAD Mother    Breast cancer Neg Hx     Social History Social History   Tobacco Use   Smoking status: Former Smoker    Packs/day: 0.50    Years: 20.00    Pack years: 10.00    Types: Cigarettes    Quit date: 09/01/1983    Years since quitting: 36.2   Smokeless tobacco: Never Used  Vaping Use   Vaping Use: Never used  Substance Use Topics   Alcohol use: No    Alcohol/week: 0.0 standard drinks   Drug use: No     Allergies   Celexa [citalopram] and Codeine sulfate   Review of Systems Review of Systems   Physical Exam Triage Vital Signs ED Triage Vitals  Enc Vitals Group     BP 12/09/19 1342 (!) 141/66     Pulse Rate 12/09/19 1342 92     Resp 12/09/19 1342 15     Temp 12/09/19 1342 98.2 F (36.8 C)     Temp Source 12/09/19 1342 Oral     SpO2 12/09/19 1342 100 %     Weight --      Height --      Head Circumference --      Peak Flow --      Pain Score 12/09/19 1338 0     Pain Loc --      Pain Edu? --      Excl. in [place]? --    No data found.  Updated Vital Signs BP (!) 141/66 (BP Location: Right Arm)    Pulse 92    Temp 98.2 F (36.8 C) (Oral)    Resp 15    SpO2 100%   Physical Exam Constitutional:      General: She is not in acute distress.    Appearance: She is well-developed. She is not ill-appearing.  Cardiovascular:     Rate and Rhythm: Normal rate and regular rhythm.  Pulmonary:     Effort: Pulmonary effort is normal.     Breath sounds: Normal breath sounds.  Skin:    General: Skin is warm and dry.  Neurological:     Mental Status: She is alert and oriented to person, place, and time.      UC Treatments / Results  Labs (all labs ordered are listed, but only abnormal results are displayed) Labs  Reviewed  CBC WITH DIFFERENTIAL/PLATELET  COMPREHENSIVE METABOLIC PANEL    EKG   Radiology DG  Chest 2 View  Result Date: 12/09/2019 CLINICAL DATA:  Sick for 2 weeks, loss of appetite cough, sore throat with coughing, negative COVID-19 test a week ago, vaccinated, past history of coronary artery disease, former smoker EXAM: CHEST - 2 VIEW COMPARISON:  11/29/2004 FINDINGS: Normal heart size, mediastinal contours, and pulmonary vascularity. Atherosclerotic calcification aorta. Focal RIGHT upper lobe opacity adjacent to the major fissure, appears masslike on lateral view measuring approximately 5.4 x 5.3 x 5.8 cm, question infiltrate versus mass. Underlying emphysematous changes. No additional infiltrate, pleural effusion or pneumothorax. Osseous structures demineralized. Degenerative disc disease changes and focal kyphosis at thoracolumbar junction. IMPRESSION: Emphysematous changes with question mass versus infiltrate RIGHT upper lobe; either radiographic follow-up until resolution or further evaluation by CT chest with contrast recommended to exclude malignancy. Aortic Atherosclerosis (ICD10-I70.0). These results will be called to the ordering clinician or representative by the Radiologist Assistant, and communication documented in the PACS or [employer]. Electronically Signed   By: Lavonia Dana M.D.   On: 12/09/2019 14:11    Procedures Procedures (including critical care time)  Medications Ordered in UC Medications - No data to display  Initial Impression / Assessment and Plan / UC Course  I have reviewed the triage vital signs and the nursing notes.  Pertinent labs & imaging results that were available during my care of the patient were reviewed by me and considered in my medical decision making (see chart for details).     Non toxic. Benign physical exam.  No work of breathing. Vitals stable here today. No fevers. No significant gi complaints. Loss of appetite, weight loss. Cough.  Chest xray with infiltrate vs mass. Antibiotics initiated as well as steroids, with follow up encouraged with PCP. Return precautions provided. Baseline labs obtained as well. Patient and daughter verbalized understanding and agreeable to plan.   Final Clinical Impressions(s) / UC Diagnoses   Final diagnoses:  Cough  Pulmonary infiltrate  Loss of weight     Discharge Instructions     There is evidence of concern for infection vs mass to your right lung. You vitals looks well today. I am going to try a different antibiotic as well as some steroids to see if this helps with your symptoms.  I have sent a message to your PCP and would like to you call to set up follow up appointment for recheck as you will need repeat imaging. If any worsening of symptoms please go to the ER    ED Prescriptions    Medication Sig Dispense Auth. Provider   levofloxacin (LEVAQUIN) 750 MG tablet Take 1 tablet (750 mg total) by mouth daily for 5 days. 5 tablet Augusto Gamble B, NP   predniSONE (DELTASONE) 20 MG tablet Take 2 tablets (40 mg total) by mouth daily with breakfast for 5 days. 10 tablet Zigmund Gottron, NP     PDMP not reviewed this encounter.   Zigmund Gottron, NP 12/09/19 1520

## 2019-12-10 ENCOUNTER — Ambulatory Visit (HOSPITAL_COMMUNITY): Payer: Self-pay

## 2019-12-20 ENCOUNTER — Encounter: Payer: Self-pay | Admitting: Adult Health

## 2019-12-20 ENCOUNTER — Ambulatory Visit (INDEPENDENT_AMBULATORY_CARE_PROVIDER_SITE_OTHER): Payer: PPO | Admitting: Adult Health

## 2019-12-20 ENCOUNTER — Other Ambulatory Visit: Payer: Self-pay

## 2019-12-20 VITALS — BP 136/70 | HR 110 | Temp 98.4°F | Wt 83.0 lb

## 2019-12-20 DIAGNOSIS — R911 Solitary pulmonary nodule: Secondary | ICD-10-CM | POA: Diagnosis not present

## 2019-12-20 DIAGNOSIS — R300 Dysuria: Secondary | ICD-10-CM | POA: Diagnosis not present

## 2019-12-20 DIAGNOSIS — J189 Pneumonia, unspecified organism: Secondary | ICD-10-CM | POA: Diagnosis not present

## 2019-12-20 LAB — CBC WITH DIFFERENTIAL/PLATELET
Absolute Monocytes: 1179 cells/uL — ABNORMAL HIGH (ref 200–950)
Basophils Absolute: 85 cells/uL (ref 0–200)
Basophils Relative: 0.6 %
Eosinophils Absolute: 156 cells/uL (ref 15–500)
Eosinophils Relative: 1.1 %
HCT: 35.6 % (ref 35.0–45.0)
Hemoglobin: 11.8 g/dL (ref 11.7–15.5)
Lymphs Abs: 2485 cells/uL (ref 850–3900)
MCH: 30.5 pg (ref 27.0–33.0)
MCHC: 33.1 g/dL (ref 32.0–36.0)
MCV: 92 fL (ref 80.0–100.0)
MPV: 9.3 fL (ref 7.5–12.5)
Monocytes Relative: 8.3 %
Neutro Abs: 10295 cells/uL — ABNORMAL HIGH (ref 1500–7800)
Neutrophils Relative %: 72.5 %
Platelets: 563 10*3/uL — ABNORMAL HIGH (ref 140–400)
RBC: 3.87 10*6/uL (ref 3.80–5.10)
RDW: 12.6 % (ref 11.0–15.0)
Total Lymphocyte: 17.5 %
WBC: 14.2 10*3/uL — ABNORMAL HIGH (ref 3.8–10.8)

## 2019-12-20 NOTE — Addendum Note (Signed)
Addended by: Rebecca Eaton on: 12/20/2019 02:07 PM   Modules accepted: Orders

## 2019-12-20 NOTE — Progress Notes (Signed)
Subjective:    Patient ID: Jennifer Donaldson, female    DOB: Sep 03, 1932, 86 y.o.   MRN: [id number]  HPI 86 year old female who  has a past medical history of C. difficile colitis, CAD (coronary artery disease), Dyslipidemia (12/24/2006), Hyperlipidemia, Mitral regurgitation, MITRAL REGURGITATION (08/12/2008), Osteopenia, Osteoporosis (03/01/2014), Raynaud's disease, RAYNAUD'S DISEASE (12/24/2006), and Special screening for malignant neoplasm of colon.  She presents to the office today for follow-up after being seen at [facility] urgent care on 12/09/2019.  He was seen for dry cough and loss of appetite.  She has lost 10 pounds recently.  The cough was causing her chest to be sore and back pain as well as throat and esophageal irritation.  Her cough started approximately 1.5 weeks ago.  Her to being seen she had a rapid Covid test which was negative and has been fully vaccinated  Chest x-ray at urgent care showed infiltrate versus mass.  She was started on Levaquin 750 mg daily x5 days and prednisone taper.  She was advised to follow-up with her PCP.  Today she reports that overall she is feeling better after she finished the antibiotics but the antibiotics caused her to feel nauseous.  She was able to finish her course but lost more weight during this timeframe due to not having an appetite due to nausea.  She does feel as though her appetite is coming back slowly and she is able to tolerate p.o. intake.  She continues to have a mild cough with mild sputum production but this is getting better as well.  She would like to check a urinalysis today as she does have intermittent episodes of dysuria.  Denies any other symptoms of a UTI.       Review of Systems  Constitutional: Positive for fatigue.  Respiratory: Positive for cough.   Cardiovascular: Negative.   Genitourinary: Positive for dysuria.  Neurological: Positive for weakness.  Psychiatric/Behavioral: Negative.   All other systems reviewed  and are negative.    Past Medical History:  Diagnosis Date   C. difficile colitis    CAD (coronary artery disease)    Dyslipidemia 12/24/2006   Qualifier: Diagnosis of  By: Sherren Mocha RN, Ellen     Hyperlipidemia    Mitral regurgitation    MITRAL REGURGITATION 08/12/2008   Qualifier: Diagnosis of  By: Burnett Kanaris     Osteopenia    Osteoporosis 03/01/2014   Raynaud's disease    RAYNAUD'S DISEASE 12/24/2006   Qualifier: Diagnosis of  By: Sherren Mocha RN, Dorian Pod     Special screening for malignant neoplasm of colon     Social History   Socioeconomic History   Marital status: Married    Spouse name: Not on file   Number of children: Not on file   Years of education: Not on file   Highest education level: Not on file  Occupational History   Not on file  Tobacco Use   Smoking status: Former Smoker    Packs/day: 0.50    Years: 20.00    Pack years: 10.00    Types: Cigarettes    Quit date: 09/01/1983    Years since quitting: 36.3   Smokeless tobacco: Never Used  Vaping Use   Vaping Use: Never used  Substance and Sexual Activity   Alcohol use: No    Alcohol/week: 0.0 standard drinks   Drug use: No   Sexual activity: Yes    Birth control/protection: None    Comment: Married  Other Topics Concern  Not on file  Social History Narrative   Retired Engineer, structural   Married for 62 years - husband has multiple medical issues and is disabled   4 children   Grew up in [place] Strain:    Difficulty of Paying Living Expenses: Not on [employer] Insecurity:    Worried About Charity fundraiser in the Last Year: Not on file   [place] in the Last Year: Not on file  Transportation Needs:    Film/video editor (Medical): Not on file   Lack of Transportation (Non-Medical): Not on file  Physical Activity:    Days of Exercise per Week: Not on file   Minutes of Exercise per  Session: Not on file  Stress:    Feeling of Stress : Not on file  Social Connections:    Frequency of Communication with Friends and Family: Not on file   Frequency of Social Gatherings with Friends and Family: Not on file   Attends Religious Services: Not on file   Active Member of Clubs or Organizations: Not on file   Attends Archivist Meetings: Not on file   Marital Status: Not on file  Intimate Partner Violence:    Fear of Current or Ex-Partner: Not on file   Emotionally Abused: Not on file   Physically Abused: Not on file   Sexually Abused: Not on file    Past Surgical History:  Procedure Laterality Date   BREAST EXCISIONAL BIOPSY Left    CORONARY STENT PLACEMENT     2 prior drug eluting stent placed in the LAD and right coronary artery     Family History  Problem Relation Age of Onset   CAD Brother    Stroke Father    CAD Mother    Breast cancer Neg Hx     Allergies  Allergen Reactions   Celexa [Citalopram]     Confusion     Codeine Sulfate     REACTION: sensitivity to    Current Outpatient Medications on File Prior to Visit  Medication Sig Dispense Refill   alendronate (FOSAMAX) 70 MG tablet Take 70 mg by mouth once a week.  11   amLODipine (NORVASC) 2.5 MG tablet TAKE 1 TABLET (2.5 MG TOTAL) BY MOUTH DAILY. 90 tablet 1   aspirin 81 MG tablet Take 81 mg by mouth daily.       atorvastatin (LIPITOR) 20 MG tablet Take 1 tablet (20 mg total) by mouth daily at 6 PM. 90 tablet 1   benzonatate (TESSALON) 200 MG capsule Take 1 capsule (200 mg total) by mouth 2 (two) times daily as needed for cough. 20 capsule 1   calcium citrate-vitamin D (CITRACAL+D) 315-200 MG-UNIT per tablet Take 2 tablets by mouth daily.      Cholecalciferol (VITAMIN D3) 1000 UNITS CAPS Take 1 tablet by mouth daily.       doxycycline (VIBRA-TABS) 100 MG tablet Take 1 tablet (100 mg total) by mouth 2 (two) times daily. 10 tablet 0   doxycycline (VIBRAMYCIN)  50 MG capsule Take 50 mg oral daily as needed for Rosacea     ezetimibe (ZETIA) 10 MG tablet TAKE 1 TABLET BY MOUTH EVERY DAY 90 tablet 2   meloxicam (MOBIC) 15 MG tablet TAKE 1 (ONE) TABLET DAILY AS NEEDED FOR PAIN 90 tablet 1   omeprazole (PRILOSEC) 20 MG capsule Take 1 capsule (20 mg total) by mouth  daily. 30 capsule 1   No current facility-administered medications on file prior to visit.    There were no vitals taken for this visit.      Objective:   Physical Exam Vitals and nursing note reviewed.  Constitutional:      Appearance: She is underweight.  Cardiovascular:     Rate and Rhythm: Normal rate and regular rhythm.     Pulses: Normal pulses.     Heart sounds: Normal heart sounds.  Pulmonary:     Effort: Pulmonary effort is normal.     Breath sounds: Normal breath sounds.  Musculoskeletal:        General: Normal range of motion.  Skin:    General: Skin is warm and dry.     Capillary Refill: Capillary refill takes less than 2 seconds.  Neurological:     General: No focal deficit present.     Mental Status: She is alert.  Psychiatric:        Mood and Affect: Mood normal.        Behavior: Behavior normal.        Thought Content: Thought content normal.        Judgment: Judgment normal.       Assessment & Plan:  1. Pneumonia of right lower lobe due to infectious organism -Seems to be resolving. Can supplement with Ensure throughout the day. - Follow up as needed - CBC with Differential/Platelet - CT Chest W Contrast; Future  2. Dysuria  - Urinalysis  3. Solitary pulmonary nodule  - CT Chest W Contrast; Future   Dorothyann Peng, NP

## 2019-12-21 ENCOUNTER — Telehealth: Payer: Self-pay | Admitting: Adult Health

## 2019-12-21 LAB — URINALYSIS
Bilirubin Urine: NEGATIVE
Glucose, UA: NEGATIVE
Hgb urine dipstick: NEGATIVE
Ketones, ur: NEGATIVE
Leukocytes,Ua: NEGATIVE
Nitrite: NEGATIVE
Protein, ur: NEGATIVE
Specific Gravity, Urine: 1.013 (ref 1.001–1.03)
pH: 6.5 (ref 5.0–8.0)

## 2019-12-21 NOTE — Telephone Encounter (Signed)
Updated patient on her labs  

## 2020-01-02 ENCOUNTER — Ambulatory Visit
Admission: RE | Admit: 2020-01-02 | Discharge: 2020-01-02 | Disposition: A | Discharge status: Home / Self Care | Payer: PPO | Source: Ambulatory Visit | Attending: Adult Health | Admitting: Adult Health

## 2020-01-02 DIAGNOSIS — I2510 Atherosclerotic heart disease of native coronary artery without angina pectoris: Secondary | ICD-10-CM | POA: Diagnosis not present

## 2020-01-02 DIAGNOSIS — J189 Pneumonia, unspecified organism: Secondary | ICD-10-CM

## 2020-01-02 DIAGNOSIS — R911 Solitary pulmonary nodule: Secondary | ICD-10-CM

## 2020-01-02 DIAGNOSIS — J439 Emphysema, unspecified: Secondary | ICD-10-CM | POA: Diagnosis not present

## 2020-01-02 DIAGNOSIS — J984 Other disorders of lung: Secondary | ICD-10-CM | POA: Diagnosis not present

## 2020-01-02 DIAGNOSIS — I700 Atherosclerosis of aorta: Secondary | ICD-10-CM | POA: Diagnosis not present

## 2020-01-02 MED ORDER — IOPAMIDOL (ISOVUE-300) INJECTION 61%
75.0000 mL | Freq: Once | INTRAVENOUS | Status: AC | PRN
  Administered 2020-01-02: 75 mL via INTRAVENOUS

## 2020-01-03 ENCOUNTER — Telehealth: Payer: Self-pay | Admitting: Adult Health

## 2020-01-03 MED ORDER — DOXYCYCLINE HYCLATE 100 MG PO CAPS: 100.0000 mg | ORAL_CAPSULE | Freq: Two times a day (BID) | ORAL | 0 refills | Status: AC

## 2020-01-03 NOTE — Telephone Encounter (Signed)
Pt is returning Cory's call and would like a call back.

## 2020-01-03 NOTE — Telephone Encounter (Signed)
Updated patient on her CT scan of chest which showed  Persistent density in the right upper lobe with central fluid attenuation and mild cavitation consistent with pneumonia/abscess. This corresponds with the patient's persistent elevated white blood cell count. Continued follow-up and treatment is recommended.   Does report that she is feeling better, continues to have some semiproductive cough at times but this has improved and her appetite is back.  Unfortunately she did not do well with Levaquin as this caused a lot of GI distress.  We will place her on doxycycline for 2 weeks with follow-up chest x-ray at that time.  She was advised to follow-up sooner if she develops fevers, chills, or not feeling well.  We may need to consider referral to pulmonary for drainage of abscess in the future

## 2020-01-18 ENCOUNTER — Ambulatory Visit (INDEPENDENT_AMBULATORY_CARE_PROVIDER_SITE_OTHER): Payer: PPO | Admitting: Adult Health

## 2020-01-18 ENCOUNTER — Other Ambulatory Visit: Payer: Self-pay

## 2020-01-18 ENCOUNTER — Encounter: Payer: Self-pay | Admitting: Adult Health

## 2020-01-18 ENCOUNTER — Ambulatory Visit (INDEPENDENT_AMBULATORY_CARE_PROVIDER_SITE_OTHER): Payer: PPO

## 2020-01-18 VITALS — BP 118/64 | HR 81 | Temp 97.7°F | Resp 12 | Ht <= 58 in | Wt 83.6 lb

## 2020-01-18 DIAGNOSIS — J189 Pneumonia, unspecified organism: Secondary | ICD-10-CM

## 2020-01-18 LAB — CBC WITH DIFFERENTIAL/PLATELET
Absolute Monocytes: 857 cells/uL (ref 200–950)
Basophils Absolute: 133 cells/uL (ref 0–200)
Basophils Relative: 1.3 %
Eosinophils Absolute: 102 cells/uL (ref 15–500)
Eosinophils Relative: 1 %
HCT: 39.7 % (ref 35.0–45.0)
Hemoglobin: 12.8 g/dL (ref 11.7–15.5)
Lymphs Abs: 3366 cells/uL (ref 850–3900)
MCH: 29.9 pg (ref 27.0–33.0)
MCHC: 32.2 g/dL (ref 32.0–36.0)
MCV: 92.8 fL (ref 80.0–100.0)
MPV: 10 fL (ref 7.5–12.5)
Monocytes Relative: 8.4 %
Neutro Abs: 5743 cells/uL (ref 1500–7800)
Neutrophils Relative %: 56.3 %
Platelets: 463 10*3/uL — ABNORMAL HIGH (ref 140–400)
RBC: 4.28 10*6/uL (ref 3.80–5.10)
RDW: 12.7 % (ref 11.0–15.0)
Total Lymphocyte: 33 %
WBC: 10.2 10*3/uL (ref 3.8–10.8)

## 2020-01-18 NOTE — Progress Notes (Signed)
Subjective:    Patient ID: Jennifer Donaldson, female    DOB: Sep 05, 1932, 86 y.o.   MRN: [id number]  HPI  86 year old female who  has a past medical history of C. difficile colitis, CAD (coronary artery disease), Dyslipidemia (12/24/2006), Hyperlipidemia, Mitral regurgitation, MITRAL REGURGITATION (08/12/2008), Osteopenia, Osteoporosis (03/01/2014), Raynaud's disease, RAYNAUD'S DISEASE (12/24/2006), and Special screening for malignant neoplasm of colon.  She presents to the office today for two week follow up   He was originally seen at urgent care on 12/09/2019 for a dry cough and loss of appetite.  She had a chest x-ray that showed infiltrate versus mass and she was started on Levaquin 750 mg daily x5 days and a prednisone taper.  When she followed up with me on 12/20/2019 she reported feeling better and had finished the antibiotics.  Due to concern of a mass CT of the chest was ordered which showed a persistent density in the right upper lobe with central fluid attenuation and mild cavitation consistent with pneumonia/abscess.  She was subsequently started on a 14-day course of doxycycline.  Day she reports that she does feel as though she has her energy back, she is no longer coughing, and her appetite is picking up.  Wt Readings from Last 3 Encounters:  01/18/20 83 lb 9.6 oz (37.9 kg)  12/20/19 83 lb (37.6 kg)  11/01/19 92 lb (41.7 kg)      Review of Systems See HPI   Past Medical History:  Diagnosis Date  . C. difficile colitis   . CAD (coronary artery disease)   . Dyslipidemia 12/24/2006   Qualifier: Diagnosis of  By: Scherrie Gerlach    . Hyperlipidemia   . Mitral regurgitation   . MITRAL REGURGITATION 08/12/2008   Qualifier: Diagnosis of  By: Burnett Kanaris    . Osteopenia   . Osteoporosis 03/01/2014  . Raynaud's disease   . RAYNAUD'S DISEASE 12/24/2006   Qualifier: Diagnosis of  By: Scherrie Gerlach    . Special screening for malignant neoplasm of colon     Social  History   Socioeconomic History  . Marital status: Married    Spouse name: Not on file  . Number of children: Not on file  . Years of education: Not on file  . Highest education level: Not on file  Occupational History  . Not on file  Tobacco Use  . Smoking status: Former Smoker    Packs/day: 0.50    Years: 20.00    Pack years: 10.00    Types: Cigarettes    Quit date: 09/01/1983    Years since quitting: 36.4  . Smokeless tobacco: Never Used  Vaping Use  . Vaping Use: Never used  Substance and Sexual Activity  . Alcohol use: No    Alcohol/week: 0.0 standard drinks  . Drug use: No  . Sexual activity: Yes    Birth control/protection: None    Comment: Married  Other Topics Concern  . 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 Difficulty of Paying Living Expenses: Not on file  Food Insecurity:   . Worried About Charity fundraiser in the Last Year: Not on file  . Ran Out of Food in the Last Year: Not  on file  Transportation Needs:   . Lack of Transportation (Medical): Not on file  . Lack of Transportation (Non-Medical): Not on file  Physical Activity:   . Days of Exercise per Week: Not on file  . Minutes of Exercise per Session: Not on file  Stress:   . Feeling of Stress : Not on file  Social Connections:   . Frequency of Communication with Friends and Family: Not on file  . Frequency of Social Gatherings with Friends and Family: Not on file  . Attends Religious Services: Not on file  . Active Member of Clubs or Organizations: Not on file  . Attends Archivist Meetings: Not on file  . Marital Status: Not on file  Intimate Partner Violence:   . Fear of Current or Ex-Partner: Not on file  . Emotionally Abused: Not on file  . Physically Abused: Not  on file  . Sexually Abused: Not on file    Past Surgical History:  Procedure Laterality Date  . BREAST EXCISIONAL BIOPSY Left   . CORONARY STENT PLACEMENT     2 prior drug eluting stent placed in the LAD and right coronary artery     Family History  Problem Relation Age of Onset  . CAD Brother   . Stroke Father   . CAD Mother   . Breast cancer Neg Hx     Allergies  Allergen Reactions  . Celexa [Citalopram]     Confusion    . Codeine Sulfate     REACTION: sensitivity to    Current Outpatient Medications on File Prior to Visit  Medication Sig Dispense Refill  . alendronate (FOSAMAX) 70 MG tablet Take 70 mg by mouth once a week.  11  . amLODipine (NORVASC) 2.5 MG tablet TAKE 1 TABLET (2.5 MG TOTAL) BY MOUTH DAILY. 90 tablet 1  . aspirin 81 MG tablet Take 81 mg by mouth daily.      Marland Kitchen atorvastatin (LIPITOR) 20 MG tablet Take 1 tablet (20 mg total) by mouth daily at 6 PM. 90 tablet 1  . benzonatate (TESSALON) 200 MG capsule Take 1 capsule (200 mg total) by mouth 2 (two) times daily as needed for cough. 20 capsule 1  . calcium citrate-vitamin D (CITRACAL+D) 315-200 MG-UNIT per tablet Take 2 tablets by mouth daily.     . Cholecalciferol (VITAMIN D3) 1000 UNITS CAPS Take 1 tablet by mouth daily.      Marland Kitchen doxycycline (VIBRAMYCIN) 50 MG capsule Take 50 mg oral daily as needed for Rosacea    . ezetimibe (ZETIA) 10 MG tablet TAKE 1 TABLET BY MOUTH EVERY DAY 90 tablet 2  . meloxicam (MOBIC) 15 MG tablet TAKE 1 (ONE) TABLET DAILY AS NEEDED FOR PAIN 90 tablet 1  . omeprazole (PRILOSEC) 20 MG capsule Take 1 capsule (20 mg total) by mouth daily. 30 capsule 1   No current facility-administered medications on file prior to visit.    BP 118/64 (BP Location: Left Arm, Patient Position: Sitting, Cuff Size: Large)   Pulse 81   Temp 97.7 F (36.5 C) (Oral)   Resp 12   Ht 4\' 10"  (1.473 m)   Wt 83 lb 9.6 oz (37.9 kg)   SpO2 97%   BMI 17.47 kg/m       Objective:   Physical Exam Vitals  and nursing note reviewed.  Constitutional:      Appearance: Normal appearance. She is underweight.  Cardiovascular:     Rate and Rhythm: Normal rate and regular  rhythm.     Heart sounds: Normal heart sounds.  Pulmonary:     Effort: Pulmonary effort is normal.     Breath sounds: Normal breath sounds.  Musculoskeletal:        General: Normal range of motion.  Skin:    General: Skin is warm and dry.     Capillary Refill: Capillary refill takes less than 2 seconds.  Neurological:     General: No focal deficit present.     Mental Status: She is alert and oriented to person, place, and time.  Psychiatric:        Mood and Affect: Mood normal.        Behavior: Behavior normal.        Thought Content: Thought content normal.        Judgment: Judgment normal.       Assessment & Plan:  1. Pneumonia of right lower lobe due to infectious organism - Consider referral to pulmonary if xray is still abnormal  - CBC with Differential/Platelet; Future - DG Chest 2 View; Future - CBC with Differential/Platelet   Dorothyann Peng, NP

## 2020-01-19 ENCOUNTER — Telehealth: Payer: Self-pay | Admitting: Adult Health

## 2020-01-19 DIAGNOSIS — J189 Pneumonia, unspecified organism: Secondary | ICD-10-CM

## 2020-01-19 NOTE — Telephone Encounter (Signed)
Spoke  to patient and informed her of her lab results and chest x-ray.  Her white blood cell count has come back to normal levels.  She has interval clearing on her chest x-ray of pneumonia, recommend repeating chest x-ray in 2 weeks.  We will place order for her to go to a low [facility] office in 2 weeks for repeat chest x-ray

## 2020-01-22 ENCOUNTER — Other Ambulatory Visit: Payer: Self-pay | Admitting: Internal Medicine

## 2020-02-01 ENCOUNTER — Other Ambulatory Visit: Payer: Self-pay

## 2020-02-01 ENCOUNTER — Ambulatory Visit (INDEPENDENT_AMBULATORY_CARE_PROVIDER_SITE_OTHER)
Admission: RE | Admit: 2020-02-01 | Discharge: 2020-02-01 | Disposition: A | Discharge status: Home / Self Care | Payer: PPO | Source: Ambulatory Visit | Attending: Adult Health | Admitting: Adult Health

## 2020-02-01 DIAGNOSIS — J189 Pneumonia, unspecified organism: Secondary | ICD-10-CM | POA: Diagnosis not present

## 2020-02-13 ENCOUNTER — Ambulatory Visit (INDEPENDENT_AMBULATORY_CARE_PROVIDER_SITE_OTHER): Payer: PPO

## 2020-02-13 ENCOUNTER — Other Ambulatory Visit: Payer: Self-pay

## 2020-02-13 DIAGNOSIS — Z0000 Encounter for general adult medical examination without abnormal findings: Secondary | ICD-10-CM

## 2020-02-13 NOTE — Progress Notes (Addendum)
Subjective:   Jennifer Donaldson is a 86 y.o. female who presents for an Initial Medicare Annual Wellness Visit.  I connected with Carlean Jews today by telephone and verified that I am speaking with the correct person using two identifiers. Location patient: home Location provider: work Persons participating in the virtual visit: patient, provider.   I discussed the limitations, risks, security and privacy concerns of performing an evaluation and management service by telephone and the availability of in person appointments. I also discussed with the patient that there may be a patient responsible charge related to this service. The patient expressed understanding and verbally consented to this telephonic visit.    Interactive audio and video telecommunications were attempted between this provider and patient, however failed, due to patient having technical difficulties OR patient did not have access to video capability.  We continued and completed visit with audio only.     Review of Systems    N/A  Cardiac Risk Factors include: advanced age (>78men, >12 women);dyslipidemia     Objective:    Today's Vitals   There is no height or weight on file to calculate BMI.  Advanced Directives 02/13/2020 07/17/2017  Does Patient Have a Medical Advance Directive? Yes No  Type of Paramedic of [place];Living will -  Does patient want to make changes to medical advance directive? No - Patient declined -  Copy of [place] in Chart? No - copy requested -    Current Medications (verified) Outpatient Encounter Medications as of 02/13/2020  Medication Sig  . alendronate (FOSAMAX) 70 MG tablet Take 70 mg by mouth once a week.  Marland Kitchen amLODipine (NORVASC) 2.5 MG tablet TAKE 1 TABLET (2.5 MG TOTAL) BY MOUTH DAILY.  Marland Kitchen atorvastatin (LIPITOR) 20 MG tablet TAKE 1 TABLET (20 MG TOTAL) BY MOUTH DAILY AT 6 PM.  . calcium citrate-vitamin D (CITRACAL+D)  315-200 MG-UNIT per tablet Take 2 tablets by mouth daily.   . Cholecalciferol (VITAMIN D3) 1000 UNITS CAPS Take 1 tablet by mouth daily.    Marland Kitchen ezetimibe (ZETIA) 10 MG tablet TAKE 1 TABLET BY MOUTH EVERY DAY  . meloxicam (MOBIC) 15 MG tablet TAKE 1 (ONE) TABLET DAILY AS NEEDED FOR PAIN  . aspirin 81 MG tablet Take 81 mg by mouth daily.   (Patient not taking: Reported on 02/13/2020)  . benzonatate (TESSALON) 200 MG capsule Take 1 capsule (200 mg total) by mouth 2 (two) times daily as needed for cough. (Patient not taking: Reported on 02/13/2020)  . doxycycline (VIBRAMYCIN) 50 MG capsule Take 50 mg oral daily as needed for Rosacea  . omeprazole (PRILOSEC) 20 MG capsule Take 1 capsule (20 mg total) by mouth daily. (Patient not taking: Reported on 02/13/2020)   No facility-administered encounter medications on file as of 02/13/2020.    Allergies (verified) Celexa [citalopram] and Codeine sulfate   History: Past Medical History:  Diagnosis Date  . C. difficile colitis   . CAD (coronary artery disease)   . Dyslipidemia 12/24/2006   Qualifier: Diagnosis of  By: Scherrie Gerlach    . Hyperlipidemia   . Mitral regurgitation   . MITRAL REGURGITATION 08/12/2008   Qualifier: Diagnosis of  By: Burnett Kanaris    . Osteopenia   . Osteoporosis 03/01/2014  . Raynaud's disease   . RAYNAUD'S DISEASE 12/24/2006   Qualifier: Diagnosis of  By: Scherrie Gerlach    . Special screening for malignant neoplasm of colon    Past Surgical History:  Procedure Laterality Date  . BREAST EXCISIONAL BIOPSY Left   . CORONARY STENT PLACEMENT     2 prior drug eluting stent placed in the LAD and right coronary artery    Family History  Problem Relation Age of Onset  . CAD Brother   . Stroke Father   . CAD Mother   . Breast cancer Neg Hx    Social History   Socioeconomic History  . Marital status: Married    Spouse name: Not on file  . Number of children: Not on file  . Years of education: Not on file  . Highest  education level: Not on file  Occupational History  . Not on file  Tobacco Use  . Smoking status: Former Smoker    Packs/day: 0.50    Years: 20.00    Pack years: 10.00    Types: Cigarettes    Quit date: 09/01/1983    Years since quitting: 36.4  . Smokeless tobacco: Never Used  Vaping Use  . Vaping Use: Never used  Substance and Sexual Activity  . Alcohol use: No    Alcohol/week: 0.0 standard drinks  . Drug use: No  . Sexual activity: Yes    Birth control/protection: None    Comment: Married  Other Topics Concern  . Not on file  Social History Narrative   Retired Engineer, structural   Married for 39 years - husband has multiple medical issues and is disabled   4 children   Grew up in [place] Strain: [place]   . Difficulty of Paying Living Expenses: Not hard at all  Food Insecurity: No Food Insecurity  . Worried About Charity fundraiser in the Last Year: Never true  . Ran Out of Food in the Last Year: Never true  Transportation Needs: No Transportation Needs  . Lack of Transportation (Medical): No  . Lack of Transportation (Non-Medical): No  Physical Activity: Inactive  . Days of Exercise per Week: 0 days  . Minutes of Exercise per Session: 0 min  Stress: No Stress Concern Present  . Feeling of Stress : Not at all  Social Connections: Moderately Integrated  . Frequency of Communication with Friends and Family: More than three times a week  . Frequency of Social Gatherings with Friends and Family: More than three times a week  . Attends Religious Services: More than 4 times per year  . Active Member of Clubs or Organizations: No  . Attends Archivist Meetings: Never  . Marital Status: Married    Tobacco Counseling Counseling given: Not Answered   Clinical Intake:  Pre-visit preparation completed: Yes  Pain : No/denies pain     Nutritional Risks: None Diabetes: No  How often do  you need to have someone help you when you read instructions, pamphlets, or other written materials from your doctor or pharmacy?: 1 - Never What is the last grade level you completed in school?: 11th Grade  Diabetic?No  Interpreter Needed?: No  Information entered by :: [place] of Daily Living In your present state of health, do you have any difficulty performing the following activities: 02/13/2020 01/18/2020  Hearing? N N  Vision? N N  Difficulty concentrating or making decisions? Y N  Comment Forgets names sometimes, and names of streets -  Walking or climbing stairs? Y N  Comment has some issues with climbing stairs due to loss of muscle strength -  Dressing or bathing? N N  Doing errands, shopping? N N  Comment has daughters shop for her -  Conservation officer, nature and eating ? N -  Using the Toilet? N -  In the past six months, have you accidently leaked urine? Y -  Comment Has some issues with bladder leakage and has to wear pads -  Do you have problems with loss of bowel control? N -  Managing your Medications? N -  Managing your Finances? N -  Housekeeping or managing your Housekeeping? N -  Some recent data might be hidden    Patient Care Team: Dorothyann Peng, NP as PCP - General (Family Medicine) Fay Records, MD as PCP - Cardiology (Cardiology) Earnie Larsson, [facility] (Pharmacist)  Indicate any recent Medical Services you may have received from other than [facility] providers in the past year (date may be approximate).     Assessment:   This is a routine wellness examination for [place].  Hearing/Vision screen  Hearing Screening   125Hz  250Hz  500Hz  1000Hz  2000Hz  3000Hz  4000Hz  6000Hz  8000Hz   Right ear:           Left ear:           Vision Screening Comments: Patient states gets eyes checked once per year   Dietary issues and exercise activities discussed: Current Exercise Habits: The patient does not participate in regular exercise at present, Exercise  limited by: None identified  Goals    . Exercise 150 min/wk Moderate Activity     Go to the [facility] center more often if you can get some relief  Get help with organized       Depression Screen PHQ 2/9 Scores 02/13/2020 01/18/2020 11/05/2017 07/17/2017 08/02/2013  PHQ - 2 Score 0 0 0 1 0  PHQ- 9 Score 0 - - - -    Fall Risk Fall Risk  02/13/2020 03/09/2019 11/05/2017 07/17/2017 08/02/2013  Falls in the past year? 0 0 No No No  Comment - Emmi Telephone Survey: data to providers prior to load - - -  Number falls in past yr: 0 - - - -  Injury with Fall? 0 - - - -  Risk for fall due to : No Fall Risks - - - -  Follow up Falls evaluation completed;Falls prevention discussed - - - -    Any stairs in or around the home? No  If so, are there any without handrails? No  Home free of loose throw rugs in walkways, pet beds, electrical cords, etc? Yes  Adequate lighting in your home to reduce risk of falls? Yes   ASSISTIVE DEVICES UTILIZED TO PREVENT FALLS:  Life alert? No  Use of a cane, walker or w/c? No  Grab bars in the bathroom? Yes  Shower chair or bench in shower? Yes  Elevated toilet seat or a handicapped toilet? No     Cognitive Function: MMSE - Mini Mental State Exam 07/17/2017  Not completed: (No Data)     6CIT Screen 02/13/2020  What Year? 0 points  What month? 0 points  What time? 0 points  Count back from 20 0 points  Months in reverse 4 points  Repeat phrase 6 points  Total Score 10    Immunizations Immunization History  Administered Date(s) Administered  . Fluad Quad(high Dose 65+) 01/17/2019  . Influenza Split 01/22/2011, 02/11/2012  . Influenza Whole 01/26/2008, 02/06/2009, 01/04/2010  . Influenza, High Dose Seasonal PF 12/29/2014, 02/06/2016, 02/04/2017, 02/09/2018  . Influenza,inj,Quad PF,6+ Mos 12/14/2012, 01/17/2014  .  Moderna SARS-COVID-2 Vaccination 04/15/2019, 05/16/2019  . Pneumococcal Conjugate-13 01/17/2014  . Pneumococcal Polysaccharide-23 12/06/1999   . Td 04/14/1998, 09/20/2008  . Zoster 04/15/2007    TDAP status: Due, Education has been provided regarding the importance of this vaccine. Advised may receive this vaccine at local pharmacy or Health Dept. Aware to provide a copy of the vaccination record if obtained from local pharmacy or Health Dept. Verbalized acceptance and understanding. Flu Vaccine status: Declined, Education has been provided regarding the importance of this vaccine but patient still declined. Advised may receive this vaccine at local pharmacy or Health Dept. Aware to provide a copy of the vaccination record if obtained from local pharmacy or Health Dept. Verbalized acceptance and understanding. Pneumococcal vaccine status: Up to date Covid-19 vaccine status: Completed vaccines  Qualifies for Shingles Vaccine? Yes   Zostavax completed Yes   Shingrix Completed?: No.    Education has been provided regarding the importance of this vaccine. Patient has been advised to call insurance company to determine out of pocket expense if they have not yet received this vaccine. Advised may also receive vaccine at local pharmacy or Health Dept. Verbalized acceptance and understanding.  Screening Tests Health Maintenance  Topic Date Due  . TETANUS/TDAP  09/21/2018  . INFLUENZA VACCINE  03/07/2020 (Originally 11/13/2019)  . DEXA SCAN  Completed  . COVID-19 Vaccine  Completed  . PNA vac Low Risk Adult  Completed    Health Maintenance  Health Maintenance Due  Topic Date Due  . TETANUS/TDAP  09/21/2018    Colorectal cancer screening: No longer required.  Mammogram status: No longer required.  Bone Density status: Completed 08/16/2019. Results reflect: Bone density results: OSTEOPENIA. Repeat every 5 years.  Lung Cancer Screening: (Low Dose CT Chest recommended if Age 71-80 years, 30 pack-year currently smoking OR have quit w/in 15years.) does not qualify.   Lung Cancer Screening Referral: N/A   Additional  Screening:  Hepatitis C Screening: does not qualify;   Vision Screening: Recommended annual ophthalmology exams for early detection of glaucoma and other disorders of the eye. Is the patient up to date with their annual eye exam?  Yes  Who is the provider or what is the name of the office in which the patient attends annual eye exams? Dr. Bing Plume  If pt is not established with a provider, would they like to be referred to a provider to establish care? No .   Dental Screening: Recommended annual dental exams for proper oral hygiene  Community Resource Referral / Chronic Care Management: CRR required this visit?  No   CCM required this visit?  No      Plan:     I have personally reviewed and noted the following in the patient's chart:   . Medical and social history . Use of alcohol, tobacco or illicit drugs  . Current medications and supplements . Functional ability and status . Nutritional status . Physical activity . Advanced directives . List of other physicians . Hospitalizations, surgeries, and ER visits in previous 12 months . Vitals . Screenings to include cognitive, depression, and falls . Referrals and appointments  In addition, I have reviewed and discussed with patient certain preventive protocols, quality metrics, and best practice recommendations. A written personalized care plan for preventive services as well as general preventive health recommendations were provided to patient.     Ofilia Neas, LPN   26/12/4852   Nurse Notes: None

## 2020-02-13 NOTE — Patient Instructions (Signed)
Jennifer Donaldson , Thank you for taking time to come for your Medicare Wellness Visit. I appreciate your ongoing commitment to your health goals. Please review the following plan we discussed and let me know if I can assist you in the future.   Screening recommendations/referrals: Colonoscopy: No longer required Mammogram: No longer required  Bone Density: Next due 08/15/2024 Recommended yearly ophthalmology/optometry visit for glaucoma screening and checkup Recommended yearly dental visit for hygiene and checkup  Vaccinations: Influenza vaccine: Currently due, you may receive at our office or at your pharmacy. Pneumococcal vaccine: Completed series Tdap vaccine: Currently due, you may receive at our office or at your pharmacy  Shingles vaccine: Currently due for Shingrix, if you wish to receive you may receive at your pharmacy.    Advanced directives: Please bring a copy of your advanced medical directives into our office so that we may scan it into your chart.   Conditions/risks identified: None   Next appointment: None   Preventive Care 65 Years and Older, Female Preventive care refers to lifestyle choices and visits with your health care provider that can promote health and wellness. What does preventive care include?  A yearly physical exam. This is also called an annual well check.  Dental exams once or twice a year.  Routine eye exams. Ask your health care provider how often you should have your eyes checked.  Personal lifestyle choices, including:  Daily care of your teeth and gums.  Regular physical activity.  Eating a healthy diet.  Avoiding tobacco and drug use.  Limiting alcohol use.  Practicing safe sex.  Taking low-dose aspirin every day.  Taking vitamin and mineral supplements as recommended by your health care provider. What happens during an annual well check? The services and screenings done by your health care provider during your annual well check  will depend on your age, overall health, lifestyle risk factors, and family history of disease. Counseling  Your health care provider may ask you questions about your:  Alcohol use.  Tobacco use.  Drug use.  Emotional well-being.  Home and relationship well-being.  Sexual activity.  Eating habits.  History of falls.  Memory and ability to understand (cognition).  Work and work Statistician.  Reproductive health. Screening  You may have the following tests or measurements:  Height, weight, and BMI.  Blood pressure.  Lipid and cholesterol levels. These may be checked every 5 years, or more frequently if you are over 76 years old.  Skin check.  Lung cancer screening. You may have this screening every year starting at age 23 if you have a 30-pack-year history of smoking and currently smoke or have quit within the past 15 years.  Fecal occult blood test (FOBT) of the stool. You may have this test every year starting at age 90.  Flexible sigmoidoscopy or colonoscopy. You may have a sigmoidoscopy every 5 years or a colonoscopy every 10 years starting at age 85.  Hepatitis C blood test.  Hepatitis B blood test.  Sexually transmitted disease (STD) testing.  Diabetes screening. This is done by checking your blood sugar (glucose) after you have not eaten for a while (fasting). You may have this done every 1-3 years.  Bone density scan. This is done to screen for osteoporosis. You may have this done starting at age 26.  Mammogram. This may be done every 1-2 years. Talk to your health care provider about how often you should have regular mammograms. Talk with your health care provider about your  test results, treatment options, and if necessary, the need for more tests. Vaccines  Your health care provider may recommend certain vaccines, such as:  Influenza vaccine. This is recommended every year.  Tetanus, diphtheria, and acellular pertussis (Tdap, Td) vaccine. You may  need a Td booster every 10 years.  Zoster vaccine. You may need this after age 1.  Pneumococcal 13-valent conjugate (PCV13) vaccine. One dose is recommended after age 75.  Pneumococcal polysaccharide (PPSV23) vaccine. One dose is recommended after age 81. Talk to your health care provider about which screenings and vaccines you need and how often you need them. This information is not intended to replace advice given to you by your health care provider. Make sure you discuss any questions you have with your health care provider. Document Released: 04/27/2015 Document Revised: 12/19/2015 Document Reviewed: 01/30/2015 Elsevier Interactive Patient Education  2017 [employer] Prevention in the Home Falls can cause injuries. They can happen to people of all ages. There are many things you can do to make your home safe and to help prevent falls. What can I do on the outside of my home?  Regularly fix the edges of walkways and driveways and fix any cracks.  Remove anything that might make you trip as you walk through a door, such as a raised step or threshold.  Trim any bushes or trees on the path to your home.  Use bright outdoor lighting.  Clear any walking paths of anything that might make someone trip, such as rocks or tools.  Regularly check to see if handrails are loose or broken. Make sure that both sides of any steps have handrails.  Any raised decks and porches should have guardrails on the edges.  Have any leaves, snow, or ice cleared regularly.  Use sand or salt on walking paths during winter.  Clean up any spills in your garage right away. This includes oil or grease spills. What can I do in the bathroom?  Use night lights.  Install grab bars by the toilet and in the tub and shower. Do not use towel bars as grab bars.  Use non-skid mats or decals in the tub or shower.  If you need to sit down in the shower, use a plastic, non-slip stool.  Keep the floor  dry. Clean up any water that spills on the floor as soon as it happens.  Remove soap buildup in the tub or shower regularly.  Attach bath mats securely with double-sided non-slip rug tape.  Do not have throw rugs and other things on the floor that can make you trip. What can I do in the bedroom?  Use night lights.  Make sure that you have a light by your bed that is easy to reach.  Do not use any sheets or blankets that are too big for your bed. They should not hang down onto the floor.  Have a firm chair that has side arms. You can use this for support while you get dressed.  Do not have throw rugs and other things on the floor that can make you trip. What can I do in the kitchen?  Clean up any spills right away.  Avoid walking on wet floors.  Keep items that you use a lot in easy-to-reach places.  If you need to reach something above you, use a strong step stool that has a grab bar.  Keep electrical cords out of the way.  Do not use floor polish or wax that  makes floors slippery. If you must use wax, use non-skid floor wax.  Do not have throw rugs and other things on the floor that can make you trip. What can I do with my stairs?  Do not leave any items on the stairs.  Make sure that there are handrails on both sides of the stairs and use them. Fix handrails that are broken or loose. Make sure that handrails are as long as the stairways.  Check any carpeting to make sure that it is firmly attached to the stairs. Fix any carpet that is loose or worn.  Avoid having throw rugs at the top or bottom of the stairs. If you do have throw rugs, attach them to the floor with carpet tape.  Make sure that you have a light switch at the top of the stairs and the bottom of the stairs. If you do not have them, ask someone to add them for you. What else can I do to help prevent falls?  Wear shoes that:  Do not have high heels.  Have rubber bottoms.  Are comfortable and fit you  well.  Are closed at the toe. Do not wear sandals.  If you use a stepladder:  Make sure that it is fully opened. Do not climb a closed stepladder.  Make sure that both sides of the stepladder are locked into place.  Ask someone to hold it for you, if possible.  Clearly mark and make sure that you can see:  Any grab bars or handrails.  First and last steps.  Where the edge of each step is.  Use tools that help you move around (mobility aids) if they are needed. These include:  Canes.  Walkers.  Scooters.  Crutches.  Turn on the lights when you go into a dark area. Replace any light bulbs as soon as they burn out.  Set up your furniture so you have a clear path. Avoid moving your furniture around.  If any of your floors are uneven, fix them.  If there are any pets around you, be aware of where they are.  Review your medicines with your doctor. Some medicines can make you feel dizzy. This can increase your chance of falling. Ask your doctor what other things that you can do to help prevent falls. This information is not intended to replace advice given to you by your health care provider. Make sure you discuss any questions you have with your health care provider. Document Released: 01/25/2009 Document Revised: 09/06/2015 Document Reviewed: 05/05/2014 Elsevier Interactive Patient Education  2017 [employer].

## 2020-03-12 DIAGNOSIS — L718 Other rosacea: Secondary | ICD-10-CM | POA: Diagnosis not present

## 2020-03-12 DIAGNOSIS — L218 Other seborrheic dermatitis: Secondary | ICD-10-CM | POA: Diagnosis not present

## 2020-05-01 ENCOUNTER — Other Ambulatory Visit: Payer: Self-pay | Admitting: Internal Medicine

## 2020-06-25 ENCOUNTER — Other Ambulatory Visit: Payer: Self-pay | Admitting: Adult Health

## 2020-07-10 ENCOUNTER — Other Ambulatory Visit: Payer: Self-pay | Admitting: Adult Health

## 2020-07-10 DIAGNOSIS — M159 Polyosteoarthritis, unspecified: Secondary | ICD-10-CM

## 2020-07-10 DIAGNOSIS — M8949 Other hypertrophic osteoarthropathy, multiple sites: Secondary | ICD-10-CM

## 2020-07-16 DIAGNOSIS — L218 Other seborrheic dermatitis: Secondary | ICD-10-CM | POA: Diagnosis not present

## 2020-07-16 DIAGNOSIS — L718 Other rosacea: Secondary | ICD-10-CM | POA: Diagnosis not present

## 2020-07-19 ENCOUNTER — Other Ambulatory Visit: Payer: Self-pay | Admitting: Adult Health

## 2020-07-19 DIAGNOSIS — M8949 Other hypertrophic osteoarthropathy, multiple sites: Secondary | ICD-10-CM

## 2020-07-19 DIAGNOSIS — M159 Polyosteoarthritis, unspecified: Secondary | ICD-10-CM

## 2020-09-24 DIAGNOSIS — Z01419 Encounter for gynecological examination (general) (routine) without abnormal findings: Secondary | ICD-10-CM | POA: Diagnosis not present

## 2020-09-27 ENCOUNTER — Other Ambulatory Visit: Payer: Self-pay | Admitting: Obstetrics and Gynecology

## 2020-09-27 DIAGNOSIS — Z1231 Encounter for screening mammogram for malignant neoplasm of breast: Secondary | ICD-10-CM

## 2020-11-15 ENCOUNTER — Other Ambulatory Visit: Payer: Self-pay | Admitting: Adult Health

## 2020-11-15 DIAGNOSIS — M8949 Other hypertrophic osteoarthropathy, multiple sites: Secondary | ICD-10-CM

## 2020-11-15 DIAGNOSIS — M159 Polyosteoarthritis, unspecified: Secondary | ICD-10-CM

## 2020-11-21 ENCOUNTER — Other Ambulatory Visit: Payer: Self-pay | Admitting: Obstetrics and Gynecology

## 2020-11-21 DIAGNOSIS — Z1231 Encounter for screening mammogram for malignant neoplasm of breast: Secondary | ICD-10-CM

## 2020-12-07 ENCOUNTER — Ambulatory Visit (INDEPENDENT_AMBULATORY_CARE_PROVIDER_SITE_OTHER): Payer: PPO | Admitting: Adult Health

## 2020-12-07 ENCOUNTER — Other Ambulatory Visit: Payer: Self-pay

## 2020-12-07 ENCOUNTER — Encounter: Payer: Self-pay | Admitting: Adult Health

## 2020-12-07 VITALS — BP 126/78 | HR 70 | Temp 97.9°F | Ht 58.5 in | Wt 95.0 lb

## 2020-12-07 DIAGNOSIS — F419 Anxiety disorder, unspecified: Secondary | ICD-10-CM

## 2020-12-07 DIAGNOSIS — F32A Depression, unspecified: Secondary | ICD-10-CM | POA: Diagnosis not present

## 2020-12-07 DIAGNOSIS — Z0000 Encounter for general adult medical examination without abnormal findings: Secondary | ICD-10-CM | POA: Diagnosis not present

## 2020-12-07 DIAGNOSIS — M8949 Other hypertrophic osteoarthropathy, multiple sites: Secondary | ICD-10-CM | POA: Diagnosis not present

## 2020-12-07 DIAGNOSIS — G8929 Other chronic pain: Secondary | ICD-10-CM

## 2020-12-07 DIAGNOSIS — M810 Age-related osteoporosis without current pathological fracture: Secondary | ICD-10-CM

## 2020-12-07 DIAGNOSIS — I2583 Coronary atherosclerosis due to lipid rich plaque: Secondary | ICD-10-CM

## 2020-12-07 DIAGNOSIS — E785 Hyperlipidemia, unspecified: Secondary | ICD-10-CM | POA: Diagnosis not present

## 2020-12-07 DIAGNOSIS — I2510 Atherosclerotic heart disease of native coronary artery without angina pectoris: Secondary | ICD-10-CM

## 2020-12-07 DIAGNOSIS — M545 Low back pain, unspecified: Secondary | ICD-10-CM

## 2020-12-07 DIAGNOSIS — M159 Polyosteoarthritis, unspecified: Secondary | ICD-10-CM

## 2020-12-07 LAB — LIPID PANEL
Cholesterol: 165 mg/dL (ref 0–200)
HDL: 82.2 mg/dL (ref >39.00)
LDL Cholesterol: 68 mg/dL (ref 0–99)
NonHDL: 83.05
Total CHOL/HDL Ratio: 2
Triglycerides: 76 mg/dL (ref 0.0–149.0)
VLDL: 15.2 mg/dL (ref 0.0–40.0)

## 2020-12-07 LAB — COMPREHENSIVE METABOLIC PANEL
ALT: 14 U/L (ref 0–35)
AST: 28 U/L (ref 0–37)
Albumin: 4.5 g/dL (ref 3.5–5.2)
Alkaline Phosphatase: 50 U/L (ref 39–117)
BUN: 21 mg/dL (ref 6–23)
CO2: 29 mEq/L (ref 19–32)
Calcium: 10.8 mg/dL — ABNORMAL HIGH (ref 8.4–10.5)
Chloride: 100 mEq/L (ref 96–112)
Creatinine, Ser: 1 mg/dL (ref 0.40–1.20)
GFR: 51.04 mL/min — ABNORMAL LOW (ref >60.00)
Glucose, Bld: 88 mg/dL (ref 70–99)
Potassium: 4.8 mEq/L (ref 3.5–5.1)
Sodium: 139 mEq/L (ref 135–145)
Total Bilirubin: 0.8 mg/dL (ref 0.2–1.2)
Total Protein: 7.4 g/dL (ref 6.0–8.3)

## 2020-12-07 LAB — CBC WITH DIFFERENTIAL/PLATELET
Basophils Absolute: 0.2 10*3/uL — ABNORMAL HIGH (ref 0.0–0.1)
Basophils Relative: 2 % (ref 0.0–3.0)
Eosinophils Absolute: 0.2 10*3/uL (ref 0.0–0.7)
Eosinophils Relative: 3 % (ref 0.0–5.0)
HCT: 38.3 % (ref 36.0–46.0)
Hemoglobin: 12.6 g/dL (ref 12.0–15.0)
Lymphocytes Relative: 26.2 % (ref 12.0–46.0)
Lymphs Abs: 2.1 10*3/uL (ref 0.7–4.0)
MCHC: 33 g/dL (ref 30.0–36.0)
MCV: 91.7 fl (ref 78.0–100.0)
Monocytes Absolute: 0.8 10*3/uL (ref 0.1–1.0)
Monocytes Relative: 9.7 % (ref 3.0–12.0)
Neutro Abs: 4.7 10*3/uL (ref 1.4–7.7)
Neutrophils Relative %: 59.1 % (ref 43.0–77.0)
Platelets: 377 10*3/uL (ref 150.0–400.0)
RBC: 4.17 Mil/uL (ref 3.87–5.11)
RDW: 15.1 % (ref 11.5–15.5)
WBC: 8 10*3/uL (ref 4.0–10.5)

## 2020-12-07 LAB — TSH: TSH: 1.91 u[IU]/mL (ref 0.35–5.50)

## 2020-12-07 MED ORDER — ESCITALOPRAM OXALATE 5 MG PO TABS: 5.0000 mg | ORAL_TABLET | Freq: Every day | ORAL | 1 refills | Status: DC

## 2020-12-07 NOTE — Patient Instructions (Addendum)
It was great seeing you today   I have sent in a prescription called Lexapro, take this every day - it will help with anxiety   I would like to see you back in 30 days to see how you are doing   We will follow up with you regarding your blood work

## 2020-12-07 NOTE — Progress Notes (Signed)
Subjective:    Patient ID: EMMILINE BLAN, female    DOB: May 24, 1932, 87 y.o.   MRN: [id number]  HPI Patient presents for yearly preventative medicine examination. Sheis a pleasant and remarkable 87 year old female who  has a past medical history of C. difficile colitis, CAD (coronary artery disease), Dyslipidemia (12/24/2006), Hyperlipidemia, Mitral regurgitation, MITRAL REGURGITATION (08/12/2008), Osteopenia, Osteoporosis (03/01/2014), Raynaud's disease, RAYNAUD'S DISEASE (12/24/2006), and Special screening for malignant neoplasm of colon.   Essential Hypertension -controlled with Norvasc 2.5 mg daily.  She denies dizziness, lightheadedness, chest pain, or shortness of breath BP Readings from Last 3 Encounters:  12/07/20 126/78  01/18/20 118/64  12/20/19 136/70   Hyperlipidemia/CAD -status post PCI/DES to LAD and RCA in 2013.  She is currently prescribed Lipitor 20 mg daily, Zetia 10 mg daily, and aspirin 81 mg daily.  She denies myalgia, fatigue, chest pain, shortness of breath Lab Results  Component Value Date   CHOL 171 06/22/2019   HDL 84.00 06/22/2019   LDLCALC 75 06/22/2019   LDLDIRECT 124.7 07/20/2012   TRIG 61.0 06/22/2019   CHOLHDL 2 06/22/2019   Osteoporosis -prescribed Fosamax 70 g weekly by GYN.  She is also on a vitamin D supplement  Osteoarthritis - takes Mobic PRN. She only takes it when her low back pain becomes " really bad". She would like to have an xray to see how bad the arthritis has become in her back. 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  She reports that she constantly feels stressed out, anxious, and is not able to get a full night sleep as she often wakes up in the middle of the night with racing thoughts.  Not necessarily depressed, but her husband will say mean things to her.  She knows that is because of the dementia but it still hurtful to her.  She does have a caregiver  coming in 3 days a week to help out around the house.  In the past we have tried Celexa but she ended up stopping this medication as she did not like the way it made her feel.  All immunizations and health maintenance protocols were reviewed with the patient and needed orders were placed.  Appropriate screening laboratory values were ordered for the patient including screening of hyperlipidemia, renal function and hepatic function.   Medication reconciliation,  past medical history, social history, problem list and allergies were reviewed in detail with the patient  Goals were established with regard to weight loss, exercise, and  diet in compliance with medications  Wt Readings from Last 3 Encounters:  12/07/20 95 lb (43.1 kg)  01/18/20 83 lb 9.6 oz (37.9 kg)  12/20/19 83 lb (37.6 kg)   Review of Systems  Constitutional: Negative.   HENT:  Positive for hearing loss.   Eyes: Negative.   Respiratory: Negative.    Cardiovascular: Negative.   Gastrointestinal: Negative.   Endocrine: Negative.   Genitourinary: Negative.   Musculoskeletal:  Positive for arthralgias and back pain.  Skin: Negative.   Allergic/Immunologic: Negative.   Neurological: Negative.   Hematological: Negative.   Psychiatric/Behavioral:  Positive for sleep disturbance. The patient is nervous/anxious.    Past Medical History:  Diagnosis Date   C. difficile colitis    CAD (coronary artery disease)    Dyslipidemia 12/24/2006   Qualifier: Diagnosis of  By: Sherren Mocha RN, Dorian Pod     Hyperlipidemia    Mitral regurgitation  MITRAL REGURGITATION 08/12/2008   Qualifier: Diagnosis of  By: Burnett Kanaris     Osteopenia    Osteoporosis 03/01/2014   Raynaud's disease    RAYNAUD'S DISEASE 12/24/2006   Qualifier: Diagnosis of  By: Sherren Mocha RN, Dorian Pod     Special screening for malignant neoplasm of colon     Social History   Socioeconomic History   Marital status: Married    Spouse name: Not on file   Number of children:  Not on file   Years of education: Not on file   Highest education level: Not on file  Occupational History   Not on file  Tobacco Use   Smoking status: Former    Packs/day: 0.50    Years: 20.00    Pack years: 10.00    Types: Cigarettes    Quit date: 09/01/1983    Years since quitting: 37.2   Smokeless tobacco: Never  Vaping Use   Vaping Use: Never used  Substance and Sexual Activity   Alcohol use: No    Alcohol/week: 0.0 standard drinks   Drug use: No   Sexual activity: Yes    Birth control/protection: None    Comment: Married  Other Topics Concern   Not on file  Social History Narrative   Retired Engineer, structural   Married for 24 years - husband has multiple medical issues and is disabled   4 children   Grew up in [place] Strain: Low Risk    Difficulty of Paying Living Expenses: Not hard at all  Food Insecurity: No Food Insecurity   Worried About Charity fundraiser in the Last Year: Never true   Arboriculturist in the Last Year: Never true  Transportation Needs: No Transportation Needs   Lack of Transportation (Medical): No   Lack of Transportation (Non-Medical): No  Physical Activity: Inactive   Days of Exercise per Week: 0 days   Minutes of Exercise per Session: 0 min  Stress: No Stress Concern Present   Feeling of Stress : Not at all  Social Connections: Moderately Integrated   Frequency of Communication with Friends and Family: More than three times a week   Frequency of Social Gatherings with Friends and Family: More than three times a week   Attends Religious Services: More than 4 times per year   Active Member of [employer] or Organizations: No   Attends Music therapist: Never   Marital Status: Married  Human resources officer Violence: Not At Risk   Fear of Current or Ex-Partner: No   Emotionally Abused: No   Physically Abused: No   Sexually Abused: No    Past Surgical History:   Procedure Laterality Date   BREAST EXCISIONAL BIOPSY Left    CORONARY STENT PLACEMENT     2 prior drug eluting stent placed in the LAD and right coronary artery     Family History  Problem Relation Age of Onset   CAD Brother    Stroke Father    CAD Mother    Breast cancer Neg Hx     Allergies  Allergen Reactions   Celexa [Citalopram]     Confusion     Codeine Sulfate     REACTION: sensitivity to    Current Outpatient Medications on File Prior to Visit  Medication Sig Dispense Refill   alendronate (FOSAMAX) 70 MG tablet Take 70 mg by mouth once a week.  11   amLODipine (  NORVASC) 2.5 MG tablet TAKE 1 TABLET (2.5 MG TOTAL) BY MOUTH DAILY. 90 tablet 1   aspirin 81 MG tablet Take 81 mg by mouth daily.     atorvastatin (LIPITOR) 20 MG tablet TAKE 1 TABLET (20 MG TOTAL) BY MOUTH DAILY AT 6 PM. 90 tablet 2   calcium citrate-vitamin D (CITRACAL+D) 315-200 MG-UNIT per tablet Take 2 tablets by mouth daily.      Cholecalciferol (VITAMIN D3) 1000 UNITS CAPS Take 1 tablet by mouth daily.       ezetimibe (ZETIA) 10 MG tablet TAKE 1 TABLET BY MOUTH EVERY DAY 90 tablet 2   ketoconazole (NIZORAL) 2 % shampoo Apply topically 2 (two) times a week.     meloxicam (MOBIC) 15 MG tablet TAKE 1 (ONE) TABLET DAILY AS NEEDED FOR PAIN 90 tablet 0   metroNIDAZOLE (METROCREAM) 0.75 % cream Apply topically 2 (two) times daily.     omeprazole (PRILOSEC) 20 MG capsule Take 1 capsule (20 mg total) by mouth daily. 30 capsule 1   doxycycline (VIBRAMYCIN) 50 MG capsule Take 50 mg oral daily as needed for Rosacea (Patient not taking: Reported on 12/07/2020)     SHINGRIX injection      No current facility-administered medications on file prior to visit.    BP 126/78   Pulse 70   Temp 97.9 F (36.6 C) (Oral)   Ht 4' 10.5" (1.486 m)   Wt 95 lb (43.1 kg)   SpO2 95%   BMI 19.52 kg/m       Objective:   Physical Exam Vitals and nursing note reviewed.  Constitutional:      Appearance: Normal appearance.   HENT:     Head: Normocephalic and atraumatic.     Right Ear: Tympanic membrane, ear canal and external ear normal. There is no impacted cerumen.     Left Ear: Tympanic membrane, ear canal and external ear normal. There is no impacted cerumen.     Nose: Nose normal. No congestion or rhinorrhea.     Mouth/Throat:     Mouth: Mucous membranes are moist.     Pharynx: Oropharynx is clear.  Eyes:     Extraocular Movements: Extraocular movements intact.     Pupils: Pupils are equal, round, and reactive to light.  Cardiovascular:     Rate and Rhythm: Normal rate and regular rhythm.     Pulses: Normal pulses.     Heart sounds: Normal heart sounds.  Pulmonary:     Effort: Pulmonary effort is normal.     Breath sounds: Normal breath sounds.  Abdominal:     General: Abdomen is flat. Bowel sounds are normal.     Palpations: Abdomen is soft.  Musculoskeletal:        General: Normal range of motion.  Skin:    General: Skin is warm and dry.     Capillary Refill: Capillary refill takes less than 2 seconds.  Neurological:     General: No focal deficit present.     Mental Status: She is alert and oriented to person, place, and time.  Psychiatric:        Mood and Affect: Mood normal.        Behavior: Behavior normal.        Thought Content: Thought content normal.        Judgment: Judgment normal.       Assessment & Plan:  1. Routine general medical examination at a health care facility  - CBC with Differential/Platelet; Future - Comprehensive  metabolic panel; Future - TSH; Future - Lipid panel; Future - Lipid panel - TSH - Comprehensive metabolic panel - CBC with Differential/Platelet  2. Dyslipidemia - Continue statin and zetia  - CBC with Differential/Platelet; Future - Comprehensive metabolic panel; Future - TSH; Future - Lipid panel; Future - Lipid panel - TSH - Comprehensive metabolic panel - CBC with Differential/Platelet  3. Coronary artery disease due to lipid rich  plaque - Follow up with cardiology  - Contine statin and zetia - CBC with Differential/Platelet; Future - Comprehensive metabolic panel; Future - TSH; Future - Lipid panel; Future - Lipid panel - TSH - Comprehensive metabolic panel - CBC with Differential/Platelet  4. Primary osteoarthritis involving multiple joints - Continue mobic pRN - CBC with Differential/Platelet; Future - Comprehensive metabolic panel; Future - TSH; Future - Lipid panel; Future - Lipid panel - TSH - Comprehensive metabolic panel - CBC with Differential/Platelet  5. Age-related osteoporosis without current pathological fracture - Continue fosamax   6. Anxiety and depression - start on low dose lexapro  - Follow up in one month  - escitalopram (LEXAPRO) 5 MG tablet; Take 1 tablet (5 mg total) by mouth daily.  Dispense: 30 tablet; Refill: 1  7. Chronic midline low back pain without sciatica  - DG Lumbar Spine Complete; Future  Dorothyann Peng, NP

## 2020-12-13 ENCOUNTER — Telehealth: Payer: Self-pay | Admitting: Adult Health

## 2020-12-13 NOTE — Telephone Encounter (Signed)
PT daughter called to verify info about Tetanus shot as she saw the last time the PT got one was 2010 and wanted to verify that was correct. PT daughter also advise that if so then would like to find out if PT can get the Tetanus shot again. Please advise.

## 2020-12-20 ENCOUNTER — Encounter: Payer: Self-pay | Admitting: Family Medicine

## 2020-12-20 ENCOUNTER — Telehealth (INDEPENDENT_AMBULATORY_CARE_PROVIDER_SITE_OTHER): Payer: PPO | Admitting: Family Medicine

## 2020-12-20 DIAGNOSIS — U071 COVID-19: Secondary | ICD-10-CM | POA: Diagnosis not present

## 2020-12-20 MED ORDER — MOLNUPIRAVIR EUA 200MG CAPSULE: 4.0000 | ORAL_CAPSULE | Freq: Two times a day (BID) | ORAL | 0 refills | Status: AC

## 2020-12-20 MED ORDER — BENZONATATE 100 MG PO CAPS: 100.0000 mg | ORAL_CAPSULE | Freq: Three times a day (TID) | ORAL | 0 refills | Status: DC | PRN

## 2020-12-20 NOTE — Progress Notes (Signed)
Virtual Visit via Video Note  I connected with Jennifer Donaldson  on 12/20/20 at 11:00 AM EDT by a video enabled telemedicine application and verified that I am speaking with the correct person using two identifiers.  Location patient: home, [place] Location provider:work or home office Persons participating in the virtual visit: patient, provider, patient's daughter  I discussed the limitations of evaluation and management by telemedicine and the availability of in person appointments. The patient expressed understanding and agreed to proceed.   HPI:  Acute telemedicine visit for Covid19: -Onset: 2 days ago -Symptoms include:congestion, cough, sore throat, feels tired -Denies: CP, SOB, NVD, inability eat/drink/get out of bed -Has tried: tylenol -Pertinent past medical history: see below -Pertinent medication allergies:  Allergies  Allergen Reactions   Celexa [Citalopram]     Confusion     Codeine Sulfate     REACTION: sensitivity to  -COVID-19 vaccine status: 2 doses and 1 booster -recent GFR was 51  ROS: See pertinent positives and negatives per HPI.  Past Medical History:  Diagnosis Date   C. difficile colitis    CAD (coronary artery disease)    Dyslipidemia 12/24/2006   Qualifier: Diagnosis of  By: Sherren Mocha RN, Ellen     Hyperlipidemia    Mitral regurgitation    MITRAL REGURGITATION 08/12/2008   Qualifier: Diagnosis of  By: Burnett Kanaris     Osteopenia    Osteoporosis 03/01/2014   Raynaud's disease    RAYNAUD'S DISEASE 12/24/2006   Qualifier: Diagnosis of  By: Sherren Mocha RN, Dorian Pod     Special screening for malignant neoplasm of colon     Past Surgical History:  Procedure Laterality Date   BREAST EXCISIONAL BIOPSY Left    CORONARY STENT PLACEMENT     2 prior drug eluting stent placed in the LAD and right coronary artery      Current Outpatient Medications:    alendronate (FOSAMAX) 70 MG tablet, Take 70 mg by mouth once a week., Disp: , Rfl: 11   amLODipine (NORVASC) 2.5 MG  tablet, TAKE 1 TABLET (2.5 MG TOTAL) BY MOUTH DAILY., Disp: 90 tablet, Rfl: 1   aspirin 81 MG tablet, Take 81 mg by mouth daily., Disp: , Rfl:    atorvastatin (LIPITOR) 20 MG tablet, TAKE 1 TABLET (20 MG TOTAL) BY MOUTH DAILY AT 6 PM., Disp: 90 tablet, Rfl: 2   benzonatate (TESSALON PERLES) 100 MG capsule, Take 1 capsule (100 mg total) by mouth 3 (three) times daily as needed., Disp: 20 capsule, Rfl: 0   calcium citrate-vitamin D (CITRACAL+D) 315-200 MG-UNIT per tablet, Take 2 tablets by mouth daily. , Disp: , Rfl:    Cholecalciferol (VITAMIN D3) 1000 UNITS CAPS, Take 1 tablet by mouth daily.  , Disp: , Rfl:    doxycycline (VIBRAMYCIN) 50 MG capsule, Take 50 mg oral daily as needed for Rosacea, Disp: , Rfl:    escitalopram (LEXAPRO) 5 MG tablet, Take 1 tablet (5 mg total) by mouth daily., Disp: 30 tablet, Rfl: 1   ezetimibe (ZETIA) 10 MG tablet, TAKE 1 TABLET BY MOUTH EVERY DAY, Disp: 90 tablet, Rfl: 2   ketoconazole (NIZORAL) 2 % shampoo, Apply topically 2 (two) times a week., Disp: , Rfl:    meloxicam (MOBIC) 15 MG tablet, TAKE 1 (ONE) TABLET DAILY AS NEEDED FOR PAIN, Disp: 90 tablet, Rfl: 0   metroNIDAZOLE (METROCREAM) 0.75 % cream, Apply topically 2 (two) times daily., Disp: , Rfl:    molnupiravir EUA 200 mg CAPS, Take 4 capsules (800 mg total) by  mouth 2 (two) times daily for 5 days., Disp: 40 capsule, Rfl: 0   omeprazole (PRILOSEC) 20 MG capsule, Take 1 capsule (20 mg total) by mouth daily., Disp: 30 capsule, Rfl: 1   SHINGRIX injection, , Disp: , Rfl:   EXAM:  VITALS per patient if applicable:  GENERAL: alert, oriented, appears well and in no acute distress  HEENT: atraumatic, conjunttiva clear, no obvious abnormalities on inspection of external nose and ears  NECK: normal movements of the head and neck  LUNGS: on inspection no signs of respiratory distress, breathing rate appears normal, no obvious gross SOB, gasping or wheezing  CV: no obvious cyanosis  MS: moves all visible  extremities without noticeable abnormality  PSYCH/NEURO: pleasant and cooperative, no obvious depression or anxiety, speech and thought processing grossly intact  ASSESSMENT AND PLAN:  Discussed the following assessment and plan:  COVID-19   Discussed treatment options (infusions and oral options and risk of drug interactions), ideal treatment window, potential complications, isolation and precautions for COVID-19.  Discussed possibility of rebound with antivirals and the need to reisolate if it should occur for 5 days. Checked for/reviewed any labs done in the last 90 days with GFR listed in HPI if available.  After lengthy discussion, the patient and her daughter preferred treatment with molnupiravir due to being higher risk for complications of covid or severe disease, she does not wish to make changes to her other medications and other factors. Discussed EUA status of this drug and the fact that there is preliminary limited knowledge of risks/interactions/side effects per EUA document vs possible benefits and precautions. This information was shared with patient during the visit and also was provided in patient instructions. Also, advised that patient discuss risks/interactions and use with pharmacist/treatment team as well.  The patient did want a prescription for cough, Tessalon Rx sent.  Other symptomatic care measures summarized in patient instructions. Advised to seek prompt in person care if worsening, new symptoms arise, or if is not improving with treatment. Discussed options for inperson care if PCP office not available. Did let this patient know that I only do telemedicine on Tuesdays and Thursdays for [place]. Advised to schedule follow up visit with PCP or [facility] if any further questions or concerns to avoid delays in care.   I discussed the assessment and treatment plan with the patient. The patient was provided an opportunity to ask questions and all were answered. The patient agreed  with the plan and demonstrated an understanding of the instructions.     Lucretia Kern, DO

## 2020-12-20 NOTE — Patient Instructions (Signed)
HOME CARE TIPS:  -I sent the medication(s) we discussed to your pharmacy: Meds ordered this encounter  Medications   molnupiravir EUA 200 mg CAPS    Sig: Take 4 capsules (800 mg total) by mouth 2 (two) times daily for 5 days.    Dispense:  40 capsule    Refill:  0   benzonatate (TESSALON PERLES) 100 MG capsule    Sig: Take 1 capsule (100 mg total) by mouth 3 (three) times daily as needed.    Dispense:  20 capsule    Refill:  0     -I sent in the Covid19 treatment or referral you requested per our discussion. Please see the information provided below and discuss further with the pharmacist/treatment team.   -If taking an antiviral, there is a chance of rebound illness after finishing your treatment. If you become sick again please isolate for an additional 5 days.    -can use tylenol if needed for fevers, aches and pains per instructions  -can use nasal saline a few times per day if you have nasal congestion  -stay hydrated, drink plenty of fluids and eat small healthy meals - avoid dairy   -follow up with your doctor in 2-3 days unless improving and feeling better  -stay home while sick, except to seek medical care. If you have COVID19, ideally it would be best to stay home for a full 10 days since the onset of symptoms PLUS one day of no fever and feeling better. Wear a good mask that fits snugly (such as N95 or KN95) if around others to reduce the risk of transmission.  It was nice to meet you today, and I really hope you are feeling better soon. I help [facility] out with telemedicine visits on Tuesdays and Thursdays and am available for visits on those days. If you have any concerns or questions following this visit please schedule a follow up visit with your Primary Care doctor or seek care at a local urgent care clinic to avoid delays in care.    Seek in person care or schedule a follow up video visit promptly if your symptoms worsen, new concerns arise or you are not  improving with treatment. Call 911 and/or seek emergency care if your symptoms are severe or life threatening.    Fact Sheet for Patients And Caregivers Emergency Use Authorization (EUA) Of LAGEVRIOT (molnupiravir) capsules For Coronavirus Disease 2019 (COVID-19)  What is the most important information I should know about LAGEVRIO? LAGEVRIO may cause serious side effects, including: ? LAGEVRIO may cause harm to your unborn baby. It is not known if LAGEVRIO will harm your baby if you take LAGEVRIO during pregnancy. o LAGEVRIO is not recommended for use in pregnancy. o LAGEVRIO has not been studied in pregnancy. LAGEVRIO was studied in pregnant animals only. When LAGEVRIO was given to pregnant animals, LAGEVRIO caused harm to their unborn babies. o You and your healthcare provider may decide that you should take LAGEVRIO during pregnancy if there are no other COVID-19 treatment options approved or authorized by the FDA that are accessible or clinically appropriate for you. o If you and your healthcare provider decide that you should take LAGEVRIO during pregnancy, you and your healthcare provider should discuss the known and potential benefits and the potential risks of taking LAGEVRIO during pregnancy. For individuals who are able to become pregnant: ? You should use a reliable method of birth control (contraception) consistently and correctly during treatment with LAGEVRIO and for 4 days   after the last dose of LAGEVRIO. Talk to your healthcare provider about reliable birth control methods. ? Before starting treatment with LAGEVRIO your healthcare provider may do a pregnancy test to see if you are pregnant before starting treatment with LAGEVRIO. ? Tell your healthcare provider right away if you become pregnant or think you may be pregnant during treatment with LAGEVRIO. Pregnancy Surveillance Program: ? There is a pregnancy surveillance program for individuals who take LAGEVRIO  during pregnancy. The purpose of this program is to collect information about the health of you and your baby. Talk to your healthcare provider about how to take part in this program. ? If you take LAGEVRIO during pregnancy and you agree to participate in the pregnancy surveillance program and allow your healthcare provider to share your information with [employer], then your healthcare provider will report your use of LAGEVRIO during pregnancy to [employer] by calling 1-877-888-4231 or pregnancyreporting.msd.com. For individuals who are sexually active with partners who are able to become pregnant: ? It is not known if LAGEVRIO can affect sperm. While the risk is regarded as low, animal studies to fully assess the potential for LAGEVRIO to affect the babies of males treated with LAGEVRIO have not been completed. A reliable method of birth control (contraception) should be used consistently and correctly during treatment with LAGEVRIO and for at least 3 months after the last dose. The risk to sperm beyond 3 months is not known. Studies to understand the risk to sperm beyond 3 months are ongoing. Talk to your healthcare provider about reliable birth control methods. Talk to your healthcare provider if you have questions or concerns about how LAGEVRIO may affect sperm. You are being given this fact sheet because your healthcare provider believes it is necessary to provide you with LAGEVRIO for the treatment of adults with mild-to-moderate coronavirus disease 2019 (COVID-19) with positive results of direct SARS-CoV-2 viral testing, and who are at high risk for progression to severe COVID-19 including hospitalization or death, and for whom other COVID-19 treatment options approved or authorized by the FDA are not accessible or clinically appropriate. The U.S. Food and Drug Administration (FDA) has issued an Emergency Use Authorization (EUA) to make LAGEVRIO available  during the COVID-19 pandemic (for more details about an EUA please see "What is an Emergency Use Authorization?" at the end of this document). LAGEVRIO is not an FDA-approved medicine in the [place]. Read this Fact Sheet for information about LAGEVRIO. Talk to your healthcare provider about your options if you have any questions. It is your choice to take LAGEVRIO.  What is COVID-19? COVID-19 is caused by a virus called a coronavirus. You can get COVID-19 through close contact with another person who has the virus. COVID-19 illnesses have ranged from very mild-to-severe, including illness resulting in death. While information so far suggests that most COVID-19 illness is mild, serious illness can happen and may cause some of your other medical conditions to become worse. Older people and people of all ages with severe, long lasting (chronic) medical conditions like heart disease, lung disease and diabetes, for example seem to be at higher risk of being hospitalized for COVID-19.  What is LAGEVRIO? LAGEVRIO is an investigational medicine used to treat mild-to-moderate COVID-19 in adults: ? with positive results of direct SARS-CoV-2 viral testing, and ? who are at high risk for progression to severe COVID-19 including hospitalization or death, and for whom other COVID-19 treatment options approved or authorized by the   FDA are not accessible or clinically appropriate. The FDA has authorized the emergency use of LAGEVRIO for the treatment of mild-tomoderate COVID-19 in adults under an EUA. For more information on EUA, see the "What is an Emergency Use Authorization (EUA)?" section at the end of this Fact Sheet. LAGEVRIO is not authorized: ? for use in people less than 18 years of age. ? for prevention of COVID-19. ? for people needing hospitalization for COVID-19. ? for use for longer than 5 consecutive days.  What should I tell my healthcare provider before I take LAGEVRIO? Tell  your healthcare provider if you: ? Have any allergies ? Are breastfeeding or plan to breastfeed ? Have any serious illnesses ? Are taking any medicines (prescription, over-the-counter, vitamins, or herbal products).  How do I take LAGEVRIO? ? Take LAGEVRIO exactly as your healthcare provider tells you to take it. ? Take 4 capsules of LAGEVRIO every 12 hours (for example, at 8 am and at 8 pm) ? Take LAGEVRIO for 5 days. It is important that you complete the full 5 days of treatment with LAGEVRIO. Do not stop taking LAGEVRIO before you complete the full 5 days of treatment, even if you feel better. ? Take LAGEVRIO with or without food. ? You should stay in isolation for as long as your healthcare provider tells you to. Talk to your healthcare provider if you are not sure about how to properly isolate while you have COVID-19. ? Swallow LAGEVRIO capsules whole. Do not open, break, or crush the capsules. If you cannot swallow capsules whole, tell your healthcare provider. ? What to do if you miss a dose: o If it has been less than 10 hours since the missed dose, take it as soon as you remember o If it has been more than 10 hours since the missed dose, skip the missed dose and take your dose at the next scheduled time. ? Do not double the dose of LAGEVRIO to make up for a missed dose.  What are the important possible side effects of LAGEVRIO? ? See, "What is the most important information I should know about LAGEVRIO?" ? Allergic Reactions. Allergic reactions can happen in people taking LAGEVRIO, even after only 1 dose. Stop taking LAGEVRIO and call your healthcare provider right away if you get any of the following symptoms of an allergic reaction: o hives o rapid heartbeat o trouble swallowing or breathing o swelling of the mouth, lips, or face o throat tightness o hoarseness o skin rash The most common side effects of LAGEVRIO are: ? diarrhea ? nausea ? dizziness These are not  all the possible side effects of LAGEVRIO. Not many people have taken LAGEVRIO. Serious and unexpected side effects may happen. This medicine is still being studied, so it is possible that all of the risks are not known at this time.  What other treatment choices are there?  Veklury (remdesivir) is FDA-approved as an intravenous (IV) infusion for the treatment of mildto-moderate COVID-19 in certain adults and children. Talk with your doctor to see if Veklury is appropriate for you. Like LAGEVRIO, FDA may also allow for the emergency use of other medicines to treat people with COVID-19. Go to https://www.fda.gov/emergency-preparedness-and-response/mcm-legalregulatory-and-policy-framework/emergency-use-authorization for more information. It is your choice to be treated or not to be treated with LAGEVRIO. Should you decide not to take it, it will not change your standard medical care.  What if I am breastfeeding? Breastfeeding is not recommended during treatment with LAGEVRIO and for 4 days after   the last dose of LAGEVRIO. If you are breastfeeding or plan to breastfeed, talk to your healthcare provider about your options and specific situation before taking LAGEVRIO.  How do I report side effects with LAGEVRIO? Contact your healthcare provider if you have any side effects that bother you or do not go away. Report side effects to FDA MedWatch at www.fda.gov/medwatch or call 1-800-FDA-1088 (1- 800-332-1088).  How should I store LAGEVRIO? ? Store LAGEVRIO capsules at room temperature between 68F to 77F (20C to 25C). ? Keep LAGEVRIO and all medicines out of the reach of children and pets. How can I learn more about COVID-19? ? Ask your healthcare provider. ? Visit www.cdc.gov/COVID19 ? Contact your local or state public health department. ? Call [employer] at 1-800-672-6372 (toll free in the [place] ? Visit www.molnupiravir.com  What Is an Emergency Use Authorization (EUA)? The  [place] FDA has made LAGEVRIO available under an emergency access mechanism called an Emergency Use Authorization (EUA) The EUA is supported by a Secretary of Health and Human Service (HHS) declaration that circumstances exist to justify emergency use of drugs and biological products during the COVID-19 pandemic. LAGEVRIO for the treatment of mild-to-moderate COVID-19 in adults with positive results of direct SARS-CoV-2 viral testing, who are at high risk for progression to severe COVID-19, including hospitalization or death, and for whom alternative COVID-19 treatment options approved or authorized by FDA are not accessible or clinically appropriate, has not undergone the same type of review as an FDA-approved product. In issuing an EUA under the COVID-19 public health emergency, the FDA has determined, among other things, that based on the total amount of scientific evidence available including data from adequate and well-controlled clinical trials, if available, it is reasonable to believe that the product may be effective for diagnosing, treating, or preventing COVID-19, or a serious or life-threatening disease or condition caused by COVID-19; that the known and potential benefits of the product, when used to diagnose, treat, or prevent such disease or condition, outweigh the known and potential risks of such product; and that there are no adequate, approved, and available alternatives.  All of these criteria must be met to allow for the product to be used in the treatment of patients during the COVID-19 pandemic. The EUA for LAGEVRIO is in effect for the duration of the COVID-19 declaration justifying emergency use of LAGEVRIO, unless terminated or revoked (after which LAGEVRIO may no longer be used under the EUA). For patent information: www.msd.com/research/patent Copyright  2021-2022 [employer] [place], [place] and its affiliates. All rights  reserved. [id number] Revised: March 2022  

## 2020-12-28 ENCOUNTER — Other Ambulatory Visit: Payer: Self-pay | Admitting: Adult Health

## 2020-12-29 ENCOUNTER — Other Ambulatory Visit: Payer: Self-pay | Admitting: Adult Health

## 2020-12-29 DIAGNOSIS — F419 Anxiety disorder, unspecified: Secondary | ICD-10-CM

## 2020-12-29 DIAGNOSIS — F32A Depression, unspecified: Secondary | ICD-10-CM

## 2021-01-01 ENCOUNTER — Other Ambulatory Visit: Payer: Self-pay | Admitting: Internal Medicine

## 2021-01-03 ENCOUNTER — Telehealth: Payer: Self-pay | Admitting: Adult Health

## 2021-01-03 NOTE — Telephone Encounter (Signed)
PT daughter called to advise that the mom is testing negative now for covid but a lot of phlegm still and want to know if the PT can take mucinex for it or would it interact with her medications.

## 2021-01-03 NOTE — Telephone Encounter (Signed)
[place] for pt to take Mucinex?

## 2021-01-04 NOTE — Telephone Encounter (Signed)
Patient daughter notified of update

## 2021-01-11 ENCOUNTER — Ambulatory Visit: Payer: PPO

## 2021-01-23 ENCOUNTER — Other Ambulatory Visit: Payer: Self-pay | Admitting: Internal Medicine

## 2021-01-27 NOTE — Progress Notes (Signed)
Cardiology Office Note   Date:  01/28/2021   ID:  [name], DOB [birth year], MRN [id number]  PCP:  Dorothyann Peng, NP  Cardiologist:   Dorris Carnes, MD   [facility] presents for f/u of CAD     History of Present Illness: Jennifer Donaldson is a 87 y.o. female with a history of CAD  She wa previously seen by T Stuckey  Pt is s/p PCI/DES to LAD and RCA  Nuclear scan in 2013 was normal   I last saw the pt in clinic in June 2021   Geronimo Running after visit showed no ischemia  The pt says her breathing is OK  She denies CP   No edema   NO palpitatoins   Patient remains under inceased sterss with husband  who she lives with at home   He has singificant dementia      Outpatient Medications Prior to Visit  Medication Sig Dispense Refill   alendronate (FOSAMAX) 70 MG tablet Take 70 mg by mouth once a week.  11   amLODipine (NORVASC) 2.5 MG tablet TAKE 1 TABLET BY MOUTH EVERY DAY 90 tablet 1   aspirin 81 MG tablet Take 81 mg by mouth daily.     atorvastatin (LIPITOR) 20 MG tablet TAKE 1 TABLET (20 MG TOTAL) BY MOUTH DAILY AT 6 PM. 90 tablet 2   benzonatate (TESSALON PERLES) 100 MG capsule Take 1 capsule (100 mg total) by mouth 3 (three) times daily as needed. 20 capsule 0   calcium citrate-vitamin D (CITRACAL+D) 315-200 MG-UNIT per tablet Take 2 tablets by mouth daily.      Cholecalciferol (VITAMIN D3) 1000 UNITS CAPS Take 1 tablet by mouth daily.       doxycycline (VIBRAMYCIN) 50 MG capsule Take 50 mg oral daily as needed for Rosacea     escitalopram (LEXAPRO) 5 MG tablet TAKE 1 TABLET (5 MG TOTAL) BY MOUTH DAILY. 90 tablet 1   ezetimibe (ZETIA) 10 MG tablet TAKE 1 TABLET BY MOUTH EVERY DAY 30 tablet 0   ketoconazole (NIZORAL) 2 % shampoo Apply topically 2 (two) times a week.     meloxicam (MOBIC) 15 MG tablet TAKE 1 (ONE) TABLET DAILY AS NEEDED FOR PAIN 90 tablet 0   metroNIDAZOLE (METROCREAM) 0.75 % cream Apply topically 2 (two) times daily.     omeprazole (PRILOSEC) 20  MG capsule Take 1 capsule (20 mg total) by mouth daily. 30 capsule 1   SHINGRIX injection      No facility-administered medications prior to visit.     Allergies:   Celexa [citalopram] and Codeine sulfate   Past Medical History:  Diagnosis Date   C. difficile colitis    CAD (coronary artery disease)    Dyslipidemia 12/24/2006   Qualifier: Diagnosis of  By: Sherren Mocha RN, Ellen     Hyperlipidemia    Mitral regurgitation    MITRAL REGURGITATION 08/12/2008   Qualifier: Diagnosis of  By: Burnett Kanaris     Osteopenia    Osteoporosis 03/01/2014   Raynaud's disease    RAYNAUD'S DISEASE 12/24/2006   Qualifier: Diagnosis of  By: Sherren Mocha RN, Dorian Pod     Special screening for malignant neoplasm of colon     Past Surgical History:  Procedure Laterality Date   BREAST EXCISIONAL BIOPSY Left    CORONARY STENT PLACEMENT     2 prior drug eluting stent placed in the LAD and right coronary artery      Social History:  The patient  reports  that she quit smoking about 37 years ago. Her smoking use included cigarettes. She has a 10.00 pack-year smoking history. She has never used smokeless tobacco. She reports that she does not drink alcohol and does not use drugs.   Family History:  The patient's family history includes CAD in her brother and mother; Stroke in her father.    ROS:  Please see the history of present illness. All other systems are reviewed and  Negative to the above problem except as noted.    PHYSICAL EXAM: VS:  BP 130/70 (BP Location: Left Arm, Patient Position: Sitting, Cuff Size: Normal)   Pulse 95   Ht 4\' 10"  (1.473 m)   Wt 93 lb (42.2 kg)   SpO2 98%   BMI 19.44 kg/m   GEN  Thin 87 yo n no acute distress  HEENT: normal  Neck: JVP is normal   No carotid bruits Cardiac: RRR; no murmurs  No LE  edema Respiratory:  clear to auscultation bilaterally,  GI: soft, nontender, nondistended, + BS  No hepatomegaly  MS: no deformity Moving all extremities   Skin: warm and dry, no  rash Neuro:  Strength and sensation are intact Psych: euthymic mood, full affect    EKG:  EKG is not ordered today   Lipid Panel    Component Value Date/Time   CHOL 165 12/07/2020 1108   CHOL 166 02/16/2018 0801   TRIG 76.0 12/07/2020 1108   HDL 82.20 12/07/2020 1108   HDL 97 02/16/2018 0801   CHOLHDL 2 12/07/2020 1108   VLDL 15.2 12/07/2020 1108   LDLCALC 68 12/07/2020 1108   LDLCALC 56 02/16/2018 0801   LDLDIRECT 124.7 07/20/2012 0958      Wt Readings from Last 3 Encounters:  01/28/21 93 lb (42.2 kg)  12/07/20 95 lb (43.1 kg)  01/18/20 83 lb 9.6 oz (37.9 kg)      ASSESSMENT AND PLAN:  1  CAD Pt with remote intervention   Doing OK now   I am not coinvinced on angina   Follow    2.  HTN  BP is controlled  Continue meds   3  HL  LDL 68  HDL 82  Trig 76  Chol 165  F/U in  Clinic next year   Signed, Dorris Carnes, MD  01/28/2021 1:38 PM    [facility] [address], [place], [place]  [address] Phone: [phone]; Fax: [phone]

## 2021-01-28 ENCOUNTER — Ambulatory Visit: Payer: PPO | Admitting: Internal Medicine

## 2021-01-28 ENCOUNTER — Other Ambulatory Visit: Payer: Self-pay

## 2021-01-28 VITALS — BP 130/70 | HR 95 | Ht <= 58 in | Wt 93.0 lb

## 2021-01-28 DIAGNOSIS — I2510 Atherosclerotic heart disease of native coronary artery without angina pectoris: Secondary | ICD-10-CM | POA: Diagnosis not present

## 2021-01-28 NOTE — Patient Instructions (Signed)

## 2021-01-30 NOTE — Addendum Note (Signed)
Addended by: Maren Beach, Sheyli Horwitz A on: 01/30/2021 10:02 AM   Modules accepted: Orders

## 2021-01-31 ENCOUNTER — Other Ambulatory Visit: Payer: Self-pay

## 2021-01-31 ENCOUNTER — Ambulatory Visit (INDEPENDENT_AMBULATORY_CARE_PROVIDER_SITE_OTHER): Payer: PPO

## 2021-01-31 DIAGNOSIS — Z23 Encounter for immunization: Secondary | ICD-10-CM | POA: Diagnosis not present

## 2021-02-13 ENCOUNTER — Ambulatory Visit: Payer: PPO

## 2021-02-13 ENCOUNTER — Other Ambulatory Visit: Payer: Self-pay | Admitting: Adult Health

## 2021-02-13 DIAGNOSIS — M150 Primary generalized (osteo)arthritis: Secondary | ICD-10-CM

## 2021-02-13 DIAGNOSIS — M159 Polyosteoarthritis, unspecified: Secondary | ICD-10-CM

## 2021-02-14 ENCOUNTER — Ambulatory Visit (INDEPENDENT_AMBULATORY_CARE_PROVIDER_SITE_OTHER): Payer: PPO

## 2021-02-14 DIAGNOSIS — Z0000 Encounter for general adult medical examination without abnormal findings: Secondary | ICD-10-CM | POA: Diagnosis not present

## 2021-02-14 NOTE — Patient Instructions (Signed)
Jennifer Donaldson , Thank you for taking time to come for your Medicare Wellness Visit. I appreciate your ongoing commitment to your health goals. Please review the following plan we discussed and let me know if I can assist you in the future.   Screening recommendations/referrals: Colonoscopy: no longer required  Mammogram: no longer required  Bone Density: 08/16/2019 Recommended yearly ophthalmology/optometry visit for glaucoma screening and checkup Recommended yearly dental visit for hygiene and checkup  Vaccinations: Influenza vaccine: completed  Pneumococcal vaccine: completed  Tdap vaccine: due with injury  Shingles vaccine: will consider     Advanced directives: will consider   Conditions/risks identified: none   Next appointment: none    Preventive Care 14 Years and Older, Female Preventive care refers to lifestyle choices and visits with your health care provider that can promote health and wellness. What does preventive care include? A yearly physical exam. This is also called an annual well check. Dental exams once or twice a year. Routine eye exams. Ask your health care provider how often you should have your eyes checked. Personal lifestyle choices, including: Daily care of your teeth and gums. Regular physical activity. Eating a healthy diet. Avoiding tobacco and drug use. Limiting alcohol use. Practicing safe sex. Taking low-dose aspirin every day. Taking vitamin and mineral supplements as recommended by your health care provider. What happens during an annual well check? The services and screenings done by your health care provider during your annual well check will depend on your age, overall health, lifestyle risk factors, and family history of disease. Counseling  Your health care provider may ask you questions about your: Alcohol use. Tobacco use. Drug use. Emotional well-being. Home and relationship well-being. Sexual activity. Eating  habits. History of falls. Memory and ability to understand (cognition). Work and work Statistician. Reproductive health. Screening  You may have the following tests or measurements: Height, weight, and BMI. Blood pressure. Lipid and cholesterol levels. These may be checked every 5 years, or more frequently if you are over 71 years old. Skin check. Lung cancer screening. You may have this screening every year starting at age 33 if you have a 30-pack-year history of smoking and currently smoke or have quit within the past 15 years. Fecal occult blood test (FOBT) of the stool. You may have this test every year starting at age 80. Flexible sigmoidoscopy or colonoscopy. You may have a sigmoidoscopy every 5 years or a colonoscopy every 10 years starting at age 57. Hepatitis C blood test. Hepatitis B blood test. Sexually transmitted disease (STD) testing. Diabetes screening. This is done by checking your blood sugar (glucose) after you have not eaten for a while (fasting). You may have this done every 1-3 years. Bone density scan. This is done to screen for osteoporosis. You may have this done starting at age 55. Mammogram. This may be done every 1-2 years. Talk to your health care provider about how often you should have regular mammograms. Talk with your health care provider about your test results, treatment options, and if necessary, the need for more tests. Vaccines  Your health care provider may recommend certain vaccines, such as: Influenza vaccine. This is recommended every year. Tetanus, diphtheria, and acellular pertussis (Tdap, Td) vaccine. You may need a Td booster every 10 years. Zoster vaccine. You may need this after age 54. Pneumococcal 13-valent conjugate (PCV13) vaccine. One dose is recommended after age 58. Pneumococcal polysaccharide (PPSV23) vaccine. One dose is recommended after age 66. Talk to your health care provider  about which screenings and vaccines you need and how  often you need them. This information is not intended to replace advice given to you by your health care provider. Make sure you discuss any questions you have with your health care provider. Document Released: 04/27/2015 Document Revised: 12/19/2015 Document Reviewed: 01/30/2015 Elsevier Interactive Patient Education  2017 [place] Prevention in the Home Falls can cause injuries. They can happen to people of all ages. There are many things you can do to make your home safe and to help prevent falls. What can I do on the outside of my home? Regularly fix the edges of walkways and driveways and fix any cracks. Remove anything that might make you trip as you walk through a door, such as a raised step or threshold. Trim any bushes or trees on the path to your home. Use bright outdoor lighting. Clear any walking paths of anything that might make someone trip, such as rocks or tools. Regularly check to see if handrails are loose or broken. Make sure that both sides of any steps have handrails. Any raised decks and porches should have guardrails on the edges. Have any leaves, snow, or ice cleared regularly. Use sand or salt on walking paths during winter. Clean up any spills in your garage right away. This includes oil or grease spills. What can I do in the bathroom? Use night lights. Install grab bars by the toilet and in the tub and shower. Do not use towel bars as grab bars. Use non-skid mats or decals in the tub or shower. If you need to sit down in the shower, use a plastic, non-slip stool. Keep the floor dry. Clean up any water that spills on the floor as soon as it happens. Remove soap buildup in the tub or shower regularly. Attach bath mats securely with double-sided non-slip rug tape. Do not have throw rugs and other things on the floor that can make you trip. What can I do in the bedroom? Use night lights. Make sure that you have a light by your bed that is easy to  reach. Do not use any sheets or blankets that are too big for your bed. They should not hang down onto the floor. Have a firm chair that has side arms. You can use this for support while you get dressed. Do not have throw rugs and other things on the floor that can make you trip. What can I do in the kitchen? Clean up any spills right away. Avoid walking on wet floors. Keep items that you use a lot in easy-to-reach places. If you need to reach something above you, use a strong step stool that has a grab bar. Keep electrical cords out of the way. Do not use floor polish or wax that makes floors slippery. If you must use wax, use non-skid floor wax. Do not have throw rugs and other things on the floor that can make you trip. What can I do with my stairs? Do not leave any items on the stairs. Make sure that there are handrails on both sides of the stairs and use them. Fix handrails that are broken or loose. Make sure that handrails are as long as the stairways. Check any carpeting to make sure that it is firmly attached to the stairs. Fix any carpet that is loose or worn. Avoid having throw rugs at the top or bottom of the stairs. If you do have throw rugs, attach them to the floor  with carpet tape. Make sure that you have a light switch at the top of the stairs and the bottom of the stairs. If you do not have them, ask someone to add them for you. What else can I do to help prevent falls? Wear shoes that: Do not have high heels. Have rubber bottoms. Are comfortable and fit you well. Are closed at the toe. Do not wear sandals. If you use a stepladder: Make sure that it is fully opened. Do not climb a closed stepladder. Make sure that both sides of the stepladder are locked into place. Ask someone to hold it for you, if possible. Clearly mark and make sure that you can see: Any grab bars or handrails. First and last steps. Where the edge of each step is. Use tools that help you move  around (mobility aids) if they are needed. These include: Canes. Walkers. Scooters. Crutches. Turn on the lights when you go into a dark area. Replace any light bulbs as soon as they burn out. Set up your furniture so you have a clear path. Avoid moving your furniture around. If any of your floors are uneven, fix them. If there are any pets around you, be aware of where they are. Review your medicines with your doctor. Some medicines can make you feel dizzy. This can increase your chance of falling. Ask your doctor what other things that you can do to help prevent falls. This information is not intended to replace advice given to you by your health care provider. Make sure you discuss any questions you have with your health care provider. Document Released: 01/25/2009 Document Revised: 09/06/2015 Document Reviewed: 05/05/2014 Elsevier Interactive Patient Education  2017 [employer].

## 2021-02-14 NOTE — Progress Notes (Signed)
Subjective:   Jennifer Donaldson is a 87 y.o. female who presents for Medicare Annual (Subsequent) preventive examination.  I connected with Jennifer Donaldson today by telephone and verified that I am speaking with the correct person using two identifiers. Location patient: home Location provider: work Persons participating in the virtual visit: patient, provider.   I discussed the limitations, risks, security and privacy concerns of performing an evaluation and management service by telephone and the availability of in person appointments. I also discussed with the patient that there may be a patient responsible charge related to this service. The patient expressed understanding and verbally consented to this telephonic visit.    Interactive audio and video telecommunications were attempted between this provider and patient, however failed, due to patient having technical difficulties OR patient did not have access to video capability.  We continued and completed visit with audio only.    Review of Systems           Objective:    Today's Vitals   There is no height or weight on file to calculate BMI.  Advanced Directives 02/14/2021 02/13/2020 07/17/2017  Does Patient Have a Medical Advance Directive? Yes Yes No  Type of Paramedic of [place];Living will [place];Living will -  Does patient want to make changes to medical advance directive? - No - Patient declined -  Copy of [place] in Chart? No - copy requested No - copy requested -    Current Medications (verified) Outpatient Encounter Medications as of 02/14/2021  Medication Sig   alendronate (FOSAMAX) 70 MG tablet Take 70 mg by mouth once a week.   amLODipine (NORVASC) 2.5 MG tablet TAKE 1 TABLET BY MOUTH EVERY DAY   aspirin 81 MG tablet Take 81 mg by mouth daily.   atorvastatin (LIPITOR) 20 MG tablet TAKE 1 TABLET (20 MG TOTAL) BY MOUTH DAILY AT 6 PM.    calcium citrate-vitamin D (CITRACAL+D) 315-200 MG-UNIT per tablet Take 2 tablets by mouth daily.    Cholecalciferol (VITAMIN D3) 1000 UNITS CAPS Take 1 tablet by mouth daily.     doxycycline (VIBRAMYCIN) 50 MG capsule Take 50 mg oral daily as needed for Rosacea   escitalopram (LEXAPRO) 5 MG tablet TAKE 1 TABLET (5 MG TOTAL) BY MOUTH DAILY.   ketoconazole (NIZORAL) 2 % shampoo Apply topically 2 (two) times a week.   meloxicam (MOBIC) 15 MG tablet TAKE 1 (ONE) TABLET DAILY AS NEEDED FOR PAIN   metroNIDAZOLE (METROCREAM) 0.75 % cream Apply topically 2 (two) times daily.   omeprazole (PRILOSEC) 20 MG capsule Take 1 capsule (20 mg total) by mouth daily.   benzonatate (TESSALON PERLES) 100 MG capsule Take 1 capsule (100 mg total) by mouth 3 (three) times daily as needed. (Patient not taking: Reported on 02/14/2021)   ezetimibe (ZETIA) 10 MG tablet TAKE 1 TABLET BY MOUTH EVERY DAY (Patient not taking: Reported on 02/14/2021)   SHINGRIX injection    No facility-administered encounter medications on file as of 02/14/2021.    Allergies (verified) Celexa [citalopram] and Codeine sulfate   History: Past Medical History:  Diagnosis Date   C. difficile colitis    CAD (coronary artery disease)    Dyslipidemia 12/24/2006   Qualifier: Diagnosis of  By: Sherren Mocha RN, Ellen     Hyperlipidemia    Mitral regurgitation    MITRAL REGURGITATION 08/12/2008   Qualifier: Diagnosis of  By: Burnett Kanaris     Osteopenia    Osteoporosis  03/01/2014   Raynaud's disease    RAYNAUD'S DISEASE 12/24/2006   Qualifier: Diagnosis of  By: Sherren Mocha, RN, Dorian Pod     Special screening for malignant neoplasm of colon    Past Surgical History:  Procedure Laterality Date   BREAST EXCISIONAL BIOPSY Left    CORONARY STENT PLACEMENT     2 prior drug eluting stent placed in the LAD and right coronary artery    Family History  Problem Relation Age of Onset   CAD Brother    Stroke Father    CAD Mother    Breast cancer Neg Hx     Social History   Socioeconomic History   Marital status: Married    Spouse name: Not on file   Number of children: Not on file   Years of education: Not on file   Highest education level: Not on file  Occupational History   Not on file  Tobacco Use   Smoking status: Former    Packs/day: 0.50    Years: 20.00    Pack years: 10.00    Types: Cigarettes    Quit date: 09/01/1983    Years since quitting: 37.4   Smokeless tobacco: Never  Vaping Use   Vaping Use: Never used  Substance and Sexual Activity   Alcohol use: No    Alcohol/week: 0.0 standard drinks   Drug use: No   Sexual activity: Yes    Birth control/protection: None    Comment: Married  Other Topics Concern   Not on file  Social History Narrative   Retired Engineer, structural   Married for 13 years - husband has multiple medical issues and is disabled   4 children   Grew up in [place] Determinants of Radio broadcast assistant Strain: Low Risk    Difficulty of Paying Living Expenses: Not hard at all  Food Insecurity: No Food Insecurity   Worried About Charity fundraiser in the Last Year: Never true   Arboriculturist in the Last Year: Never true  Transportation Needs: No Transportation Needs   Lack of Transportation (Medical): No   Lack of Transportation (Non-Medical): No  Physical Activity: Insufficiently Active   Days of Exercise per Week: 3 days   Minutes of Exercise per Session: 30 min  Stress: No Stress Concern Present   Feeling of Stress : Not at all  Social Connections: Moderately Integrated   Frequency of Communication with Friends and Family: Twice a week   Frequency of Social Gatherings with Friends and Family: Twice a week   Attends Religious Services: 1 to 4 times per year   Active Member of [employer] or Organizations: No   Attends Music therapist: Never   Marital Status: Married    Tobacco Counseling Counseling given: Not Answered   Clinical  Intake:  Pre-visit preparation completed: Yes  Pain : No/denies pain     Nutritional Risks: None Diabetes: No  How often do you need to have someone help you when you read instructions, pamphlets, or other written materials from your doctor or pharmacy?: 1 - Never What is the last grade level you completed in school?: 10trh grade  Diabetic?no  Interpreter Needed?: No  Information entered by :: L.Riti Rollyson,LPN   Activities of Daily Living No flowsheet data found.  Patient Care Team: Dorothyann Peng, NP as PCP - General (Family Medicine) Fay Records, MD as PCP - Cardiology (Cardiology) Desantiago, Anne Ng, [facility] (Pharmacist)  Indicate any recent  Medical Services you may have received from other than [facility] providers in the past year (date may be approximate).     Assessment:   This is a routine wellness examination for [place].  Hearing/Vision screen Vision Screening - Comments:: Annual eye exam wear contacts   Dietary issues and exercise activities discussed:     Goals Addressed             This Visit's Progress    Exercise 150 min/wk Moderate Activity   On track    Go to the [facility] center more often if you can get some relief  Get help with organized        Depression Screen PHQ 2/9 Scores 02/14/2021 02/13/2020 01/18/2020 11/05/2017 07/17/2017 08/02/2013  PHQ - 2 Score 0 0 0 0 1 0  PHQ- 9 Score - 0 - - - -    Fall Risk Fall Risk  02/13/2020 03/09/2019 11/05/2017 07/17/2017 08/02/2013  Falls in the past year? 0 0 No No No  Comment - Emmi Telephone Survey: data to providers prior to load - - -  Number falls in past yr: 0 - - - -  Injury with Fall? 0 - - - -  Risk for fall due to : No Fall Risks - - - -  Follow up Falls evaluation completed;Falls prevention discussed - - - -    FALL RISK PREVENTION PERTAINING TO THE HOME:  Any stairs in or around the home? No  If so, are there any without handrails? No  Home free of loose throw rugs in walkways, pet beds, electrical  cords, etc? Yes  Adequate lighting in your home to reduce risk of falls? Yes   ASSISTIVE DEVICES UTILIZED TO PREVENT FALLS:  Life alert? No  Use of a cane, walker or w/c? No  Grab bars in the bathroom? Yes  Shower chair or bench in shower? Yes  Elevated toilet seat or a handicapped toilet? No   Cognitive Function: Normal cognitive status assessed by direct observation by this Nurse Health Advisor. No abnormalities found.   MMSE - Mini Mental State Exam 07/17/2017  Not completed: (No Data)     6CIT Screen 02/13/2020  What Year? 0 points  What month? 0 points  What time? 0 points  Count back from 20 0 points  Months in reverse 4 points  Repeat phrase 6 points  Total Score 10    Immunizations Immunization History  Administered Date(s) Administered   Fluad Quad(high Dose 65+) 01/17/2019   Influenza Split 01/22/2011, 02/11/2012   Influenza Whole 01/26/2008, 02/06/2009, 01/04/2010   Influenza, High Dose Seasonal PF 12/29/2014, 02/06/2016, 02/04/2017, 02/09/2018   Influenza,inj,Quad PF,6+ Mos 12/14/2012, 01/17/2014, 01/31/2021   Moderna Sars-Covid-2 Vaccination 04/15/2019, 05/16/2019   Pneumococcal Conjugate-13 01/17/2014   Pneumococcal Polysaccharide-23 12/06/1999   Td 04/14/1998, 09/20/2008   Zoster, Live 04/15/2007    TDAP status: Due, Education has been provided regarding the importance of this vaccine. Advised may receive this vaccine at local pharmacy or Health Dept. Aware to provide a copy of the vaccination record if obtained from local pharmacy or Health Dept. Verbalized acceptance and understanding.  Flu Vaccine status: Up to date  Pneumococcal vaccine status: Up to date  Covid-19 vaccine status: Completed vaccines  Qualifies for Shingles Vaccine? Yes   Zostavax completed No   Shingrix Completed?: No.    Education has been provided regarding the importance of this vaccine. Patient has been advised to call insurance company to determine out of pocket expense if  they  have not yet received this vaccine. Advised may also receive vaccine at local pharmacy or Health Dept. Verbalized acceptance and understanding.  Screening Tests Health Maintenance  Topic Date Due   Zoster Vaccines- Shingrix (1 of 2) Never done   TETANUS/TDAP  09/21/2018   COVID-19 Vaccine (3 - Booster for Moderna series) 07/11/2019   Pneumonia Vaccine 65+ Years old  Completed   INFLUENZA VACCINE  Completed   DEXA SCAN  Completed   HPV VACCINES  Aged Out    Health Maintenance  Health Maintenance Due  Topic Date Due   Zoster Vaccines- Shingrix (1 of 2) Never done   TETANUS/TDAP  09/21/2018   COVID-19 Vaccine (3 - Booster for Moderna series) 07/11/2019    Colorectal cancer screening: No longer required.   Mammogram status: No longer required due to age.  Bone Density status: Completed 08/16/2019. Results reflect: Bone density results: OSTEOPOROSIS. Repeat every 2 years.  Lung Cancer Screening: (Low Dose CT Chest recommended if Age 7-80 years, 30 pack-year currently smoking OR have quit w/in 15years.) does not qualify.   Lung Cancer Screening Referral: n/a  Additional Screening:  Hepatitis C Screening: does not qualify;  Vision Screening: Recommended annual ophthalmology exams for early detection of glaucoma and other disorders of the eye. Is the patient up to date with their annual eye exam?  Yes  Who is the provider or what is the name of the office in which the patient attends annual eye exams? Dr.Digby  If pt is not established with a provider, would they like to be referred to a provider to establish care? No .   Dental Screening: Recommended annual dental exams for proper oral hygiene  Community Resource Referral / Chronic Care Management: CRR required this visit?  No   CCM required this visit?  No      Plan:     I have personally reviewed and noted the following in the patient's chart:   Medical and social history Use of alcohol, tobacco or illicit  drugs  Current medications and supplements including opioid prescriptions.  Functional ability and status Nutritional status Physical activity Advanced directives List of other physicians Hospitalizations, surgeries, and ER visits in previous 12 months Vitals Screenings to include cognitive, depression, and falls Referrals and appointments  In addition, I have reviewed and discussed with patient certain preventive protocols, quality metrics, and best practice recommendations. A written personalized care plan for preventive services as well as general preventive health recommendations were provided to patient.     Randel Pigg, LPN   27/10/[id number]   Nurse Notes: none

## 2021-02-15 ENCOUNTER — Other Ambulatory Visit: Payer: Self-pay | Admitting: Internal Medicine

## 2021-03-22 ENCOUNTER — Ambulatory Visit: Payer: PPO

## 2021-04-25 ENCOUNTER — Encounter: Payer: Self-pay | Admitting: Adult Health

## 2021-04-25 ENCOUNTER — Ambulatory Visit (INDEPENDENT_AMBULATORY_CARE_PROVIDER_SITE_OTHER): Payer: PPO | Admitting: Adult Health

## 2021-04-25 ENCOUNTER — Other Ambulatory Visit: Payer: Self-pay

## 2021-04-25 ENCOUNTER — Ambulatory Visit (INDEPENDENT_AMBULATORY_CARE_PROVIDER_SITE_OTHER): Payer: PPO

## 2021-04-25 VITALS — BP 126/70 | Temp 97.0°F | Ht <= 58 in | Wt 91.0 lb

## 2021-04-25 DIAGNOSIS — M5442 Lumbago with sciatica, left side: Secondary | ICD-10-CM

## 2021-04-25 DIAGNOSIS — G8929 Other chronic pain: Secondary | ICD-10-CM

## 2021-04-25 DIAGNOSIS — M545 Low back pain, unspecified: Secondary | ICD-10-CM | POA: Diagnosis not present

## 2021-04-25 DIAGNOSIS — M8588 Other specified disorders of bone density and structure, other site: Secondary | ICD-10-CM | POA: Diagnosis not present

## 2021-04-25 DIAGNOSIS — M25552 Pain in left hip: Secondary | ICD-10-CM | POA: Diagnosis not present

## 2021-04-25 MED ORDER — CYCLOBENZAPRINE HCL 5 MG PO TABS: 5.0000 mg | ORAL_TABLET | Freq: Every day | ORAL | 0 refills | Status: DC

## 2021-04-25 MED ORDER — METHYLPREDNISOLONE 4 MG PO TBPK: ORAL_TABLET | ORAL | 0 refills | Status: DC

## 2021-04-25 NOTE — Progress Notes (Signed)
Subjective:    Patient ID: Jennifer Donaldson, female    DOB: 1932-07-25, 88 y.o.   MRN: [id number]  HPI 88 year old female who  has a past medical history of C. difficile colitis, CAD (coronary artery disease), Dyslipidemia (12/24/2006), Hyperlipidemia, Mitral regurgitation, MITRAL REGURGITATION (08/12/2008), Osteopenia, Osteoporosis (03/01/2014), Raynaud's disease, RAYNAUD'S DISEASE (12/24/2006), and Special screening for malignant neoplasm of colon.  ` She is being evaluated today for chronic but worsening low back pain. Over the last few months she has had a radiating pain down the outside of her lower leg. Unable to describe the pain reports that it shoots down her leg. Cause her to feel weak in her left leg. Pain is worse with change of positions and laying in bed.     Review of Systems See HPI   Past Medical History:  Diagnosis Date   C. difficile colitis    CAD (coronary artery disease)    Dyslipidemia 12/24/2006   Qualifier: Diagnosis of  By: Sherren Mocha RN, Ellen     Hyperlipidemia    Mitral regurgitation    MITRAL REGURGITATION 08/12/2008   Qualifier: Diagnosis of  By: Burnett Kanaris     Osteopenia    Osteoporosis 03/01/2014   Raynaud's disease    RAYNAUD'S DISEASE 12/24/2006   Qualifier: Diagnosis of  By: Sherren Mocha RN, Dorian Pod     Special screening for malignant neoplasm of colon     Social History   Socioeconomic History   Marital status: Married    Spouse name: Not on file   Number of children: Not on file   Years of education: Not on file   Highest education level: Not on file  Occupational History   Not on file  Tobacco Use   Smoking status: Former    Packs/day: 0.50    Years: 20.00    Pack years: 10.00    Types: Cigarettes    Quit date: 09/01/1983    Years since quitting: 37.6   Smokeless tobacco: Never  Vaping Use   Vaping Use: Never used  Substance and Sexual Activity   Alcohol use: No    Alcohol/week: 0.0 standard drinks   Drug use: No   Sexual  activity: Yes    Birth control/protection: None    Comment: Married  Other Topics Concern   Not on file  Social History Narrative   Retired Engineer, structural   Married for 48 years - husband has multiple medical issues and is disabled   4 children   Grew up in [place] Strain: Low Risk    Difficulty of Paying Living Expenses: Not hard at all  Food Insecurity: No Food Insecurity   Worried About Charity fundraiser in the Last Year: Never true   Arboriculturist in the Last Year: Never true  Transportation Needs: No Transportation Needs   Lack of Transportation (Medical): No   Lack of Transportation (Non-Medical): No  Physical Activity: Insufficiently Active   Days of Exercise per Week: 3 days   Minutes of Exercise per Session: 30 min  Stress: No Stress Concern Present   Feeling of Stress : Not at all  Social Connections: Moderately Integrated   Frequency of Communication with Friends and Family: Twice a week   Frequency of Social Gatherings with Friends and Family: Twice a week   Attends Religious Services: 1 to 4 times per year   Active Member of [employer] or Organizations:  No   Attends Archivist Meetings: Never   Marital Status: Married  Human resources officer Violence: Not At Risk   Fear of Current or Ex-Partner: No   Emotionally Abused: No   Physically Abused: No   Sexually Abused: No    Past Surgical History:  Procedure Laterality Date   BREAST EXCISIONAL BIOPSY Left    CORONARY STENT PLACEMENT     2 prior drug eluting stent placed in the LAD and right coronary artery     Family History  Problem Relation Age of Onset   CAD Brother    Stroke Father    CAD Mother    Breast cancer Neg Hx     Allergies  Allergen Reactions   Celexa [Citalopram]     Confusion     Codeine Sulfate     REACTION: sensitivity to    Current Outpatient Medications on File Prior to Visit  Medication Sig Dispense  Refill   alendronate (FOSAMAX) 70 MG tablet Take 70 mg by mouth once a week.  11   amLODipine (NORVASC) 2.5 MG tablet TAKE 1 TABLET BY MOUTH EVERY DAY 90 tablet 1   aspirin 81 MG tablet Take 81 mg by mouth daily.     atorvastatin (LIPITOR) 20 MG tablet TAKE 1 TABLET (20 MG TOTAL) BY MOUTH DAILY AT 6 PM. 90 tablet 2   benzonatate (TESSALON PERLES) 100 MG capsule Take 1 capsule (100 mg total) by mouth 3 (three) times daily as needed. 20 capsule 0   calcium citrate-vitamin D (CITRACAL+D) 315-200 MG-UNIT per tablet Take 2 tablets by mouth daily.      Cholecalciferol (VITAMIN D3) 1000 UNITS CAPS Take 1 tablet by mouth daily.       escitalopram (LEXAPRO) 5 MG tablet TAKE 1 TABLET (5 MG TOTAL) BY MOUTH DAILY. 90 tablet 1   ezetimibe (ZETIA) 10 MG tablet TAKE 1 TABLET BY MOUTH EVERY DAY 30 tablet 11   ketoconazole (NIZORAL) 2 % shampoo Apply topically 2 (two) times a week.     meloxicam (MOBIC) 15 MG tablet TAKE 1 (ONE) TABLET DAILY AS NEEDED FOR PAIN 90 tablet 0   metroNIDAZOLE (METROCREAM) 0.75 % cream Apply topically 2 (two) times daily.     omeprazole (PRILOSEC) 20 MG capsule Take 1 capsule (20 mg total) by mouth daily. 30 capsule 1   SHINGRIX injection      No current facility-administered medications on file prior to visit.    BP 126/70    Temp (!) 97 F (36.1 C) (Oral)    Ht 4\' 10"  (1.473 m)    Wt 91 lb (41.3 kg)    BMI 19.02 kg/m       Objective:   Physical Exam Vitals and nursing note reviewed.  Constitutional:      Appearance: Normal appearance.  Musculoskeletal:     Lumbar back: No tenderness. Normal range of motion. Scoliosis present.     Comments: Right side curvature of lumbar spine   Skin:    General: Skin is warm and dry.  Neurological:     General: No focal deficit present.     Mental Status: She is alert and oriented to person, place, and time.  Psychiatric:        Mood and Affect: Mood normal.        Behavior: Behavior normal.        Thought Content: Thought  content normal.        Judgment: Judgment normal.      Assessment &  Plan:  1. Chronic left-sided low back pain with left-sided sciatica - Consider referral to orthopedics or PT  - DG Lumbar Spine Complete; Future - methylPREDNISolone (MEDROL DOSEPAK) 4 MG TBPK tablet; Take as directed  Dispense: 21 tablet; Refill: 0 - cyclobenzaprine (FLEXERIL) 5 MG tablet; Take 1 tablet (5 mg total) by mouth at bedtime.  Dispense: 15 tablet; Refill: 0 - DG Hip Unilat W OR W/O Pelvis 2-3 Views Left; Future - Follow up if not improving in the next week  Dorothyann Peng, NP

## 2021-04-26 ENCOUNTER — Telehealth: Payer: Self-pay

## 2021-04-26 NOTE — Telephone Encounter (Signed)
---  Caller states Mother having some back pain, believes she accidently took some extra doses of pills, 3 muscle relaxers (cylobenzoprin), 2 doses of Lexapro. no symptoms  04/25/2021 9:38:55 PM Call [facility] Files, RN, Rachel Bo  04/26/21 1559: Tito Dine states that "it turned out to not be what we thought". Medication that was prescribed on 04/25/21, daughter states that she gave instructions on how to take medication, but as she spoke with her mother again later (by phone) she said her mother sounded confused & thought, that based on what her mom was saying to her, she believed she thought her mom took 3 days worth of muscle relaxer. After having someone check on her mom & counting the Flexeril pills, all pills were accounted for. Ingrid appreciated the f/u call to check on her mom.

## 2021-04-30 ENCOUNTER — Other Ambulatory Visit: Payer: Self-pay

## 2021-04-30 NOTE — Progress Notes (Signed)
Spoke to pt daughter Tito Dine and she stated that pt has become confused again after taking Lexapro and was wondering if its ok to d/c. Spoke to Rosalie and he stated it was ok to d/c.  Tito Dine also wanted to know if pt can continue taking the flexiril daily. Advised that per Tommi Rumps this medication can cause confusion in elderly adults. Ingrid advised per [facility] to only give to pt Prn. Ingrid verbalized understanding.

## 2021-04-30 NOTE — Progress Notes (Signed)
Jennifer Donaldson want to know if pt is suppose to take the omeprazole daily. If so, she would like for pt to have a prescription on file at the pharmacy. Please advise

## 2021-05-01 ENCOUNTER — Other Ambulatory Visit: Payer: Self-pay | Admitting: Adult Health

## 2021-05-01 DIAGNOSIS — K2100 Gastro-esophageal reflux disease with esophagitis, without bleeding: Secondary | ICD-10-CM

## 2021-05-01 MED ORDER — OMEPRAZOLE 20 MG PO CPDR: 20.0000 mg | DELAYED_RELEASE_CAPSULE | Freq: Every day | ORAL | 3 refills | Status: DC

## 2021-05-17 ENCOUNTER — Other Ambulatory Visit: Payer: Self-pay | Admitting: Adult Health

## 2021-05-17 DIAGNOSIS — M159 Polyosteoarthritis, unspecified: Secondary | ICD-10-CM

## 2021-06-18 ENCOUNTER — Other Ambulatory Visit: Payer: Self-pay | Admitting: Adult Health

## 2021-06-21 ENCOUNTER — Telehealth: Payer: Self-pay | Admitting: Internal Medicine

## 2021-06-21 MED ORDER — ATORVASTATIN CALCIUM 20 MG PO TABS: 20.0000 mg | ORAL_TABLET | Freq: Every day | ORAL | 2 refills | Status: DC

## 2021-06-21 NOTE — Telephone Encounter (Signed)
Pt's medication was sent to pt's pharmacy as requested. Confirmation received.  °

## 2021-06-21 NOTE — Telephone Encounter (Signed)
?*  STAT* If patient is at the pharmacy, call can be transferred to refill team. ? ? ?1. Which medications need to be refilled? (please list name of each medication and dose if known) atorvastatin (LIPITOR) 20 MG tablet ? ?2. Which pharmacy/location (including street and city if local pharmacy) is medication to be sent to? [id number]- [place], [place] - [address] AT [address]? ?3. Do they need a 30 day or 90 day supply? 90 day  ?

## 2021-07-02 ENCOUNTER — Other Ambulatory Visit: Payer: Self-pay | Admitting: Adult Health

## 2021-07-02 DIAGNOSIS — F32A Depression, unspecified: Secondary | ICD-10-CM

## 2021-07-02 DIAGNOSIS — F419 Anxiety disorder, unspecified: Secondary | ICD-10-CM

## 2021-08-14 ENCOUNTER — Other Ambulatory Visit: Payer: Self-pay | Admitting: Adult Health

## 2021-08-14 DIAGNOSIS — M159 Polyosteoarthritis, unspecified: Secondary | ICD-10-CM

## 2021-09-07 ENCOUNTER — Other Ambulatory Visit: Payer: Self-pay | Admitting: Adult Health

## 2021-09-07 DIAGNOSIS — G8929 Other chronic pain: Secondary | ICD-10-CM

## 2021-09-10 NOTE — Telephone Encounter (Signed)
Okay for refill?  

## 2021-10-23 DIAGNOSIS — L239 Allergic contact dermatitis, unspecified cause: Secondary | ICD-10-CM | POA: Diagnosis not present

## 2021-10-23 DIAGNOSIS — L237 Allergic contact dermatitis due to plants, except food: Secondary | ICD-10-CM | POA: Diagnosis not present

## 2021-11-08 ENCOUNTER — Other Ambulatory Visit: Payer: Self-pay | Admitting: Adult Health

## 2021-11-08 DIAGNOSIS — M159 Polyosteoarthritis, unspecified: Secondary | ICD-10-CM

## 2021-11-15 DIAGNOSIS — H00025 Hordeolum internum left lower eyelid: Secondary | ICD-10-CM | POA: Diagnosis not present

## 2021-12-04 ENCOUNTER — Encounter: Payer: Self-pay | Admitting: Adult Health

## 2021-12-04 ENCOUNTER — Ambulatory Visit (INDEPENDENT_AMBULATORY_CARE_PROVIDER_SITE_OTHER): Payer: PPO | Admitting: Adult Health

## 2021-12-04 VITALS — BP 132/70 | HR 87 | Temp 97.9°F | Ht <= 58 in | Wt 90.0 lb

## 2021-12-04 DIAGNOSIS — R350 Frequency of micturition: Secondary | ICD-10-CM | POA: Diagnosis not present

## 2021-12-04 DIAGNOSIS — F4321 Adjustment disorder with depressed mood: Secondary | ICD-10-CM

## 2021-12-04 DIAGNOSIS — R4189 Other symptoms and signs involving cognitive functions and awareness: Secondary | ICD-10-CM | POA: Diagnosis not present

## 2021-12-04 DIAGNOSIS — F339 Major depressive disorder, recurrent, unspecified: Secondary | ICD-10-CM

## 2021-12-04 DIAGNOSIS — R410 Disorientation, unspecified: Secondary | ICD-10-CM

## 2021-12-04 LAB — POCT URINALYSIS DIPSTICK
Bilirubin, UA: NEGATIVE
Blood, UA: NEGATIVE
Glucose, UA: NEGATIVE
Leukocytes, UA: NEGATIVE
Nitrite, UA: NEGATIVE
Protein, UA: POSITIVE — AB
Spec Grav, UA: 1.015 (ref 1.010–1.025)
Urobilinogen, UA: 0.2 E.U./dL
pH, UA: 6 (ref 5.0–8.0)

## 2021-12-04 NOTE — Progress Notes (Signed)
Subjective:    Patient ID: NICKI FURLAN, female    DOB: September 17, 1932, 88 y.o.   MRN: [id number]  HPI  88 year old female who  has a past medical history of C. difficile colitis, CAD (coronary artery disease), Dyslipidemia (12/24/2006), Hyperlipidemia, Mitral regurgitation, MITRAL REGURGITATION (08/12/2008), Osteopenia, Osteoporosis (03/01/2014), Raynaud's disease, RAYNAUD'S DISEASE (12/24/2006), and Special screening for malignant neoplasm of colon.  She is being seen today for an acute issue of concern for a UTI. Her daughter brought her in today for increased confusion and concern for UTI.   Patient reports that over the last few weeks she has had more urgency and frequency with urination.  She denies hematuria, or dysuria.  She and her daughter feel as though she has had some cognitive issues that have progressed over the last couple of months as well.  Patient feels as though she has had cognitive issues for quite some time, finds it difficult to remember people's names and can have difficulty thinking of words.  She recently lost her husband about 2-1/2 months ago with home she was married to for roughly 77 years.  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  In that time she was under a lot of emotional psychological, and physical stress.  She did not have time to take care of herself.  She states "I is still not over it and I am still grieving his death".  She does report feeling depressed.  She does not drive long distaned and her family members take care of her grocery shopping and finances for her. She does not leave the house much since his death  She does have a history of depression and has trialed Celexa in the past but did not like the way it made her feel.  She is eating well and sleeping states "some days I do not want to get out of bed but I make myself do so".  Review of Systems See HPI   Past Medical History:  Diagnosis Date   C. difficile  colitis    CAD (coronary artery disease)    Dyslipidemia 12/24/2006   Qualifier: Diagnosis of  By: Sherren Mocha RN, Ellen     Hyperlipidemia    Mitral regurgitation    MITRAL REGURGITATION 08/12/2008   Qualifier: Diagnosis of  By: Burnett Kanaris     Osteopenia    Osteoporosis 03/01/2014   Raynaud's disease    RAYNAUD'S DISEASE 12/24/2006   Qualifier: Diagnosis of  By: Sherren Mocha RN, Dorian Pod     Special screening for malignant neoplasm of colon     Social History   Socioeconomic History   Marital status: Married    Spouse name: Not on file   Number of children: Not on file   Years of education: Not on file   Highest education level: Not on file  Occupational History   Not on file  Tobacco Use   Smoking status: Former    Packs/day: 0.50    Years: 20.00    Total pack years: 10.00    Types: Cigarettes    Quit date: 09/01/1983    Years since quitting: 38.2   Smokeless tobacco: Never  Vaping Use   Vaping Use: Never used  Substance and Sexual Activity   Alcohol use: No    Alcohol/week: 0.0 standard drinks of alcohol   Drug use: No   Sexual activity: Yes    Birth control/protection: None    Comment: Married  Other Topics Concern   Not on file  Social History Narrative   Retired Engineer, structural   Married for 73 years - husband has multiple medical issues and is disabled   4 children   Grew up in [place] Strain: [place]  (02/14/2021)   Overall Financial Resource Strain (CARDIA)    Difficulty of Paying Living Expenses: Not hard at all  Food Insecurity: No Food Insecurity (02/14/2021)   Hunger Vital Sign    Worried About Running Out of Food in the Last Year: Never true    [place] in the Last Year: Never true  Transportation Needs: No Transportation Needs (02/14/2021)   PRAPARE - Hydrologist (Medical): No    Lack of Transportation (Non-Medical): No  Physical Activity:  Insufficiently Active (02/14/2021)   Exercise Vital Sign    Days of Exercise per Week: 3 days    Minutes of Exercise per Session: 30 min  Stress: No Stress Concern Present (02/14/2021)   [place]    Feeling of Stress : Not at all  Social Connections: Moderately Integrated (02/14/2021)   Social Connection and Isolation Panel [NHANES]    Frequency of Communication with Friends and Family: Twice a week    Frequency of Social Gatherings with Friends and Family: Twice a week    Attends Religious Services: 1 to 4 times per year    Active Member of [employer] or Organizations: No    Attends Archivist Meetings: Never    Marital Status: Married  Human resources officer Violence: Not At Risk (02/14/2021)   Humiliation, Afraid, Rape, and Kick questionnaire    Fear of Current or Ex-Partner: No    Emotionally Abused: No    Physically Abused: No    Sexually Abused: No    Past Surgical History:  Procedure Laterality Date   BREAST EXCISIONAL BIOPSY Left    CORONARY STENT PLACEMENT     2 prior drug eluting stent placed in the LAD and right coronary artery     Family History  Problem Relation Age of Onset   CAD Brother    Stroke Father    CAD Mother    Breast cancer Neg Hx     Allergies  Allergen Reactions   Celexa [Citalopram]     Confusion     Codeine Sulfate     REACTION: sensitivity to    Current Outpatient Medications on File Prior to Visit  Medication Sig Dispense Refill   alendronate (FOSAMAX) 70 MG tablet Take 70 mg by mouth once a week.  11   amLODipine (NORVASC) 2.5 MG tablet TAKE 1 TABLET BY MOUTH EVERY DAY 90 tablet 1   aspirin 81 MG tablet Take 81 mg by mouth daily.     atorvastatin (LIPITOR) 20 MG tablet Take 1 tablet (20 mg total) by mouth daily at 6 PM. 90 tablet 2   benzonatate (TESSALON PERLES) 100 MG capsule Take 1 capsule (100 mg total) by mouth 3 (three) times daily as needed. 20 capsule 0    calcium citrate-vitamin D (CITRACAL+D) 315-200 MG-UNIT per tablet Take 2 tablets by mouth daily.      Cholecalciferol (VITAMIN D3) 1000 UNITS CAPS Take 1 tablet by mouth daily.       cyclobenzaprine (FLEXERIL) 5 MG tablet TAKE 1 TABLET BY MOUTH EVERYDAY AT BEDTIME 15 tablet 0   ezetimibe (ZETIA)  10 MG tablet TAKE 1 TABLET BY MOUTH EVERY DAY 30 tablet 11   ketoconazole (NIZORAL) 2 % shampoo Apply topically 2 (two) times a week.     meloxicam (MOBIC) 15 MG tablet TAKE 1 (ONE) TABLET DAILY AS NEEDED FOR PAIN 90 tablet 0   methylPREDNISolone (MEDROL DOSEPAK) 4 MG TBPK tablet Take as directed 21 tablet 0   metroNIDAZOLE (METROCREAM) 0.75 % cream Apply topically 2 (two) times daily.     omeprazole (PRILOSEC) 20 MG capsule Take 1 capsule (20 mg total) by mouth daily. 90 capsule 3   SHINGRIX injection      No current facility-administered medications on file prior to visit.    BP 132/70   Pulse 87   Temp 97.9 F (36.6 C) (Oral)   Ht '4\' 10"'$  (1.473 m)   Wt 90 lb (40.8 kg)   SpO2 100%   BMI 18.81 kg/m       Objective:   Physical Exam Vitals and nursing note reviewed.  Constitutional:      Appearance: Normal appearance.  Cardiovascular:     Rate and Rhythm: Normal rate and regular rhythm.     Pulses: Normal pulses.     Heart sounds: Normal heart sounds.  Pulmonary:     Effort: Pulmonary effort is normal.     Breath sounds: Normal breath sounds.  Skin:    General: Skin is warm and dry.  Neurological:     General: No focal deficit present.     Mental Status: She is alert and oriented to person, place, and time.  Psychiatric:        Mood and Affect: Mood normal.        Behavior: Behavior normal.        Thought Content: Thought content normal.        Judgment: Judgment normal.        Assessment & Plan:  1. Cognitive impairment    12/05/2021    8:00 AM  MMSE - Mini Mental State Exam  Orientation to time 5  Orientation to Place 5  Registration 3  Attention/ Calculation 3   Recall 0  Language- name 2 objects 2  Language- repeat 1  Language- follow 3 step command 3  Language- read & follow direction 1  Write a sentence 1  Copy design 1  Total score 25  - Will hold off on MRI and further testing now - Check blood work today  - She plans on working on herself now and will join a gym - Will have her follow up in one month to see how she is doing    - Vitamin B12; Future - VITAMIN D 25 Hydroxy (Vit-D Deficiency, Fractures); Future - TSH; Future - IBC + Ferritin; Future - CBC with Differential/Platelet; Future - Comprehensive metabolic panel; Future - Comprehensive metabolic panel - CBC with Differential/Platelet - IBC + Ferritin - TSH - VITAMIN D 25 Hydroxy (Vit-D Deficiency, Fractures) - Vitamin B12  2. Urinary frequency  - POC Urinalysis Dipstick- negative for infection - Will send culture to r/o UTI completely - Culture, Urine; Future - Culture, Urine  3. Depression, recurrent ([place]) [place] Office Visit from 12/04/2021 in [facility]  PHQ-9 Total Score 9     - she would like to hold off on antidepressants for the time being.  - She is going to join a gym close to her house  4. Grief reaction - Discussed grieving process and it is ok for her to  still be grieving. I would like for her to get out of the house more often though, look at volunteer work or a new hobby that she has always wanted to do   Time spent with patient today was 42 minutes which consisted of chart review, discussing depression, cognitive impairment, grief from death of loved one work up, treatment , listening and answering questions as well as documentation.

## 2021-12-05 LAB — CBC WITH DIFFERENTIAL/PLATELET
Basophils Absolute: 0.1 10*3/uL (ref 0.0–0.1)
Basophils Relative: 1.4 % (ref 0.0–3.0)
Eosinophils Absolute: 0.1 10*3/uL (ref 0.0–0.7)
Eosinophils Relative: 0.7 % (ref 0.0–5.0)
HCT: 37.2 % (ref 36.0–46.0)
Hemoglobin: 12 g/dL (ref 12.0–15.0)
Lymphocytes Relative: 22.8 % (ref 12.0–46.0)
Lymphs Abs: 2.3 10*3/uL (ref 0.7–4.0)
MCHC: 32.4 g/dL (ref 30.0–36.0)
MCV: 84.5 fl (ref 78.0–100.0)
Monocytes Absolute: 0.7 10*3/uL (ref 0.1–1.0)
Monocytes Relative: 7.3 % (ref 3.0–12.0)
Neutro Abs: 6.8 10*3/uL (ref 1.4–7.7)
Neutrophils Relative %: 67.8 % (ref 43.0–77.0)
Platelets: 435 10*3/uL — ABNORMAL HIGH (ref 150.0–400.0)
RBC: 4.4 Mil/uL (ref 3.87–5.11)
RDW: 16.2 % — ABNORMAL HIGH (ref 11.5–15.5)
WBC: 10 10*3/uL (ref 4.0–10.5)

## 2021-12-05 LAB — COMPREHENSIVE METABOLIC PANEL
ALT: 19 U/L (ref 0–35)
AST: 31 U/L (ref 0–37)
Albumin: 4.4 g/dL (ref 3.5–5.2)
Alkaline Phosphatase: 45 U/L (ref 39–117)
BUN: 24 mg/dL — ABNORMAL HIGH (ref 6–23)
CO2: 24 mEq/L (ref 19–32)
Calcium: 10.1 mg/dL (ref 8.4–10.5)
Chloride: 101 mEq/L (ref 96–112)
Creatinine, Ser: 0.99 mg/dL (ref 0.40–1.20)
GFR: 51.3 mL/min — ABNORMAL LOW (ref >60.00)
Glucose, Bld: 89 mg/dL (ref 70–99)
Potassium: 4 mEq/L (ref 3.5–5.1)
Sodium: 136 mEq/L (ref 135–145)
Total Bilirubin: 0.6 mg/dL (ref 0.2–1.2)
Total Protein: 7.7 g/dL (ref 6.0–8.3)

## 2021-12-05 LAB — IBC + FERRITIN
Ferritin: 12.3 ng/mL (ref 10.0–291.0)
Iron: 58 ug/dL (ref 42–145)
Saturation Ratios: 12 % — ABNORMAL LOW (ref 20.0–50.0)
TIBC: 481.6 ug/dL — ABNORMAL HIGH (ref 250.0–450.0)
Transferrin: 344 mg/dL (ref 212.0–360.0)

## 2021-12-05 LAB — TSH: TSH: 1.47 u[IU]/mL (ref 0.35–5.50)

## 2021-12-05 LAB — VITAMIN B12: Vitamin B-12: 530 pg/mL (ref 211–911)

## 2021-12-05 LAB — VITAMIN D 25 HYDROXY (VIT D DEFICIENCY, FRACTURES): VITD: 52.43 ng/mL (ref 30.00–100.00)

## 2021-12-07 LAB — URINE CULTURE
MICRO NUMBER:: 13821923
SPECIMEN QUALITY:: ADEQUATE

## 2021-12-10 ENCOUNTER — Other Ambulatory Visit: Payer: Self-pay | Admitting: Adult Health

## 2021-12-10 MED ORDER — CIPROFLOXACIN HCL 500 MG PO TABS: 500.0000 mg | ORAL_TABLET | Freq: Two times a day (BID) | ORAL | 0 refills | Status: AC

## 2021-12-12 ENCOUNTER — Other Ambulatory Visit: Payer: Self-pay | Admitting: Adult Health

## 2021-12-30 ENCOUNTER — Encounter: Payer: Self-pay | Admitting: Adult Health

## 2022-01-21 ENCOUNTER — Other Ambulatory Visit: Payer: Self-pay | Admitting: Nurse Practitioner

## 2022-01-21 DIAGNOSIS — N952 Postmenopausal atrophic vaginitis: Secondary | ICD-10-CM | POA: Diagnosis not present

## 2022-01-21 DIAGNOSIS — Z9889 Other specified postprocedural states: Secondary | ICD-10-CM | POA: Diagnosis not present

## 2022-01-21 DIAGNOSIS — Z9189 Other specified personal risk factors, not elsewhere classified: Secondary | ICD-10-CM | POA: Diagnosis not present

## 2022-01-21 DIAGNOSIS — M810 Age-related osteoporosis without current pathological fracture: Secondary | ICD-10-CM | POA: Diagnosis not present

## 2022-01-21 DIAGNOSIS — Z8739 Personal history of other diseases of the musculoskeletal system and connective tissue: Secondary | ICD-10-CM

## 2022-01-21 DIAGNOSIS — Z1231 Encounter for screening mammogram for malignant neoplasm of breast: Secondary | ICD-10-CM

## 2022-02-01 ENCOUNTER — Other Ambulatory Visit: Payer: Self-pay | Admitting: Internal Medicine

## 2022-02-06 ENCOUNTER — Other Ambulatory Visit: Payer: Self-pay | Admitting: Adult Health

## 2022-02-06 DIAGNOSIS — M159 Polyosteoarthritis, unspecified: Secondary | ICD-10-CM

## 2022-02-18 ENCOUNTER — Ambulatory Visit (INDEPENDENT_AMBULATORY_CARE_PROVIDER_SITE_OTHER): Payer: PPO

## 2022-02-18 VITALS — Ht <= 58 in | Wt 94.0 lb

## 2022-02-18 DIAGNOSIS — Z0000 Encounter for general adult medical examination without abnormal findings: Secondary | ICD-10-CM

## 2022-02-18 NOTE — Patient Instructions (Addendum)
Jennifer Donaldson , Thank you for taking time to come for your Medicare Wellness Visit. I appreciate your ongoing commitment to your health goals. Please review the following plan we discussed and let me know if I can assist you in the future.   These are the goals we discussed:  Goals       Exercise 150 min/wk Moderate Activity      Go to the [facility] center more often if you can get some relief  Get help with organized       Increase physical activity (pt-stated)      Get my legs stronger        This is a list of the screening recommended for you and due dates:  Health Maintenance  Topic Date Due   COVID-19 Vaccine (3 - Moderna series) 03/06/2022*   Tetanus Vaccine  02/19/2023*   Medicare Annual Wellness Visit  02/19/2023   Pneumonia Vaccine  Completed   Flu Shot  Completed   DEXA scan (bone density measurement)  Completed   HPV Vaccine  Aged Out   Zoster (Shingles) Vaccine  Discontinued  *Topic was postponed. The date shown is not the original due date.    Advanced directives: Please bring a copy of your health care power of attorney and living will to the office to be added to your chart at your convenience.   Conditions/risks identified: None  Next appointment: Follow up in one year for your annual wellness visit     Preventive Care 65 Years and Older, Female Preventive care refers to lifestyle choices and visits with your health care provider that can promote health and wellness. What does preventive care include? A yearly physical exam. This is also called an annual well check. Dental exams once or twice a year. Routine eye exams. Ask your health care provider how often you should have your eyes checked. Personal lifestyle choices, including: Daily care of your teeth and gums. Regular physical activity. Eating a healthy diet. Avoiding tobacco and drug use. Limiting alcohol use. Practicing safe sex. Taking low-dose aspirin every day. Taking vitamin and mineral  supplements as recommended by your health care provider. What happens during an annual well check? The services and screenings done by your health care provider during your annual well check will depend on your age, overall health, lifestyle risk factors, and family history of disease. Counseling  Your health care provider may ask you questions about your: Alcohol use. Tobacco use. Drug use. Emotional well-being. Home and relationship well-being. Sexual activity. Eating habits. History of falls. Memory and ability to understand (cognition). Work and work Statistician. Reproductive health. Screening  You may have the following tests or measurements: Height, weight, and BMI. Blood pressure. Lipid and cholesterol levels. These may be checked every 5 years, or more frequently if you are over 65 years old. Skin check. Lung cancer screening. You may have this screening every year starting at age 55 if you have a 30-pack-year history of smoking and currently smoke or have quit within the past 15 years. Fecal occult blood test (FOBT) of the stool. You may have this test every year starting at age 51. Flexible sigmoidoscopy or colonoscopy. You may have a sigmoidoscopy every 5 years or a colonoscopy every 10 years starting at age 2. Hepatitis C blood test. Hepatitis B blood test. Sexually transmitted disease (STD) testing. Diabetes screening. This is done by checking your blood sugar (glucose) after you have not eaten for a while (fasting). You may have this  done every 1-3 years. Bone density scan. This is done to screen for osteoporosis. You may have this done starting at age 26. Mammogram. This may be done every 1-2 years. Talk to your health care provider about how often you should have regular mammograms. Talk with your health care provider about your test results, treatment options, and if necessary, the need for more tests. Vaccines  Your health care provider may recommend certain  vaccines, such as: Influenza vaccine. This is recommended every year. Tetanus, diphtheria, and acellular pertussis (Tdap, Td) vaccine. You may need a Td booster every 10 years. Zoster vaccine. You may need this after age 64. Pneumococcal 13-valent conjugate (PCV13) vaccine. One dose is recommended after age 92. Pneumococcal polysaccharide (PPSV23) vaccine. One dose is recommended after age 38. Talk to your health care provider about which screenings and vaccines you need and how often you need them. This information is not intended to replace advice given to you by your health care provider. Make sure you discuss any questions you have with your health care provider. Document Released: 04/27/2015 Document Revised: 12/19/2015 Document Reviewed: 01/30/2015 Elsevier Interactive Patient Education  2017 [employer] Prevention in the Home Falls can cause injuries. They can happen to people of all ages. There are many things you can do to make your home safe and to help prevent falls. What can I do on the outside of my home? Regularly fix the edges of walkways and driveways and fix any cracks. Remove anything that might make you trip as you walk through a door, such as a raised step or threshold. Trim any bushes or trees on the path to your home. Use bright outdoor lighting. Clear any walking paths of anything that might make someone trip, such as rocks or tools. Regularly check to see if handrails are loose or broken. Make sure that both sides of any steps have handrails. Any raised decks and porches should have guardrails on the edges. Have any leaves, snow, or ice cleared regularly. Use sand or salt on walking paths during winter. Clean up any spills in your garage right away. This includes oil or grease spills. What can I do in the bathroom? Use night lights. Install grab bars by the toilet and in the tub and shower. Do not use towel bars as grab bars. Use non-skid mats or decals in  the tub or shower. If you need to sit down in the shower, use a plastic, non-slip stool. Keep the floor dry. Clean up any water that spills on the floor as soon as it happens. Remove soap buildup in the tub or shower regularly. Attach bath mats securely with double-sided non-slip rug tape. Do not have throw rugs and other things on the floor that can make you trip. What can I do in the bedroom? Use night lights. Make sure that you have a light by your bed that is easy to reach. Do not use any sheets or blankets that are too big for your bed. They should not hang down onto the floor. Have a firm chair that has side arms. You can use this for support while you get dressed. Do not have throw rugs and other things on the floor that can make you trip. What can I do in the kitchen? Clean up any spills right away. Avoid walking on wet floors. Keep items that you use a lot in easy-to-reach places. If you need to reach something above you, use a strong step stool that  has a grab bar. Keep electrical cords out of the way. Do not use floor polish or wax that makes floors slippery. If you must use wax, use non-skid floor wax. Do not have throw rugs and other things on the floor that can make you trip. What can I do with my stairs? Do not leave any items on the stairs. Make sure that there are handrails on both sides of the stairs and use them. Fix handrails that are broken or loose. Make sure that handrails are as long as the stairways. Check any carpeting to make sure that it is firmly attached to the stairs. Fix any carpet that is loose or worn. Avoid having throw rugs at the top or bottom of the stairs. If you do have throw rugs, attach them to the floor with carpet tape. Make sure that you have a light switch at the top of the stairs and the bottom of the stairs. If you do not have them, ask someone to add them for you. What else can I do to help prevent falls? Wear shoes that: Do not have high  heels. Have rubber bottoms. Are comfortable and fit you well. Are closed at the toe. Do not wear sandals. If you use a stepladder: Make sure that it is fully opened. Do not climb a closed stepladder. Make sure that both sides of the stepladder are locked into place. Ask someone to hold it for you, if possible. Clearly mark and make sure that you can see: Any grab bars or handrails. First and last steps. Where the edge of each step is. Use tools that help you move around (mobility aids) if they are needed. These include: Canes. Walkers. Scooters. Crutches. Turn on the lights when you go into a dark area. Replace any light bulbs as soon as they burn out. Set up your furniture so you have a clear path. Avoid moving your furniture around. If any of your floors are uneven, fix them. If there are any pets around you, be aware of where they are. Review your medicines with your doctor. Some medicines can make you feel dizzy. This can increase your chance of falling. Ask your doctor what other things that you can do to help prevent falls. This information is not intended to replace advice given to you by your health care provider. Make sure you discuss any questions you have with your health care provider. Document Released: 01/25/2009 Document Revised: 09/06/2015 Document Reviewed: 05/05/2014 Elsevier Interactive Patient Education  2017 [employer].

## 2022-02-18 NOTE — Progress Notes (Signed)
Subjective:   Jennifer Donaldson is a 88 y.o. female who presents for Medicare Annual (Subsequent) preventive examination.  Review of Systems    Virtual Visit via Telephone Note  I connected with  Jennifer Donaldson on 02/18/22 at  1:30 PM EST by telephone and verified that I am speaking with the correct person using two identifiers.  Location: Patient: Home Provider: Office Persons participating in the virtual visit: patient/Nurse Health Advisor   I discussed the limitations, risks, security and privacy concerns of performing an evaluation and management service by telephone and the availability of in person appointments. The patient expressed understanding and agreed to proceed.  Interactive audio and video telecommunications were attempted between this nurse and patient, however failed, due to patient having technical difficulties OR patient did not have access to video capability.  We continued and completed visit with audio only.  Some vital signs may be absent or patient reported.   Criselda Peaches, LPN  Cardiac Risk Factors include: advanced age (>11mn, >>29women);Other (see comment), Risk factor comments: CAD     Objective:    Today's Vitals   02/18/22 1338  Weight: 94 lb (42.6 kg)  Height: '4\' 10"'$  (1.473 m)   Body mass index is 19.65 kg/m.     02/18/2022    1:50 PM 02/14/2021    3:25 PM 02/13/2020   11:31 AM 07/17/2017    1:39 PM  Advanced Directives  Does Patient Have a Medical Advance Directive? Yes Yes Yes No  Type of [place]aramed[place]of [place]Living will [place] will [place] will   Does patient want to make changes to medical advance directive?   No - Patient declined   Copy of H[place] Chart? No - copy requested No - copy requested No - copy requested     Current Medications (verified) Outpatient Encounter Medications as of 02/18/2022  Medication Sig    alendronate (FOSAMAX) 70 MG tablet Take 70 mg by mouth once a week.   amLODipine (NORVASC) 2.5 MG tablet TAKE 1 TABLET BY MOUTH EVERY DAY   aspirin 81 MG tablet Take 81 mg by mouth daily.   atorvastatin (LIPITOR) 20 MG tablet Take 1 tablet (20 mg total) by mouth daily at 6 PM.   benzonatate (TESSALON PERLES) 100 MG capsule Take 1 capsule (100 mg total) by mouth 3 (three) times daily as needed.   calcium citrate-vitamin D (CITRACAL+D) 315-200 MG-UNIT per tablet Take 2 tablets by mouth daily.    Cholecalciferol (VITAMIN D3) 1000 UNITS CAPS Take 1 tablet by mouth daily.     cyclobenzaprine (FLEXERIL) 5 MG tablet TAKE 1 TABLET BY MOUTH EVERYDAY AT BEDTIME   ezetimibe (ZETIA) 10 MG tablet TAKE 1 TABLET BY MOUTH EVERY DAY   ketoconazole (NIZORAL) 2 % shampoo Apply topically 2 (two) times a week.   meloxicam (MOBIC) 15 MG tablet TAKE 1 (ONE) TABLET DAILY AS NEEDED FOR PAIN   methylPREDNISolone (MEDROL DOSEPAK) 4 MG TBPK tablet Take as directed   metroNIDAZOLE (METROCREAM) 0.75 % cream Apply topically 2 (two) times daily.   omeprazole (PRILOSEC) 20 MG capsule Take 1 capsule (20 mg total) by mouth daily.   SHINGRIX injection    No facility-administered encounter medications on file as of 02/18/2022.    Allergies (verified) Celexa [citalopram] and Codeine sulfate   History: Past Medical History:  Diagnosis Date   C. difficile colitis    CAD (coronary artery disease)  Dyslipidemia 12/24/2006   Qualifier: Diagnosis of  By: Sherren Mocha RN, Ellen     Hyperlipidemia    Mitral regurgitation    MITRAL REGURGITATION 08/12/2008   Qualifier: Diagnosis of  By: Burnett Kanaris     Osteopenia    Osteoporosis 03/01/2014   Raynaud's disease    RAYNAUD'S DISEASE 12/24/2006   Qualifier: Diagnosis of  By: Sherren Mocha RN, Dorian Pod     Special screening for malignant neoplasm of colon    Past Surgical History:  Procedure Laterality Date   BREAST EXCISIONAL BIOPSY Left    CORONARY STENT PLACEMENT     2 prior drug  eluting stent placed in the LAD and right coronary artery    Family History  Problem Relation Age of Onset   CAD Brother    Stroke Father    CAD Mother    Breast cancer Neg Hx    Social History   Socioeconomic History   Marital status: Married    Spouse name: Not on file   Number of children: Not on file   Years of education: Not on file   Highest education level: Not on file  Occupational History   Not on file  Tobacco Use   Smoking status: Former    Packs/day: 0.50    Years: 20.00    Total pack years: 10.00    Types: Cigarettes    Quit date: 09/01/1983    Years since quitting: 38.4   Smokeless tobacco: Never  Vaping Use   Vaping Use: Never used  Substance and Sexual Activity   Alcohol use: No    Alcohol/week: 0.0 standard drinks of alcohol   Drug use: No   Sexual activity: Yes    Birth control/protection: None    Comment: Married  Other Topics Concern   Not on file  Social History Narrative   ** Merged History Encounter **       Retired Engineer, structural Married for 13 years - husband has multiple medical issues and is disabled 4 children Grew up in [place] Strain: [place]  (02/18/2022)   Overall Financial Resource Strain (CARDIA)    Difficulty of Paying Living Expenses: Not hard at all  Food Insecurity: No [place] (02/18/2022)   Hunger Vital Sign    Worried About Running Out of Food in the Last Year: Never true    [place] in the Last Year: Never true  Transportation Needs: No Transportation Needs (02/18/2022)   PRAPARE - Hydrologist (Medical): No    Lack of Transportation (Non-Medical): No  Physical Activity: Insufficiently Active (02/18/2022)   Exercise Vital Sign    Days of Exercise per Week: 1 day    Minutes of Exercise per Session: 30 min  Stress: No Stress Concern Present (02/18/2022)   [place]    Feeling of Stress : Not at all  Social Connections: Unknown (02/18/2022)   Social Connection and Isolation Panel [NHANES]    Frequency of Communication with Friends and Family: More than three times a week    Frequency of Social Gatherings with Friends and Family: More than three times a week    Attends Religious Services: More than 4 times per year    Active Member of [employer] or Organizations: Yes    Attends Archivist Meetings: More than 4 times per year    Marital Status:  Not on file    Tobacco Counseling Counseling given: Not Answered   Clinical Intake:  Pre-visit preparation completed: No  Pain : No/denies pain     BMI - recorded: 19.65 Nutritional Status: BMI of 19-24  Normal Nutritional Risks: None Diabetes: No  How often do you need to have someone help you when you read instructions, pamphlets, or other written materials from your doctor or pharmacy?: 2 - Rarely (Daughter assist)  Diabetic? No  Interpreter Needed?: No  Information entered by :: Rolene Arbour LPN   Activities of Daily Living    02/18/2022    1:46 PM  In your present state of health, do you have any difficulty performing the following activities:  Hearing? 0  Vision? 0  Difficulty concentrating or making decisions? 0  Walking or climbing stairs? 0  Dressing or bathing? 0  Doing errands, shopping? 0  Preparing Food and eating ? N  Using the Toilet? N  In the past six months, have you accidently leaked urine? N  Comment Wears pads on occasions  Do you have problems with loss of bowel control? N  Managing your Medications? N  Managing your Finances? N  Housekeeping or managing your Housekeeping? N    Patient Care Team: Dorothyann Peng, NP as PCP - General (Family Medicine) Fay Records, MD as PCP - Cardiology (Cardiology) Earnie Larsson, [facility] (Pharmacist)  Indicate any recent Medical Services you may have received from other than [facility] providers in  the past year (date may be approximate).     Assessment:   This is a routine wellness examination for [place].  Hearing/Vision screen Hearing Screening - Comments:: Denies hearing difficulties   Vision Screening - Comments:: Wears rx glasses - up to date with routine eye exams with  Dr Bing Plume  Dietary issues and exercise activities discussed: Exercise limited by: None identified   Goals Addressed               This Visit's Progress     Increase physical activity (pt-stated)        Get my legs stronger       Depression Screen    02/18/2022    1:44 PM 12/04/2021    2:39 PM 04/25/2021    2:03 PM 02/14/2021    3:24 PM 02/13/2020   11:35 AM 01/18/2020    1:37 PM 11/05/2017   10:33 AM  PHQ 2/9 Scores  PHQ - 2 Score 0 4 2 0 0 0 0  PHQ- 9 Score  9 11  0      Fall Risk    02/18/2022    1:47 PM 04/25/2021    1:23 PM 02/13/2020   11:33 AM 03/09/2019   10:31 AM 11/05/2017   10:33 AM  Fall Risk   Falls in the past year? 1 1 0 0 No  Comment    Emmi Telephone Survey: data to providers prior to load   Number falls in past yr: 1 1 0    Injury with Fall? 0 0 0    Comment No injury or medical attention needed      Risk for fall due to : Impaired balance/gait  No Fall Risks    Follow up Falls prevention discussed  Falls evaluation completed;Falls prevention discussed      FALL RISK PREVENTION PERTAINING TO THE HOME:  Any stairs in or around the home? Yes  If so, are there any without handrails? No  Home free of loose throw rugs in walkways,  pet beds, electrical cords, etc? Yes  Adequate lighting in your home to reduce risk of falls? Yes   ASSISTIVE DEVICES UTILIZED TO PREVENT FALLS:  Life alert? No  Use of a cane, walker or w/c? No  Grab bars in the bathroom? Yes  Shower chair or bench in shower? Yes Elevated toilet seat or a handicapped toilet? No   TIMED UP AND GO:  Was the test performed? No . Audio Visit   Cognitive Function:    12/05/2021    8:00 AM  MMSE - Mini  Mental State Exam  Orientation to time 5  Orientation to Place 5  Registration 3  Attention/ Calculation 3  Recall 0  Language- name 2 objects 2  Language- repeat 1  Language- follow 3 step command 3  Language- read & follow direction 1  Write a sentence 1  Copy design 1  Total score 25        02/18/2022    1:50 PM 02/13/2020   11:41 AM  6CIT Screen  What Year? 0 points 0 points  What month? 0 points 0 points  What time? 0 points 0 points  Count back from 20 2 points 0 points  Months in reverse 2 points 4 points  Repeat phrase 0 points 6 points  Total Score 4 points 10 points    Immunizations Immunization History  Administered Date(s) Administered   Fluad Quad(high Dose 65+) 01/17/2019, 01/28/2022   Influenza Split 01/22/2011, 02/11/2012   Influenza Whole 01/26/2008, 02/06/2009, 01/04/2010   Influenza, High Dose Seasonal PF 12/29/2014, 02/06/2016, 02/04/2017, 02/09/2018   Influenza,inj,Quad PF,6+ Mos 12/14/2012, 01/17/2014, 01/31/2021   Moderna Sars-Covid-2 Vaccination 04/15/2019, 05/16/2019   Pneumococcal Conjugate-13 01/17/2014   Pneumococcal Polysaccharide-23 12/06/1999   Td 04/14/1998, 09/20/2008   Zoster Recombinat (Shingrix) 02/21/2021   Zoster, Live 04/15/2007    TDAP status: Up to date  Flu Vaccine status: Up to date  Pneumococcal vaccine status: Up to date  Covid-19 vaccine status: Completed vaccines  Qualifies for Shingles Vaccine? Yes   Zostavax completed Yes   Shingrix Completed?: Yes  Screening Tests Health Maintenance  Topic Date Due   COVID-19 Vaccine (3 - Moderna series) 03/06/2022 (Originally 07/11/2019)   TETANUS/TDAP  02/19/2023 (Originally 09/21/2018)   Medicare Annual Wellness (AWV)  02/19/2023   Pneumonia Vaccine 53+ Years old  Completed   INFLUENZA VACCINE  Completed   DEXA SCAN  Completed   HPV VACCINES  Aged Out   Zoster Vaccines- Shingrix  Discontinued    Health Maintenance  There are no preventive care reminders to display  for this patient.   Colorectal cancer screening: No longer required.   Mammogram status: No longer required due to Age.  Bone Density status: Ordered Scheduled for 07/03/22. Pt provided with contact info and advised to call to schedule appt.  Lung Cancer Screening: (Low Dose CT Chest recommended if Age 21-80 years, 30 pack-year currently smoking OR have quit w/in 15years.) does not qualify.     Additional Screening:  Hepatitis C Screening: does not qualify; Completed   Vision Screening: Recommended annual ophthalmology exams for early detection of glaucoma and other disorders of the eye. Is the patient up to date with their annual eye exam?  Yes  Who is the provider or what is the name of the office in which the patient attends annual eye exams? Dr Bing Plume If pt is not established with a provider, would they like to be referred to a provider to establish care? No .   Dental  Screening: Recommended annual dental exams for proper oral hygiene  Community Resource Referral / Chronic Care Management:  CRR required this visit?  No   CCM required this visit?  No      Plan:     I have personally reviewed and noted the following in the patient's chart:   Medical and social history Use of alcohol, tobacco or illicit drugs  Current medications and supplements including opioid prescriptions. Patient is not currently taking opioid prescriptions. Functional ability and status Nutritional status Physical activity Advanced directives List of other physicians Hospitalizations, surgeries, and ER visits in previous 12 months Vitals Screenings to include cognitive, depression, and falls Referrals and appointments  In addition, I have reviewed and discussed with patient certain preventive protocols, quality metrics, and best practice recommendations. A written personalized care plan for preventive services as well as general preventive health recommendations were provided to patient.      Criselda Peaches, LPN   16/12/6787   Nurse Notes: None

## 2022-02-25 ENCOUNTER — Ambulatory Visit
Admission: RE | Admit: 2022-02-25 | Discharge: 2022-02-25 | Disposition: A | Discharge status: Home / Self Care | Payer: PPO | Source: Ambulatory Visit | Attending: Nurse Practitioner | Admitting: Nurse Practitioner

## 2022-02-25 DIAGNOSIS — Z1231 Encounter for screening mammogram for malignant neoplasm of breast: Secondary | ICD-10-CM

## 2022-03-06 ENCOUNTER — Other Ambulatory Visit: Payer: Self-pay | Admitting: Internal Medicine

## 2022-03-11 NOTE — Progress Notes (Unsigned)
Cardiology Office Note   Date:  03/12/2022   ID:  Jennifer Donaldson, DOB 04-17-1932, MRN [id number]  PCP:  Dorothyann Peng, NP  Cardiologist:   Dorris Carnes, MD   Patinet presents for f/u of CAD     History of Present Illness: Jennifer Donaldson is a 88 y.o. female with a history of CAD  She wa previously seen by T Stuckey  Pt is s/p PCI/DES to LAD and RCA  Nuclear scan in 2013 was normal   I last saw the pt in clinic in Oct 03, 2019   Jennifer Donaldson after visit showed no ischemia  The pt says her breathing is OK  She denies CP   No edema   NO palpitatoins   Patient remains under inceased sterss with husband  who she lives with at home   He has singificant dementia     I sawa the pt in 2022  Since seen she [name]  Breathing is OK  No dizziness  no palpitaitons    Her husband died in October 02, 2021   She is still grieving some     Outpatient Medications Prior to Visit  Medication Sig Dispense Refill   alendronate (FOSAMAX) 70 MG tablet Take 70 mg by mouth once a week.  11   amLODipine (NORVASC) 2.5 MG tablet TAKE 1 TABLET BY MOUTH EVERY DAY 90 tablet 1   aspirin 81 MG tablet Take 81 mg by mouth daily.     atorvastatin (LIPITOR) 20 MG tablet TAKE 1 TABLET BY MOUTH DAILY AT 6 PM. 90 tablet 2   calcium citrate-vitamin D (CITRACAL+D) 315-200 MG-UNIT per tablet Take 2 tablets by mouth daily.      Cholecalciferol (VITAMIN D3) 1000 UNITS CAPS Take 1 tablet by mouth daily.       ezetimibe (ZETIA) 10 MG tablet TAKE 1 TABLET BY MOUTH EVERY DAY 90 tablet 0   ketoconazole (NIZORAL) 2 % shampoo Apply topically 2 (two) times a week.     meloxicam (MOBIC) 15 MG tablet TAKE 1 (ONE) TABLET DAILY AS NEEDED FOR PAIN 90 tablet 0   metroNIDAZOLE (METROCREAM) 0.75 % cream Apply topically 2 (two) times daily.     omeprazole (PRILOSEC) 20 MG capsule Take 1 capsule (20 mg total) by mouth daily. 90 capsule 3   cyclobenzaprine (FLEXERIL) 5 MG tablet TAKE 1 TABLET BY MOUTH EVERYDAY AT BEDTIME (Patient  not taking: Reported on 03/12/2022) 15 tablet 0   benzonatate (TESSALON PERLES) 100 MG capsule Take 1 capsule (100 mg total) by mouth 3 (three) times daily as needed. (Patient not taking: Reported on 03/12/2022) 20 capsule 0   methylPREDNISolone (MEDROL DOSEPAK) 4 MG TBPK tablet Take as directed (Patient not taking: Reported on 03/12/2022) 21 tablet 0   SHINGRIX injection  (Patient not taking: Reported on 03/12/2022)     No facility-administered medications prior to visit.     Allergies:   Celexa [citalopram] and Codeine sulfate   Past Medical History:  Diagnosis Date   C. difficile colitis    CAD (coronary artery disease)    Dyslipidemia 12/24/2006   Qualifier: Diagnosis of  By: Sherren Mocha RN, Ellen     Hyperlipidemia    Mitral regurgitation    MITRAL REGURGITATION 08/12/2008   Qualifier: Diagnosis of  By: Burnett Kanaris     Osteopenia    Osteoporosis 03/01/2014   Raynaud's disease    RAYNAUD'S DISEASE 12/24/2006   Qualifier: Diagnosis of  By: Scherrie Gerlach     Special  screening for malignant neoplasm of colon     Past Surgical History:  Procedure Laterality Date   BREAST EXCISIONAL BIOPSY Left    CORONARY STENT PLACEMENT     2 prior drug eluting stent placed in the LAD and right coronary artery      Social History:  The patient  reports that she quit smoking about 38 years ago. Her smoking use included cigarettes. She has a 10.00 pack-year smoking history. She has never used smokeless tobacco. She reports that she does not drink alcohol and does not use drugs.   Family History:  The patient's family history includes CAD in her brother and mother; Stroke in her father.    ROS:  Please see the history of present illness. All other systems are reviewed and  Negative to the above problem except as noted.    PHYSICAL EXAM: VS:  BP (!) 144/70   Pulse 81   Ht '4\' 10"'$  (1.473 m)   Wt 97 lb 6.4 oz (44.2 kg)   SpO2 92%   BMI 20.36 kg/m   GEN  Thin 48d yo n no acute distress   HEENT: normal  Neck: JVP is normal   No carotid bruits Cardiac: RRR; no murmurs  No lower extremity  edema Respiratory:  clear to auscultation bilaterally,  GI: soft, nontender, nondistended, + BS  No hepatomegaly  MS: no deformity Moving all extremities   Skin: warm and dry, no rash Neuro:  Strength and sensation are intact Psych: euthymic mood, full affect    EKG:  EKG is ordered today   SR 81 bpm    Lipid Panel    Component Value Date/Time   CHOL 165 12/07/2020 1108   CHOL 166 02/16/2018 0801   TRIG 76.0 12/07/2020 1108   HDL 82.20 12/07/2020 1108   HDL 97 02/16/2018 0801   CHOLHDL 2 12/07/2020 1108   VLDL 15.2 12/07/2020 1108   LDLCALC 68 12/07/2020 1108   LDLCALC 56 02/16/2018 0801   LDLDIRECT 124.7 07/20/2012 0958      Wt Readings from Last 3 Encounters:  03/12/22 97 lb 6.4 oz (44.2 kg)  02/18/22 94 lb (42.6 kg)  12/04/21 90 lb (40.8 kg)      ASSESSMENT AND PLAN:  1  CAD Pt with remote intervention   Myoview in 2021  Nromal   No symptoms to sugg ischyemia    2.  HTN  BP is fair     I recomm she follow at home more   Call if in 150s/    3  HL Check lipomed and A1C today   Increase activity       F/U in  clinic in AUg 2024  Signed, Dorris Carnes, MD  03/12/2022 11:49 AM    [address], [place],   [address] Phone: [phone]; Fax: [phone]

## 2022-03-12 ENCOUNTER — Ambulatory Visit: Payer: PPO | Attending: Internal Medicine | Admitting: Internal Medicine

## 2022-03-12 ENCOUNTER — Encounter: Payer: Self-pay | Admitting: Internal Medicine

## 2022-03-12 VITALS — BP 144/70 | HR 81 | Ht <= 58 in | Wt 97.4 lb

## 2022-03-12 DIAGNOSIS — E785 Hyperlipidemia, unspecified: Secondary | ICD-10-CM | POA: Diagnosis not present

## 2022-03-12 DIAGNOSIS — I2510 Atherosclerotic heart disease of native coronary artery without angina pectoris: Secondary | ICD-10-CM

## 2022-03-12 DIAGNOSIS — Z79899 Other long term (current) drug therapy: Secondary | ICD-10-CM | POA: Diagnosis not present

## 2022-03-12 NOTE — Patient Instructions (Addendum)
Medication Instructions:   *If you need a refill on your cardiac medications before your next appointment, please call your pharmacy*   Lab Work: NMR, LIPO A, [place]  If you have labs (blood work) drawn today and your tests are completely normal, you will receive your results only by: [place] (if you have MyChart) OR A paper copy in the mail If you have any lab test that is abnormal or we need to change your treatment, we will call you to review the results.   Testing/Procedures:    Follow-Up: At [facility], you and your health needs are our priority.  As part of our continuing mission to provide you with exceptional heart care, we have created designated Provider Care Teams.  These Care Teams include your primary Cardiologist (physician) and Advanced Practice Providers (APPs -  Physician Assistants and Nurse Practitioners) who all work together to provide you with the care you need, when you need it.  We recommend signing up for the patient portal called "MyChart".  Sign up information is provided on this After Visit Summary.  MyChart is used to connect with patients for Virtual Visits (Telemedicine).  Patients are able to view lab/test results, encounter notes, upcoming appointments, etc.  Non-urgent messages can be sent to your provider as well.   To learn more about what you can do with MyChart, go to [email or web address].    Your next appointment:   9 month(s)  The format for your next appointment:   In Person  Provider:   Dorris Carnes, MD     Other Instructions:  CALL IF YOUR BP IS CONSISTENTLY STAYING ABOVE 150 ON THE TOP    Important Information About Sugar

## 2022-03-13 LAB — NMR, LIPOPROFILE
Cholesterol, Total: 174 mg/dL (ref 100–199)
HDL Particle Number: 36.3 umol/L (ref >=30.5)
HDL-C: 94 mg/dL (ref >39)
LDL Particle Number: 456 nmol/L (ref <1000)
LDL Size: 21.7 nm (ref >20.5)
LDL-C (NIH Calc): 65 mg/dL (ref 0–99)
LP-IR Score: <25 (ref <=45)
Small LDL Particle Number: <90 nmol/L (ref <=527)
Triglycerides: 82 mg/dL (ref 0–149)

## 2022-03-13 LAB — APOLIPOPROTEIN B: Apolipoprotein B: 65 mg/dL (ref <90)

## 2022-03-13 LAB — HEMOGLOBIN A1C
Est. average glucose Bld gHb Est-mCnc: 128 mg/dL
Hgb A1c MFr Bld: 6.1 % — ABNORMAL HIGH (ref 4.8–5.6)

## 2022-03-13 LAB — LIPOPROTEIN A (LPA): Lipoprotein (a): 248.4 nmol/L — ABNORMAL HIGH (ref <75.0)

## 2022-04-15 ENCOUNTER — Ambulatory Visit (INDEPENDENT_AMBULATORY_CARE_PROVIDER_SITE_OTHER): Payer: PPO | Admitting: Adult Health

## 2022-04-15 VITALS — BP 150/70 | HR 80 | Temp 97.1°F | Ht <= 58 in | Wt 98.6 lb

## 2022-04-15 DIAGNOSIS — R4781 Slurred speech: Secondary | ICD-10-CM

## 2022-04-15 DIAGNOSIS — I10 Essential (primary) hypertension: Secondary | ICD-10-CM | POA: Diagnosis not present

## 2022-04-15 DIAGNOSIS — R2681 Unsteadiness on feet: Secondary | ICD-10-CM

## 2022-04-15 MED ORDER — AMLODIPINE BESYLATE 5 MG PO TABS: 5.0000 mg | ORAL_TABLET | Freq: Every day | ORAL | 0 refills | Status: DC

## 2022-04-15 NOTE — Patient Instructions (Signed)
I am going to refer you to physical therapy   I am also going to refer you for an MRI   I have increased your Norvasc from 2.5 to 5 mg to help bring down your blood pressure  Please follow up in one month

## 2022-04-15 NOTE — Progress Notes (Signed)
Subjective:    Patient ID: Jennifer Donaldson, female    DOB: 1932-05-26, 89 y.o.   MRN: [id number]  HPI 89 year old female who  has a past medical history of C. difficile colitis, CAD (coronary artery disease), Dyslipidemia (12/24/2006), Hyperlipidemia, Mitral regurgitation, MITRAL REGURGITATION (08/12/2008), Osteopenia, Osteoporosis (03/01/2014), Raynaud's disease, RAYNAUD'S DISEASE (12/24/2006), and Special screening for malignant neoplasm of colon.  She presents to the office today with her daughter.   She has had frequent falls, last being a few weeks ago. She reports that she shut out her lights and when she was walking she grabbed on a chair and the chair tipped over, she fell hitting the back of her head. She did not have any LOC. No longer has pain at site of impact.   She reports having issues with gait for quite some time, especially apparent when she turns of the lights. " I always feel like I am leaning to the right and have to hold onto the wall or furniture when I walk  On Christmas Eve she was picked up by her son to go to a Christmas program, while in the car she had a period slurred speech that lasted about 3 minutes " it was like my tongue was tied".. She did not have any facial droop or extremity weakness. She has not had any symptoms since this incident.  Review of Systems See HPI   Past Medical History:  Diagnosis Date   C. difficile colitis    CAD (coronary artery disease)    Dyslipidemia 12/24/2006   Qualifier: Diagnosis of  By: Sherren Mocha RN, Ellen     Hyperlipidemia    Mitral regurgitation    MITRAL REGURGITATION 08/12/2008   Qualifier: Diagnosis of  By: Burnett Kanaris     Osteopenia    Osteoporosis 03/01/2014   Raynaud's disease    RAYNAUD'S DISEASE 12/24/2006   Qualifier: Diagnosis of  By: Sherren Mocha RN, Dorian Pod     Special screening for malignant neoplasm of colon     Social History   Socioeconomic History   Marital status: Married    Spouse name: Not on file    Number of children: Not on file   Years of education: Not on file   Highest education level: Not on file  Occupational History   Not on file  Tobacco Use   Smoking status: Former    Packs/day: 0.50    Years: 20.00    Total pack years: 10.00    Types: Cigarettes    Quit date: 09/01/1983    Years since quitting: 38.6   Smokeless tobacco: Never  Vaping Use   Vaping Use: Never used  Substance and Sexual Activity   Alcohol use: No    Alcohol/week: 0.0 standard drinks of alcohol   Drug use: No   Sexual activity: Yes    Birth control/protection: None    Comment: Married  Other Topics Concern   Not on file  Social History Narrative   ** Merged History Encounter **       Retired Engineer, structural Married for 69 years - husband has multiple medical issues and is disabled 4 children Grew up in [place] Strain: [facility]  (02/18/2022)   Overall Financial Resource Strain ([place])    Difficulty of Paying Living Expenses: Not hard at all  Food Insecurity: No [place] (02/18/2022)   Hunger Vital Sign    Worried About Running Out  of Food in the Last Year: Never true    [place] in the Last Year: Never true  Transportation Needs: No Transportation Needs (02/18/2022)   PRAPARE - Hydrologist (Medical): No    Lack of Transportation (Non-Medical): No  Physical Activity: Insufficiently Active (02/18/2022)   Exercise Vital Sign    Days of Exercise per Week: 1 day    Minutes of Exercise per Session: 30 min  Stress: No Stress Concern Present (02/18/2022)   [place]    Feeling of Stress : Not at all  Social Connections: Unknown (02/18/2022)   Social Connection and Isolation Panel [NHANES]    Frequency of Communication with Friends and Family: More than three times a week    Frequency of Social Gatherings with Friends and  Family: More than three times a week    Attends Religious Services: More than 4 times per year    Active Member of [employer] or Organizations: Yes    Attends Archivist Meetings: More than 4 times per year    Marital Status: Not on file  Intimate Partner Violence: Not At Risk (02/18/2022)   Humiliation, Afraid, Rape, and Kick questionnaire    Fear of Current or Ex-Partner: No    Emotionally Abused: No    Physically Abused: No    Sexually Abused: No    Past Surgical History:  Procedure Laterality Date   BREAST EXCISIONAL BIOPSY Left    CORONARY STENT PLACEMENT     2 prior drug eluting stent placed in the LAD and right coronary artery     Family History  Problem Relation Age of Onset   CAD Brother    Stroke Father    CAD Mother    Breast cancer Neg Hx     Allergies  Allergen Reactions   Celexa [Citalopram]     Confusion     Codeine Sulfate     REACTION: sensitivity to    Current Outpatient Medications on File Prior to Visit  Medication Sig Dispense Refill   alendronate (FOSAMAX) 70 MG tablet Take 70 mg by mouth once a week.  11   amLODipine (NORVASC) 2.5 MG tablet TAKE 1 TABLET BY MOUTH EVERY DAY 90 tablet 1   aspirin 81 MG tablet Take 81 mg by mouth daily.     atorvastatin (LIPITOR) 20 MG tablet TAKE 1 TABLET BY MOUTH DAILY AT 6 PM. 90 tablet 2   calcium citrate-vitamin D (CITRACAL+D) 315-200 MG-UNIT per tablet Take 2 tablets by mouth daily.      Cholecalciferol (VITAMIN D3) 1000 UNITS CAPS Take 1 tablet by mouth daily.       cyclobenzaprine (FLEXERIL) 5 MG tablet TAKE 1 TABLET BY MOUTH EVERYDAY AT BEDTIME 15 tablet 0   ezetimibe (ZETIA) 10 MG tablet TAKE 1 TABLET BY MOUTH EVERY DAY 90 tablet 0   meloxicam (MOBIC) 15 MG tablet TAKE 1 (ONE) TABLET DAILY AS NEEDED FOR PAIN 90 tablet 0   metroNIDAZOLE (METROCREAM) 0.75 % cream Apply topically 2 (two) times daily.     omeprazole (PRILOSEC) 20 MG capsule Take 1 capsule (20 mg total) by mouth daily. 90 capsule 3   No  current facility-administered medications on file prior to visit.    BP (!) 150/70   Pulse 80   Temp (!) 97.1 F (36.2 C) (Oral)   Ht '4\' 10"'$  (1.473 m)   Wt 98 lb 9.6 oz (44.7 kg)  SpO2 98%   BMI 20.61 kg/m       Objective:   Physical Exam Vitals and nursing note reviewed.  Constitutional:      Appearance: Normal appearance.  Eyes:     Extraocular Movements: Extraocular movements intact.     Right eye: Normal extraocular motion and no nystagmus.     Left eye: Normal extraocular motion and no nystagmus.     Conjunctiva/sclera: Conjunctivae normal.     Pupils: Pupils are equal, round, and reactive to light.  Neck:     Vascular: Normal carotid pulses. No carotid bruit.  Cardiovascular:     Rate and Rhythm: Normal rate and regular rhythm.     Pulses: Normal pulses.     Heart sounds: Normal heart sounds.  Pulmonary:     Effort: Pulmonary effort is normal.     Breath sounds: Normal breath sounds.  Musculoskeletal:        General: Normal range of motion.  Skin:    General: Skin is warm and dry.     Capillary Refill: Capillary refill takes less than 2 seconds.  Neurological:     General: No focal deficit present.     Mental Status: She is alert and oriented to person, place, and time. Mental status is at baseline.     Cranial Nerves: Cranial nerves 2-12 are intact.     Sensory: Sensation is intact.     Motor: Motor function is intact. No weakness, tremor, atrophy or pronator drift.     Coordination: Coordination is intact. Coordination normal. Finger-Nose-Finger Test normal.     Gait: Gait abnormal (unsteady getting onto exam table.).  Psychiatric:        Mood and Affect: Mood normal.        Behavior: Behavior normal.        Thought Content: Thought content normal.        Judgment: Judgment normal.           Assessment & Plan:  1. Slurred speech - Neuro exam normal today  - Concern for TIA.  - Will order MRA head - MR Angiogram Head Wo Contrast; Future  2.  Gait instability  - Ambulatory referral to Physical Therapy  3. Primary hypertension - elevated in the office. Will increase norvasc from 2.5 mg to 5 mg  - amLODipine (NORVASC) 5 MG tablet; Take 1 tablet (5 mg total) by mouth daily.  Dispense: 90 tablet; Refill: 0  Dorothyann Peng, NP  Time spent with patient today was 33 minutes which consisted of chart review, discussing HTN, gait instability, falls precautions TIA's,  work up, treatment answering questions and documentation.

## 2022-04-21 ENCOUNTER — Ambulatory Visit: Payer: PPO | Attending: Adult Health | Admitting: Physical Therapy

## 2022-04-21 ENCOUNTER — Encounter: Payer: Self-pay | Admitting: Physical Therapy

## 2022-04-21 DIAGNOSIS — M5459 Other low back pain: Secondary | ICD-10-CM | POA: Insufficient documentation

## 2022-04-21 DIAGNOSIS — M6281 Muscle weakness (generalized): Secondary | ICD-10-CM | POA: Diagnosis not present

## 2022-04-21 DIAGNOSIS — R2681 Unsteadiness on feet: Secondary | ICD-10-CM | POA: Diagnosis not present

## 2022-04-21 NOTE — Therapy (Signed)
OUTPATIENT PHYSICAL THERAPY THORACOLUMBAR EVALUATION   Patient Name: Jennifer Donaldson MRN: [id number] DOB:1933-02-27, 89 y.o., female Today's Date: 04/21/2022  END OF SESSION:  PT End of Session - 04/21/22 1325     Visit Number 1    Number of Visits 12    Date for PT Re-Evaluation 06/02/22    Authorization Type [employer]    PT Start Time 1330    PT Stop Time 1419    PT Time Calculation (min) 49 min    Activity Tolerance Patient tolerated treatment well    Behavior During Therapy [facility] for tasks assessed/performed             Past Medical History:  Diagnosis Date   C. difficile colitis    CAD (coronary artery disease)    Dyslipidemia 12/24/2006   Qualifier: Diagnosis of  By: Sherren Mocha RN, Dorian Pod     Hyperlipidemia    Mitral regurgitation    MITRAL REGURGITATION 08/12/2008   Qualifier: Diagnosis of  By: Burnett Kanaris     Osteopenia    Osteoporosis 03/01/2014   Raynaud's disease    RAYNAUD'S DISEASE 12/24/2006   Qualifier: Diagnosis of  By: Sherren Mocha, RN, Dorian Pod     Special screening for malignant neoplasm of colon    Past Surgical History:  Procedure Laterality Date   BREAST EXCISIONAL BIOPSY Left    CORONARY STENT PLACEMENT     2 prior drug eluting stent placed in the LAD and right coronary artery    Patient Active Problem List   Diagnosis Date Noted   Trigger finger, right ring finger 11/24/2017   Follow up 08/30/2014   Osteoporosis 03/01/2014   MITRAL REGURGITATION 08/12/2008   CAD (coronary artery disease) 08/12/2008   Dyslipidemia 12/24/2006   RAYNAUD'S DISEASE 12/24/2006    PCP: Dorothyann Peng NP  REFERRING PROVIDER: Dorothyann Peng NP  REFERRING DIAG: R26.81 (ICD-10-CM) - Gait instability  Rationale for Evaluation and Treatment: Rehabilitation  THERAPY DIAG:  Gait instability  Muscle weakness (generalized)  Other low back pain  ONSET DATE: 6 mos ago  SUBJECTIVE:                                                                                                                                                                                            SUBJECTIVE STATEMENT: Pt reports over overall decline of physical activity over the past 4 to 5 years.  For years she was the primary caregiver for her husband who passed in June 2023.  She has been doing a lot of heavy lifting and physical work related to this task.  She has had 3 falls recently and  increased back pain.  Around Christmas as she went to church and in the car her family asked her question and when she answered she slurred her speech.  The doctor thinks she may have had a TIA.  She does also report memory loss.  Her family namely her daughter takes her to appointments and does shopping for her .  she is begin worked up for the neuro issues.  She falls because of "not paying attention", she drifts to the Rt.  She has a cane but does not use.  She denies dizziness or lightheadedness.  She reports general weakness, a now-sedentary lifestyle.    PERTINENT HISTORY:   Osteoporosis, Raynaud's feet and hands , poor grip  PAIN:  Are you having pain? Yes: NPRS scale: 2/10 Pain location: Rt low back  Pain description: sore, tender Aggravating factors: AM, wake up, used to increase with activity, standing  Relieving factors: sitting down , rest   Pre-medicated today.    PRECAUTIONS: Fall and Other: Neuro sx  WEIGHT BEARING RESTRICTIONS: No  FALLS:  Has patient fallen in last 6 months? Yes. Number of falls 3  LIVING ENVIRONMENT: Lives with: lives alone Lives in: House/apartment Stairs: No Has following equipment at home: Nonehas cane and husbands equipment  OCCUPATION: Retired, was a Surveyor, quantity.  Of [employer]   PLOF: Independent recently her husband passed  PATIENT GOALS: I just want to feel better and get around better without hurting   NEXT MD VISIT:   OBJECTIVE:   DIAGNOSTIC FINDINGS:  Brain angio uncoming   PATIENT SURVEYS:  FOTO 48%  SCREENING FOR RED  FLAGS: Bowel or bladder incontinence: No Spinal tumors: No Cauda equina syndrome: No Compression fracture: No Abdominal aneurysm: No  COGNITION: Overall cognitive status: History of cognitive impairments - at baseline     SENSATION: Hands and feet numb at times, due to Raynauds   MUSCLE LENGTH: Hamstrings: NT Thomas test: NT  POSTURE: rounded shoulders, forward head, increased thoracic kyphosis, and posterior pelvic tilt  PALPATION: nT   LUMBAR ROM:   AROM eval  Flexion NT osteo- porosis  Extension To neutral   Right lateral flexion WFL   Left lateral flexion Pain on L   Right rotation 50% limited   Left rotation 50% limited    (Blank rows = not tested)  LOWER EXTREMITY ROM:      NT on eval Active  Right eval Left eval  Hip flexion    Hip extension    Hip abduction    Hip adduction    Hip internal rotation    Hip external rotation    Knee flexion    Knee extension    Ankle dorsiflexion    Ankle plantarflexion    Ankle inversion    Ankle eversion     (Blank rows = not tested)  LOWER EXTREMITY MMT:    MMT Right eval Left eval  Hip flexion 4 4  Hip extension    Hip abduction    Hip adduction    Hip internal rotation    Hip external rotation    Knee flexion 4+ 4+  Knee extension 4+ 4+  Ankle dorsiflexion    Ankle plantarflexion    Ankle inversion    Ankle eversion     (Blank rows = not tested)  LUMBAR SPECIAL TESTS:  NT on eval   FUNCTIONAL TESTS:  04/21/22: [name] Balance score 45/56 TUG NT on eval  5 x STS = 19 sec  30 sec STS : 9.5  reps 2 min walk test 4/10 back pain , 376 feet    GAIT: Distance walked: 376 Assistive device utilized: None Level of assistance: Modified independence Comments: irregular patterning/sway, mostly due to need for directional cues   TODAY'S TREATMENT:                                                                                                                              DATE: 04/21/22   BERG  BALANCE  Sitting to Standing: Numbers; 0-4: 4  4. Stands without using hands and stabilize independently  3. Stands independently using hands  2. Stands using hands after multiple trials  1. Min A to stand  0. Mod-Max A to stand Standing unsupported: Numbers; 0-4: 4  4. Stands safely for 2 minutes  3. Stands 2 minutes with supervision  2. Stands 30 seconds unsupported  1. Needs several tries to stand unsupported for 30 seconds  0. Unable to stand unsupported for 30 seconds Sitting unsupported: Numbers; 0-4: 4  4. Sits for 2 minutes independently  3. Sits for 2 minutes with supervision  2. Able to sit 30 seconds  1. Able to sit 10 seconds  0. Unable to sit for 10 seconds Standing to Sitting: Numbers; 0-4: 4 4. Sits safely with minimal use of hands 3. Controls descent with hands 2. Uses back of legs against chair to control descent 1. Sits independently, but uncontrolled descent 0. Needs assistance Transfers: Numbers; 0-4: 4  4. Transfers safely with minor use of hands  3. Transfers safely definite use of hands  2. Transfers with verbal cueing/supervision  1. Needs 1 person assist  0. Needs 2 person assist  Standing with eyes closed: Numbers; 0-4: 4  4. Stands safely for 10 seconds  3. Stands 10 seconds with supervision   2. Able to stand for 3 seconds  1. Unable to keep eyes closed for 3 seconds, but is safe  0. Needs assist to keep from falling Standing with feet together: Numbers; 0-4: 4 4. Stands for 1 minute safely 3. Stands for 1 minute with supervision 2. Unable to hold for 30 seconds  1. Needs help to attain position but can hold for 15 seconds  0. Needs help to attain position and unable to hold for 15 seconds Reaching forward with outstretched arm:  4. Reaches forward 10 inches  3. Reaches forward 5 inches  2. Reaches forward 2 inches  1. Reaches forward with supervision  0. Loses balance/requires assistace Retrieving object from the floor: Numbers; 0-4:  4 4. Able to pick up easily and safely 3. Able to pick up with supervision 2. Unable to pick up, but reaches within 1-2 inches independently 1. Unable to pick up and needs supervision 0. Unable/needs assistance to keep from falling  Turning to look behind: Numbers; 0-4: 2  4. Looks behind from both sides and weight shifts well  3. Looks behind one side only, other side less weight shift  2. Turns sideways only, maintains balance  1. Needs supervision when turning  0. Needs assistance  Turning 360 degrees: Numbers; 0-4: 2  4. Able to turn in </=4 seconds  3. Able to turn on one side in </= 4 seconds   2. Able to turn slowly, but safely  1. Needs supervision or verbal cueing  0. Needs assistance Place alternate foot on stool: Numbers; 0-4: 3 4. Completes 8 steps in 20 seconds 3. Completes 8 steps in >20 seconds 2. 4 steps without assistance/supervision 1. Completes >2 steps with minimal assist 0. Unable, needs assist to keep from falling Standing with one foot in front: Numbers; 0-4: 1  4. Independent tandem for 30 seconds  3. Independent foot ahead for 30 seconds  2. Independent small step for 30 seconds  1. Needs help to step, but can hold for 15 seconds  0. Loses balance while standing/stepping Standing on one foot: Numbers; 0-4: 2 4. Holds >10 seconds 3. Holds 5-10 seconds 2. Holds >/=3 seconds  1. Holds <3 seconds 0. Unable   Total Score: 45/56  PATIENT EDUCATION:  Education details: PT/POC, balance, posture Person educated: Patient Education method: Customer service manager Education comprehension: verbalized understanding and needs further education  HOME EXERCISE PROGRAM: None on eval   ASSESSMENT:  CLINICAL IMPRESSION: Patient is a 89 y.o. female  who was seen today for physical therapy evaluation and treatment for gait instability.  She also complains of low back pain min to moderate mostly with standing activity and walking.  She is in the process of  being worked up for recent episode of slurred speech and a change in her gait stability.  I think she is emotionally and physically recovering from the loss of her husband and the role of primary care taking.  She does spend quite a lot of time sitting and just needs the right amount of gentle exercise for some general lower body and trunk strengthening.  We will incorporate education on bone density posture and body mechanics to help improve her quality of life.  OBJECTIVE IMPAIRMENTS: Abnormal gait, decreased activity tolerance, decreased balance, decreased cognition, decreased endurance, decreased knowledge of use of DME, decreased mobility, difficulty walking, decreased ROM, decreased strength, impaired sensation, improper body mechanics, postural dysfunction, and pain.   ACTIVITY LIMITATIONS: carrying, lifting, standing, stairs, transfers, and locomotion level  PARTICIPATION LIMITATIONS: cleaning, interpersonal relationship, shopping, and community activity  PERSONAL FACTORS: 1-2 comorbidities: osteoporosis, back pain  are also affecting patient's functional outcome.   REHAB POTENTIAL: Excellent  CLINICAL DECISION MAKING: Stable/uncomplicated  EVALUATION COMPLEXITY: Low   GOALS: Goals reviewed with patient? Yes  LONG TERM GOALS: Target date: 06/02/2022    Pt will be I with HEP for general strength, mobility, balance  Baseline: unknown Goal status: INITIAL  2.  Pt will be able to improve her Berg balance test by 6 points (51/56) to demo improved balance  Baseline: 45/56 Goal status: INITIAL  3.  Pt will be able to perform sit to stand in 13 sec or less  Baseline: 19 sec  Goal status: INITIAL  4.  Pt will be able to demonstrate improved ability to maintain upright spine with walking due to less back pain  Baseline:  Goal status: INITIAL  5.  Pt will be able to perform ADLs, light home tasks with no increase in back pain Baseline:  Goal status: INITIAL  PLAN:  PT  FREQUENCY: 2x/week  PT DURATION: 6 weeks  PLANNED INTERVENTIONS: Therapeutic exercises, Therapeutic activity, Neuromuscular  re-education, Balance training, Gait training, Patient/Family education, Self Care, Joint mobilization, Cryotherapy, Moist heat, Manual therapy, and Re-evaluation.  PLAN FOR NEXT SESSION: establish HEP , balance, TUG    Shalandra Leu, PT 04/21/2022, 3:20 PM   Raeford Razor, PT 04/21/22 4:48 PM Phone: [phone] Fax: [phone]

## 2022-04-23 ENCOUNTER — Other Ambulatory Visit: Payer: Self-pay | Admitting: Adult Health

## 2022-04-23 DIAGNOSIS — K2100 Gastro-esophageal reflux disease with esophagitis, without bleeding: Secondary | ICD-10-CM

## 2022-04-29 ENCOUNTER — Ambulatory Visit: Payer: PPO

## 2022-05-01 ENCOUNTER — Ambulatory Visit: Payer: PPO | Admitting: Physical Therapy

## 2022-05-03 ENCOUNTER — Other Ambulatory Visit: Payer: Self-pay | Admitting: Internal Medicine

## 2022-05-06 ENCOUNTER — Ambulatory Visit: Payer: PPO

## 2022-05-06 DIAGNOSIS — R2681 Unsteadiness on feet: Secondary | ICD-10-CM | POA: Diagnosis not present

## 2022-05-06 DIAGNOSIS — M6281 Muscle weakness (generalized): Secondary | ICD-10-CM

## 2022-05-06 DIAGNOSIS — M5459 Other low back pain: Secondary | ICD-10-CM

## 2022-05-06 NOTE — Therapy (Signed)
OUTPATIENT PHYSICAL THERAPY TREATMENT NOTE   Patient Name: Jennifer Donaldson MRN: [id number] DOB:[birth year], 89 y.o., female Today's Date: 05/06/2022  PCP: Dorothyann Peng NP   REFERRING PROVIDER: Dorothyann Peng NP   END OF SESSION:   PT End of Session - 05/06/22 1507     Visit Number 2    Number of Visits 12    Date for PT Re-Evaluation 06/02/22    Authorization Type [employer]    PT Start Time 1500    PT Stop Time 1540    PT Time Calculation (min) 40 min    Activity Tolerance Patient tolerated treatment well    Behavior During Therapy [facility] for tasks assessed/performed             Past Medical History:  Diagnosis Date   C. difficile colitis    CAD (coronary artery disease)    Dyslipidemia 12/24/2006   Qualifier: Diagnosis of  By: Sherren Mocha RN, Dorian Pod     Hyperlipidemia    Mitral regurgitation    MITRAL REGURGITATION 08/12/2008   Qualifier: Diagnosis of  By: Burnett Kanaris     Osteopenia    Osteoporosis 03/01/2014   Raynaud's disease    RAYNAUD'S DISEASE 12/24/2006   Qualifier: Diagnosis of  By: Sherren Mocha, RN, Dorian Pod     Special screening for malignant neoplasm of colon    Past Surgical History:  Procedure Laterality Date   BREAST EXCISIONAL BIOPSY Left    CORONARY STENT PLACEMENT     2 prior drug eluting stent placed in the LAD and right coronary artery    Patient Active Problem List   Diagnosis Date Noted   Trigger finger, right ring finger 11/24/2017   Follow up 08/30/2014   Osteoporosis 03/01/2014   MITRAL REGURGITATION 08/12/2008   CAD (coronary artery disease) 08/12/2008   Dyslipidemia 12/24/2006   RAYNAUD'S DISEASE 12/24/2006    REFERRING DIAG: R26.81 (ICD-10-CM) - Gait instability   THERAPY DIAG:  Gait instability  Muscle weakness (generalized)  Other low back pain  Rationale for Evaluation and Treatment Rehabilitation  SUBJECTIVE:                                                                                                                                                                                           SUBJECTIVE STATEMENT: I notice my balance is off when I turn off my hall light and walk down to my bedroom in the dark.   PAIN:  Are you having pain? Yes: NPRS scale: 3/10 Pain location: Rt low back  Pain description: sore, tender Aggravating factors: AM, wake up, used to increase with activity, standing  Relieving factors: sitting  down , rest   Pre-medicated today.   PERTINENT HISTORY:  Osteoporosis, Raynaud's feet and hands , poor grip    PRECAUTIONS: Fall and Other: Neuro sx   WEIGHT BEARING RESTRICTIONS: No   FALLS:  Has patient fallen in last 6 months? Yes. Number of falls 3   LIVING ENVIRONMENT: Lives with: lives alone Lives in: House/apartment Stairs: No Has following equipment at home: Nonehas cane and husbands equipment   OCCUPATION: Retired, was a Surveyor, quantity.  Of [employer]    PLOF: Independent recently her husband passed   PATIENT GOALS: I just want to feel better and get around better without hurting    NEXT MD VISIT:    OBJECTIVE: (objective measures completed at initial evaluation unless otherwise dated)    DIAGNOSTIC FINDINGS:  Brain angio uncoming    PATIENT SURVEYS:  FOTO 48%   SCREENING FOR RED FLAGS: Bowel or bladder incontinence: No Spinal tumors: No Cauda equina syndrome: No Compression fracture: No Abdominal aneurysm: No   COGNITION: Overall cognitive status: History of cognitive impairments - at baseline                               SENSATION: Hands and feet numb at times, due to Raynauds    MUSCLE LENGTH: Hamstrings: NT Thomas test: NT   POSTURE: rounded shoulders, forward head, increased thoracic kyphosis, and posterior pelvic tilt   PALPATION: nT    LUMBAR ROM:    AROM eval  Flexion NT osteo- porosis  Extension To neutral   Right lateral flexion WFL   Left lateral flexion Pain on L   Right rotation 50% limited   Left rotation 50% limited     (Blank rows = not tested)   LOWER EXTREMITY ROM:                          NT on eval Active  Right eval Left eval  Hip flexion      Hip extension      Hip abduction      Hip adduction      Hip internal rotation      Hip external rotation      Knee flexion      Knee extension      Ankle dorsiflexion      Ankle plantarflexion      Ankle inversion      Ankle eversion       (Blank rows = not tested)   LOWER EXTREMITY MMT:     MMT Right eval Left eval  Hip flexion 4 4  Hip extension      Hip abduction      Hip adduction      Hip internal rotation      Hip external rotation      Knee flexion 4+ 4+  Knee extension 4+ 4+  Ankle dorsiflexion      Ankle plantarflexion      Ankle inversion      Ankle eversion       (Blank rows = not tested)   LUMBAR SPECIAL TESTS:  NT on eval    FUNCTIONAL TESTS:  04/21/22: [name] Balance score 45/56 TUG NT on eval; 11, 9.6=10.3" 5 x STS = 19 sec  30 sec STS : 9.5 reps 2 min walk test 4/10 back pain , 376 feet SLS: L 3,5=4"; R8,6=7"        GAIT: Distance walked:  376 Assistive device utilized: None Level of assistance: Modified independence Comments: irregular patterning/sway, mostly due to need for directional cues    TODAY'S TREATMENT:   [facility] Adult PT Treatment:                                                DATE: 05/06/22 Therapeutic Exercise: Nustep 6 min UE/LE L4 Bridging x15 SL clams x15 RTB SL hip abd x15 RTB STS x10 SLS x5 each Updated HEP Self Care: Safety ed for use of proper lighting in home                                                                                                                             DATE: 04/21/22    BERG BALANCE   Sitting to Standing: Numbers; 0-4: 4            4. Stands without using hands and stabilize independently            3. Stands independently using hands            2. Stands using hands after multiple trials            1. Min A to stand            0. Mod-Max A to  stand Standing unsupported: Numbers; 0-4: 4            4. Stands safely for 2 minutes            3. Stands 2 minutes with supervision            2. Stands 30 seconds unsupported            1. Needs several tries to stand unsupported for 30 seconds            0. Unable to stand unsupported for 30 seconds Sitting unsupported: Numbers; 0-4: 4            4. Sits for 2 minutes independently            3. Sits for 2 minutes with supervision            2. Able to sit 30 seconds            1. Able to sit 10 seconds            0. Unable to sit for 10 seconds Standing to Sitting: Numbers; 0-4: 4 4. Sits safely with minimal use of hands 3. Controls descent with hands 2. Uses back of legs against chair to control descent 1. Sits independently, but uncontrolled descent 0. Needs assistance Transfers: Numbers; 0-4: 4            4. Transfers safely with minor use of hands  3. Transfers safely definite use of hands            2. Transfers with verbal cueing/supervision            1. Needs 1 person assist            0. Needs 2 person assist  Standing with eyes closed: Numbers; 0-4: 4            4. Stands safely for 10 seconds            3. Stands 10 seconds with supervision             2. Able to stand for 3 seconds            1. Unable to keep eyes closed for 3 seconds, but is safe            0. Needs assist to keep from falling Standing with feet together: Numbers; 0-4: 4 4. Stands for 1 minute safely 3. Stands for 1 minute with supervision 2. Unable to hold for 30 seconds            1. Needs help to attain position but can hold for 15 seconds            0. Needs help to attain position and unable to hold for 15 seconds Reaching forward with outstretched arm:            4. Reaches forward 10 inches            3. Reaches forward 5 inches            2. Reaches forward 2 inches            1. Reaches forward with supervision            0. Loses balance/requires assistace Retrieving  object from the floor: Numbers; 0-4: 4 4. Able to pick up easily and safely 3. Able to pick up with supervision 2. Unable to pick up, but reaches within 1-2 inches independently 1. Unable to pick up and needs supervision 0. Unable/needs assistance to keep from falling  Turning to look behind: Numbers; 0-4: 2            4. Looks behind from both sides and weight shifts well            3. Looks behind one side only, other side less weight shift            2. Turns sideways only, maintains balance            1. Needs supervision when turning            0. Needs assistance  Turning 360 degrees: Numbers; 0-4: 2            4. Able to turn in </=4 seconds            3. Able to turn on one side in </= 4 seconds             2. Able to turn slowly, but safely            1. Needs supervision or verbal cueing            0. Needs assistance Place alternate foot on stool: Numbers; 0-4: 3 4. Completes 8 steps in 20 seconds 3. Completes 8 steps in >20 seconds 2. 4 steps without assistance/supervision 1. Completes >2 steps with minimal assist 0. Unable, needs assist to keep  from falling Standing with one foot in front: Numbers; 0-4: 1            4. Independent tandem for 30 seconds            3. Independent foot ahead for 30 seconds            2. Independent small step for 30 seconds            1. Needs help to step, but can hold for 15 seconds            0. Loses balance while standing/stepping Standing on one foot: Numbers; 0-4: 2 4. Holds >10 seconds 3. Holds 5-10 seconds 2. Holds >/=3 seconds  1. Holds <3 seconds 0. Unable    Total Score: 45/56  PATIENT EDUCATION:  Education details: PT/POC, balance, posture Person educated: Patient Education method: Explanation and Demonstration Education comprehension: verbalized understanding and needs further education   HOME EXERCISE PROGRAM: Access Code: [email or web address] URL: [email or web address] Date: 05/06/2022 Prepared by: Gar Ponto  Exercises - Supine Bridge  - 1 x daily - 7 x weekly - 2 sets - 10 reps - 3 hold - Clamshell with Resistance  - 1 x daily - 7 x weekly - 2 sets - 10 reps - 3 hold - Sidelying Hip Abduction  - 1 x daily - 7 x weekly - 2 sets - 10 reps - 3 hold - Sit to Stand Without Arm Support  - 1 x daily - 7 x weekly - 1 sets - 10 reps - Standing Single Leg Stance with Counter Support  - 1 x daily - 7 x weekly - 1 sets - 5 reps - 20 hold   ASSESSMENT:   CLINICAL IMPRESSION: With pt having difficulty with balance walking in a dark hallway, appears pt has some degree of proprioception or vestibular hypofunction. Pt's TUG was WNLs for pt's age. Pt's SLS bilat is decreased. Pt participated in PT for LE strengthening and balance and was provided a HEP. Pt tolerated PT today without adverse effects. Pt will continue to benefit from skilled PT to address impairments for improved function.   OBJECTIVE IMPAIRMENTS: Abnormal gait, decreased activity tolerance, decreased balance, decreased cognition, decreased endurance, decreased knowledge of use of DME, decreased mobility, difficulty walking, decreased ROM, decreased strength, impaired sensation, improper body mechanics, postural dysfunction, and pain.    ACTIVITY LIMITATIONS: carrying, lifting, standing, stairs, transfers, and locomotion level   PARTICIPATION LIMITATIONS: cleaning, interpersonal relationship, shopping, and community activity   PERSONAL FACTORS: 1-2 comorbidities: osteoporosis, back pain  are also affecting patient's functional outcome.    REHAB POTENTIAL: Excellent   CLINICAL DECISION MAKING: Stable/uncomplicated   EVALUATION COMPLEXITY: Low     GOALS: Goals reviewed with patient? Yes   LONG TERM GOALS: Target date: 06/02/2022     Pt will be I with HEP for general strength, mobility, balance  Baseline: unknown Goal status: INITIAL   2.  Pt will be able to improve her Berg balance test by 6 points (51/56) to demo improved balance   Baseline: 45/56 Goal status: INITIAL   3.  Pt will be able to perform sit to stand in 13 sec or less  Baseline: 19 sec  Goal status: INITIAL   4.  Pt will be able to demonstrate improved ability to maintain upright spine with walking due to less back pain  Baseline:  Goal status: INITIAL   5.  Pt will be able to perform ADLs, light home tasks with no  increase in back pain Baseline:  Goal status: INITIAL   PLAN:   PT FREQUENCY: 2x/week   PT DURATION: 6 weeks   PLANNED INTERVENTIONS: Therapeutic exercises, Therapeutic activity, Neuromuscular re-education, Balance training, Gait training, Patient/Family education, Self Care, Joint mobilization, Cryotherapy, Moist heat, Manual therapy, and Re-evaluation.   PLAN FOR NEXT SESSION: establish HEP , balance, TUG, assess foot sensation   Esteban Kobashigawa MS, PT 05/06/22 6:06 PM

## 2022-05-07 ENCOUNTER — Telehealth: Payer: Self-pay

## 2022-05-07 NOTE — Telephone Encounter (Signed)
Patient called in to request an appointment to see the provider. Patient report she is having pain in the right breast, no lump, or  no color change. Last visit with the provider had being two years ago. I advise the patient she had not being seeing by the provider in the last 2 years. She will be a new patient. She have a appointment with her pcp on 05/12/21. I ask the patient to have the pcp to check her breast and I give her a call on 05/08/21 to discuss what the pcp said.

## 2022-05-08 ENCOUNTER — Ambulatory Visit: Payer: PPO

## 2022-05-08 DIAGNOSIS — M6281 Muscle weakness (generalized): Secondary | ICD-10-CM

## 2022-05-08 DIAGNOSIS — R2681 Unsteadiness on feet: Secondary | ICD-10-CM | POA: Diagnosis not present

## 2022-05-08 DIAGNOSIS — M5459 Other low back pain: Secondary | ICD-10-CM

## 2022-05-08 NOTE — Therapy (Signed)
OUTPATIENT PHYSICAL THERAPY TREATMENT NOTE   Patient Name: Jennifer Donaldson MRN: [id number] DOB:May 22, 1932, 89 y.o., female Today's Date: 05/08/2022  PCP: Dorothyann Peng NP   REFERRING PROVIDER: Dorothyann Peng NP   END OF SESSION:   PT End of Session - 05/08/22 1508     Visit Number 3    Number of Visits 12    Date for PT Re-Evaluation 06/02/22    Authorization Type [employer]    PT Start Time 1502    PT Stop Time 1544    PT Time Calculation (min) 42 min    Activity Tolerance Patient tolerated treatment well    Behavior During Therapy [facility] for tasks assessed/performed              Past Medical History:  Diagnosis Date   C. difficile colitis    CAD (coronary artery disease)    Dyslipidemia 12/24/2006   Qualifier: Diagnosis of  By: Sherren Mocha RN, Dorian Pod     Hyperlipidemia    Mitral regurgitation    MITRAL REGURGITATION 08/12/2008   Qualifier: Diagnosis of  By: Burnett Kanaris     Osteopenia    Osteoporosis 03/01/2014   Raynaud's disease    RAYNAUD'S DISEASE 12/24/2006   Qualifier: Diagnosis of  By: Sherren Mocha, RN, Dorian Pod     Special screening for malignant neoplasm of colon    Past Surgical History:  Procedure Laterality Date   BREAST EXCISIONAL BIOPSY Left    CORONARY STENT PLACEMENT     2 prior drug eluting stent placed in the LAD and right coronary artery    Patient Active Problem List   Diagnosis Date Noted   Trigger finger, right ring finger 11/24/2017   Follow up 08/30/2014   Osteoporosis 03/01/2014   MITRAL REGURGITATION 08/12/2008   CAD (coronary artery disease) 08/12/2008   Dyslipidemia 12/24/2006   RAYNAUD'S DISEASE 12/24/2006    REFERRING DIAG: R26.81 (ICD-10-CM) - Gait instability   THERAPY DIAG:  Gait instability  Muscle weakness (generalized)  Other low back pain  Rationale for Evaluation and Treatment Rehabilitation  SUBJECTIVE:                                                                                                                                                                                           SUBJECTIVE STATEMENT: I'm using lights in my home and my balance walking down the hall with lighting is better. Pt reports her toes don't bend well.   PAIN:  Are you having pain? Yes: NPRS scale: 3/10 Pain location: Rt low back  Pain description: sore, tender Aggravating factors: AM, wake up, used to increase with activity, standing  Relieving factors: sitting down , rest   Pre-medicated today.   PERTINENT HISTORY:  Osteoporosis, Raynaud's feet and hands , poor grip    PRECAUTIONS: Fall and Other: Neuro sx   WEIGHT BEARING RESTRICTIONS: No   FALLS:  Has patient fallen in last 6 months? Yes. Number of falls 3   LIVING ENVIRONMENT: Lives with: lives alone Lives in: House/apartment Stairs: No Has following equipment at home: Nonehas cane and husbands equipment   OCCUPATION: Retired, was a Surveyor, quantity.  Of [employer]    PLOF: Independent recently her husband passed   PATIENT GOALS: I just want to feel better and get around better without hurting    NEXT MD VISIT:    OBJECTIVE: (objective measures completed at initial evaluation unless otherwise dated)    DIAGNOSTIC FINDINGS:  Brain angio uncoming    PATIENT SURVEYS:  FOTO 48%   SCREENING FOR RED FLAGS: Bowel or bladder incontinence: No Spinal tumors: No Cauda equina syndrome: No Compression fracture: No Abdominal aneurysm: No   COGNITION: Overall cognitive status: History of cognitive impairments - at baseline                               SENSATION: Hands and feet numb at times, due to Raynauds    MUSCLE LENGTH: Hamstrings: NT Thomas test: NT   POSTURE: rounded shoulders, forward head, increased thoracic kyphosis, and posterior pelvic tilt   PALPATION: nT    LUMBAR ROM:    AROM eval  Flexion NT osteo- porosis  Extension To neutral   Right lateral flexion WFL   Left lateral flexion Pain on L   Right rotation 50% limited    Left rotation 50% limited    (Blank rows = not tested)   LOWER EXTREMITY ROM:                          NT on eval Active  Right eval Left eval  Hip flexion      Hip extension      Hip abduction      Hip adduction      Hip internal rotation      Hip external rotation      Knee flexion      Knee extension      Ankle dorsiflexion      Ankle plantarflexion      Ankle inversion      Ankle eversion       (Blank rows = not tested)   LOWER EXTREMITY MMT:     MMT Right eval Left eval  Hip flexion 4 4  Hip extension      Hip abduction      Hip adduction      Hip internal rotation      Hip external rotation      Knee flexion 4+ 4+  Knee extension 4+ 4+  Ankle dorsiflexion      Ankle plantarflexion      Ankle inversion      Ankle eversion       (Blank rows = not tested)   LUMBAR SPECIAL TESTS:  NT on eval    FUNCTIONAL TESTS:  04/21/22: [name] Balance score 45/56 TUG NT on eval; 11, 9.6=10.3" 5 x STS = 19 sec  30 sec STS : 9.5 reps 2 min walk test 4/10 back pain , 376 feet SLS: L 3,5=4"; R 8,6=7"  GAIT: Distance walked: 376 Assistive device utilized: None Level of assistance: Modified independence Comments: irregular patterning/sway, mostly due to need for directional cues    TODAY'S TREATMENT:   [facility] Adult PT Treatment:                                                DATE: 05/08/23 Therapeutic Exercise: Seated towel scrunches bilat STS x10 SL hip clams  2x15 SL hip abd 2x15 Therapeutic Activity: SLS's at FM Narrow stance on airex at [facility] Marching on airex at [facility] SBA for safety Tandem stance on the airex at [facility] SBA for safety  [facility] Adult PT Treatment:                                                DATE: 05/06/22 Therapeutic Exercise: Nustep 6 min UE/LE L4 Bridging x15 SL clams x15 RTB SL hip abd x15 RTB STS x10 SLS x5 each Updated HEP Self Care: Safety ed for use of proper lighting in home                                                                                                                              DATE: 04/21/22    BERG BALANCE   Sitting to Standing: Numbers; 0-4: 4            4. Stands without using hands and stabilize independently            3. Stands independently using hands            2. Stands using hands after multiple trials            1. Min A to stand            0. Mod-Max A to stand Standing unsupported: Numbers; 0-4: 4            4. Stands safely for 2 minutes            3. Stands 2 minutes with supervision            2. Stands 30 seconds unsupported            1. Needs several tries to stand unsupported for 30 seconds            0. Unable to stand unsupported for 30 seconds Sitting unsupported: Numbers; 0-4: 4            4. Sits for 2 minutes independently            3. Sits for 2 minutes with supervision            2. Able to sit 30 seconds  1. Able to sit 10 seconds            0. Unable to sit for 10 seconds Standing to Sitting: Numbers; 0-4: 4 4. Sits safely with minimal use of hands 3. Controls descent with hands 2. Uses back of legs against chair to control descent 1. Sits independently, but uncontrolled descent 0. Needs assistance Transfers: Numbers; 0-4: 4            4. Transfers safely with minor use of hands            3. Transfers safely definite use of hands            2. Transfers with verbal cueing/supervision            1. Needs 1 person assist            0. Needs 2 person assist  Standing with eyes closed: Numbers; 0-4: 4            4. Stands safely for 10 seconds            3. Stands 10 seconds with supervision             2. Able to stand for 3 seconds            1. Unable to keep eyes closed for 3 seconds, but is safe            0. Needs assist to keep from falling Standing with feet together: Numbers; 0-4: 4 4. Stands for 1 minute safely 3. Stands for 1 minute with supervision 2. Unable to hold for 30 seconds            1. Needs help to attain position but can  hold for 15 seconds            0. Needs help to attain position and unable to hold for 15 seconds Reaching forward with outstretched arm:            4. Reaches forward 10 inches            3. Reaches forward 5 inches            2. Reaches forward 2 inches            1. Reaches forward with supervision            0. Loses balance/requires assistace Retrieving object from the floor: Numbers; 0-4: 4 4. Able to pick up easily and safely 3. Able to pick up with supervision 2. Unable to pick up, but reaches within 1-2 inches independently 1. Unable to pick up and needs supervision 0. Unable/needs assistance to keep from falling  Turning to look behind: Numbers; 0-4: 2            4. Looks behind from both sides and weight shifts well            3. Looks behind one side only, other side less weight shift            2. Turns sideways only, maintains balance            1. Needs supervision when turning            0. Needs assistance  Turning 360 degrees: Numbers; 0-4: 2            4. Able to turn in </=4 seconds            3. Able to turn on  one side in </= 4 seconds             2. Able to turn slowly, but safely            1. Needs supervision or verbal cueing            0. Needs assistance Place alternate foot on stool: Numbers; 0-4: 3 4. Completes 8 steps in 20 seconds 3. Completes 8 steps in >20 seconds 2. 4 steps without assistance/supervision 1. Completes >2 steps with minimal assist 0. Unable, needs assist to keep from falling Standing with one foot in front: Numbers; 0-4: 1            4. Independent tandem for 30 seconds            3. Independent foot ahead for 30 seconds            2. Independent small step for 30 seconds            1. Needs help to step, but can hold for 15 seconds            0. Loses balance while standing/stepping Standing on one foot: Numbers; 0-4: 2 4. Holds >10 seconds 3. Holds 5-10 seconds 2. Holds >/=3 seconds  1. Holds <3 seconds 0. Unable     Total Score: 45/56  PATIENT EDUCATION:  Education details: PT/POC, balance, posture Person educated: Patient Education method: Explanation and Demonstration Education comprehension: verbalized understanding and needs further education   HOME EXERCISE PROGRAM: Access Code: [email or web address] URL: [email or web address] Date: 05/08/2022 Prepared by: Gar Ponto  Exercises - Supine Bridge  - 1 x daily - 7 x weekly - 2 sets - 10 reps - 3 hold - Clamshell with Resistance  - 1 x daily - 7 x weekly - 2 sets - 10 reps - 3 hold - Sidelying Hip Abduction  - 1 x daily - 7 x weekly - 2 sets - 10 reps - 3 hold - Sit to Stand Without Arm Support  - 1 x daily - 7 x weekly - 1 sets - 10 reps - Standing Single Leg Stance with Counter Support  - 1 x daily - 7 x weekly - 1 sets - 5 reps - 20 hold - Seated Toe Towel Scrunches  - 1 x daily - 7 x weekly - 2 sets - 15 reps   ASSESSMENT:   CLINICAL IMPRESSION: Assessed light touch for dorsum of both feet and it was slightly diminished bilat. Pt maintained proper balance c feet together and eyes closed. PT was provided for balance and LE/trunk strengthening. Pt reports using lights in her home so as to not walk down a dark hallway or into a dark room. So good carry over from the last session's pt ed. Pt tolerated PT today without adverse effects. Pt will continue to benefit from skilled PT to address impairments for improved function.    OBJECTIVE IMPAIRMENTS: Abnormal gait, decreased activity tolerance, decreased balance, decreased cognition, decreased endurance, decreased knowledge of use of DME, decreased mobility, difficulty walking, decreased ROM, decreased strength, impaired sensation, improper body mechanics, postural dysfunction, and pain.    ACTIVITY LIMITATIONS: carrying, lifting, standing, stairs, transfers, and locomotion level   PARTICIPATION LIMITATIONS: cleaning, interpersonal relationship, shopping, and community activity   PERSONAL  FACTORS: 1-2 comorbidities: osteoporosis, back pain  are also affecting patient's functional outcome.    REHAB POTENTIAL: Excellent   CLINICAL DECISION MAKING: Stable/uncomplicated   EVALUATION COMPLEXITY: Low     GOALS:  Goals reviewed with patient? Yes   LONG TERM GOALS: Target date: 06/02/2022     Pt will be I with HEP for general strength, mobility, balance  Baseline: unknown Goal status: INITIAL   2.  Pt will be able to improve her Berg balance test by 6 points (51/56) to demo improved balance  Baseline: 45/56 Goal status: INITIAL   3.  Pt will be able to perform sit to stand in 13 sec or less  Baseline: 19 sec  Goal status: INITIAL   4.  Pt will be able to demonstrate improved ability to maintain upright spine with walking due to less back pain  Baseline:  Goal status: INITIAL   5.  Pt will be able to perform ADLs, light home tasks with no increase in back pain Baseline:  Goal status: INITIAL   PLAN:   PT FREQUENCY: 2x/week   PT DURATION: 6 weeks   PLANNED INTERVENTIONS: Therapeutic exercises, Therapeutic activity, Neuromuscular re-education, Balance training, Gait training, Patient/Family education, Self Care, Joint mobilization, Cryotherapy, Moist heat, Manual therapy, and Re-evaluation.   PLAN FOR NEXT SESSION: establish HEP , balance, TUG, assess foot sensation   Thao Bauza MS, PT 05/08/22 6:26 PM

## 2022-05-10 ENCOUNTER — Ambulatory Visit
Admission: RE | Admit: 2022-05-10 | Discharge: 2022-05-10 | Disposition: A | Discharge status: Home / Self Care | Payer: PPO | Source: Ambulatory Visit | Attending: Adult Health | Admitting: Adult Health

## 2022-05-10 DIAGNOSIS — R4781 Slurred speech: Secondary | ICD-10-CM

## 2022-05-10 DIAGNOSIS — S0990XA Unspecified injury of head, initial encounter: Secondary | ICD-10-CM | POA: Diagnosis not present

## 2022-05-13 ENCOUNTER — Ambulatory Visit: Payer: PPO

## 2022-05-13 DIAGNOSIS — M6281 Muscle weakness (generalized): Secondary | ICD-10-CM

## 2022-05-13 DIAGNOSIS — R2681 Unsteadiness on feet: Secondary | ICD-10-CM

## 2022-05-13 DIAGNOSIS — M5459 Other low back pain: Secondary | ICD-10-CM

## 2022-05-13 NOTE — Therapy (Signed)
OUTPATIENT PHYSICAL THERAPY TREATMENT NOTE   Patient Name: Jennifer Donaldson MRN: [id number] DOB:1932-04-17, 89 y.o., female Today's Date: 05/13/2022  PCP: Dorothyann Peng NP   REFERRING PROVIDER: Dorothyann Peng NP   END OF SESSION:   PT End of Session - 05/13/22 1509     Visit Number 4    Number of Visits 12    Date for PT Re-Evaluation 06/02/22    Authorization Type [employer]    PT Start Time 1501    PT Stop Time 3329    PT Time Calculation (min) 42 min    Activity Tolerance Patient tolerated treatment well    Behavior During Therapy [facility] for tasks assessed/performed              Past Medical History:  Diagnosis Date   C. difficile colitis    CAD (coronary artery disease)    Dyslipidemia 12/24/2006   Qualifier: Diagnosis of  By: Sherren Mocha RN, Dorian Pod     Hyperlipidemia    Mitral regurgitation    MITRAL REGURGITATION 08/12/2008   Qualifier: Diagnosis of  By: Burnett Kanaris     Osteopenia    Osteoporosis 03/01/2014   Raynaud's disease    RAYNAUD'S DISEASE 12/24/2006   Qualifier: Diagnosis of  By: Sherren Mocha, RN, Dorian Pod     Special screening for malignant neoplasm of colon    Past Surgical History:  Procedure Laterality Date   BREAST EXCISIONAL BIOPSY Left    CORONARY STENT PLACEMENT     2 prior drug eluting stent placed in the LAD and right coronary artery    Patient Active Problem List   Diagnosis Date Noted   Trigger finger, right ring finger 11/24/2017   Follow up 08/30/2014   Osteoporosis 03/01/2014   MITRAL REGURGITATION 08/12/2008   CAD (coronary artery disease) 08/12/2008   Dyslipidemia 12/24/2006   RAYNAUD'S DISEASE 12/24/2006    REFERRING DIAG: R26.81 (ICD-10-CM) - Gait instability   THERAPY DIAG:  Gait instability  Muscle weakness (generalized)  Other low back pain  Rationale for Evaluation and Treatment Rehabilitation  SUBJECTIVE:                                                                                                                                                                                           SUBJECTIVE STATEMENT: Pt reports she is feeling more steady and she is using lighting in her home to avoid walking in the dark. Pt notes she has been having more issues with swelling in her feet and that completing exsin SL bothers her low back   PAIN:  Are you having pain? Yes: NPRS scale: 3/10 Pain location: Rt  low back  Pain description: sore, tender Aggravating factors: AM, wake up, used to increase with activity, standing  Relieving factors: sitting down , rest   Pre-medicated today.   PERTINENT HISTORY:  Osteoporosis, Raynaud's feet and hands , poor grip    PRECAUTIONS: Fall and Other: Neuro sx   WEIGHT BEARING RESTRICTIONS: No   FALLS:  Has patient fallen in last 6 months? Yes. Number of falls 3   LIVING ENVIRONMENT: Lives with: lives alone Lives in: House/apartment Stairs: No Has following equipment at home: Nonehas cane and husbands equipment   OCCUPATION: Retired, was a Surveyor, quantity.  Of [employer]    PLOF: Independent recently her husband passed   PATIENT GOALS: I just want to feel better and get around better without hurting    NEXT MD VISIT:    OBJECTIVE: (objective measures completed at initial evaluation unless otherwise dated)    DIAGNOSTIC FINDINGS:  Brain angio uncoming    PATIENT SURVEYS:  FOTO 48%   SCREENING FOR RED FLAGS: Bowel or bladder incontinence: No Spinal tumors: No Cauda equina syndrome: No Compression fracture: No Abdominal aneurysm: No   COGNITION: Overall cognitive status: History of cognitive impairments - at baseline                               SENSATION: Hands and feet numb at times, due to Raynauds    MUSCLE LENGTH: Hamstrings: NT Thomas test: NT   POSTURE: rounded shoulders, forward head, increased thoracic kyphosis, and posterior pelvic tilt   PALPATION: nT    LUMBAR ROM:    AROM eval  Flexion NT osteo- porosis  Extension To neutral    Right lateral flexion WFL   Left lateral flexion Pain on L   Right rotation 50% limited   Left rotation 50% limited    (Blank rows = not tested)   LOWER EXTREMITY ROM:                          NT on eval Active  Right eval Left eval  Hip flexion      Hip extension      Hip abduction      Hip adduction      Hip internal rotation      Hip external rotation      Knee flexion      Knee extension      Ankle dorsiflexion      Ankle plantarflexion      Ankle inversion      Ankle eversion       (Blank rows = not tested)   LOWER EXTREMITY MMT:     MMT Right eval Left eval  Hip flexion 4 4  Hip extension      Hip abduction      Hip adduction      Hip internal rotation      Hip external rotation      Knee flexion 4+ 4+  Knee extension 4+ 4+  Ankle dorsiflexion      Ankle plantarflexion      Ankle inversion      Ankle eversion       (Blank rows = not tested)   LUMBAR SPECIAL TESTS:  NT on eval    FUNCTIONAL TESTS:  04/21/22: [name] Balance score 45/56 TUG NT on eval; 11, 9.6=10.3" 5 x STS = 19 sec  30 sec STS : 9.5 reps 2 min  walk test 4/10 back pain , 376 feet SLS: L 3,5=4"; R 8,6=7"        GAIT: Distance walked: 376 Assistive device utilized: None Level of assistance: Modified independence Comments: irregular patterning/sway, mostly due to need for directional cues    TODAY'S TREATMENT:  [facility] Adult PT Treatment:                                                DATE: 05/13/22 Therapeutic Exercise: Nustep 6 min UE/LE L4 Seated towel scrunches bilat STS 2x10 HL hip clams 2x15 RTB Seated LAQ 2x10 Standing ankle DF/PF 2x15 Standing hip abd 2x15 3# Therapeutic Activity: SLS's at [facility] Marching on airex at [facility] SBA for safety Tandem stance on the airex at [facility] SBA for safety   [facility] Adult PT Treatment:                                                DATE: 05/08/23 Therapeutic Exercise: Seated towel scrunches bilat STS x10 SL hip clams  2x15 SL hip abd  2x15 Therapeutic Activity: SLS's at FM Narrow stance on airex at [facility] Marching on airex at [facility] SBA for safety Tandem stance on the airex at [facility] SBA for safety  [facility] Adult PT Treatment:                                                DATE: 05/06/22 Therapeutic Exercise: Nustep 6 min UE/LE L4 Bridging x15 SL clams x15 RTB SL hip abd x15 RTB STS x10 SLS x5 each Updated HEP Self Care: Safety ed for use of proper lighting in home                                                                                                                             DATE: 04/21/22    BERG BALANCE   Sitting to Standing: Numbers; 0-4: 4            4. Stands without using hands and stabilize independently            3. Stands independently using hands            2. Stands using hands after multiple trials            1. Min A to stand            0. Mod-Max A to stand Standing unsupported: Numbers; 0-4: 4            4. Stands safely for 2 minutes  3. Stands 2 minutes with supervision            2. Stands 30 seconds unsupported            1. Needs several tries to stand unsupported for 30 seconds            0. Unable to stand unsupported for 30 seconds Sitting unsupported: Numbers; 0-4: 4            4. Sits for 2 minutes independently            3. Sits for 2 minutes with supervision            2. Able to sit 30 seconds            1. Able to sit 10 seconds            0. Unable to sit for 10 seconds Standing to Sitting: Numbers; 0-4: 4 4. Sits safely with minimal use of hands 3. Controls descent with hands 2. Uses back of legs against chair to control descent 1. Sits independently, but uncontrolled descent 0. Needs assistance Transfers: Numbers; 0-4: 4            4. Transfers safely with minor use of hands            3. Transfers safely definite use of hands            2. Transfers with verbal cueing/supervision            1. Needs 1 person assist            0. Needs 2 person assist   Standing with eyes closed: Numbers; 0-4: 4            4. Stands safely for 10 seconds            3. Stands 10 seconds with supervision             2. Able to stand for 3 seconds            1. Unable to keep eyes closed for 3 seconds, but is safe            0. Needs assist to keep from falling Standing with feet together: Numbers; 0-4: 4 4. Stands for 1 minute safely 3. Stands for 1 minute with supervision 2. Unable to hold for 30 seconds            1. Needs help to attain position but can hold for 15 seconds            0. Needs help to attain position and unable to hold for 15 seconds Reaching forward with outstretched arm:            4. Reaches forward 10 inches            3. Reaches forward 5 inches            2. Reaches forward 2 inches            1. Reaches forward with supervision            0. Loses balance/requires assistace Retrieving object from the floor: Numbers; 0-4: 4 4. Able to pick up easily and safely 3. Able to pick up with supervision 2. Unable to pick up, but reaches within 1-2 inches independently 1. Unable to pick up and needs supervision 0. Unable/needs assistance to keep from falling  Turning to look behind: Numbers; 0-4: 2  4. Looks behind from both sides and weight shifts well            3. Looks behind one side only, other side less weight shift            2. Turns sideways only, maintains balance            1. Needs supervision when turning            0. Needs assistance  Turning 360 degrees: Numbers; 0-4: 2            4. Able to turn in </=4 seconds            3. Able to turn on one side in </= 4 seconds             2. Able to turn slowly, but safely            1. Needs supervision or verbal cueing            0. Needs assistance Place alternate foot on stool: Numbers; 0-4: 3 4. Completes 8 steps in 20 seconds 3. Completes 8 steps in >20 seconds 2. 4 steps without assistance/supervision 1. Completes >2 steps with minimal assist 0.  Unable, needs assist to keep from falling Standing with one foot in front: Numbers; 0-4: 1            4. Independent tandem for 30 seconds            3. Independent foot ahead for 30 seconds            2. Independent small step for 30 seconds            1. Needs help to step, but can hold for 15 seconds            0. Loses balance while standing/stepping Standing on one foot: Numbers; 0-4: 2 4. Holds >10 seconds 3. Holds 5-10 seconds 2. Holds >/=3 seconds  1. Holds <3 seconds 0. Unable    Total Score: 45/56  PATIENT EDUCATION:  Education details: PT/POC, balance, posture Person educated: Patient Education method: Explanation and Demonstration Education comprehension: verbalized understanding and needs further education   HOME EXERCISE PROGRAM: Access Code: [email or web address] URL: [email or web address] Date: 05/13/2022 Prepared by: Gar Ponto  Exercises - Supine Bridge  - 1 x daily - 7 x weekly - 2 sets - 10 reps - 3 hold - Hooklying Clamshell with Resistance  - 1 x daily - 7 x weekly - 3 sets - 10 reps - Seated Toe Towel Scrunches  - 1 x daily - 7 x weekly - 2 sets - 15 reps - Sit to Stand Without Arm Support  - 1 x daily - 7 x weekly - 2 sets - 10 reps - Seated Long Arc Quad  - 1 x daily - 7 x weekly - 2 sets - 10 reps - Standing Hip Abduction with Counter Support  - 1 x daily - 7 x weekly - 2 sets - 10 reps - Heel Toe Raises with Counter Support  - 1 x daily - 7 x weekly - 2 sets - 15 reps - 2 hold - Standing Single Leg Stance with Counter Support  - 1 x daily - 7 x weekly - 1 sets - 3 reps - 20 hold - Standing Tandem Balance with Counter Support  - 1 x daily - 7 x weekly - 1 sets - 3 reps - 20 hold   ASSESSMENT:  CLINICAL IMPRESSION: Therex was completed for trunk and LE strengthening and balance. Pt tolerated progressions with the physical demand of her session today. Pt indicates she is feeling more steady on her feet. HEP was modified to limit sidelying therex  which aggravates the pt's LBP. Pt demonstrates proper technique with her HEP.   OBJECTIVE IMPAIRMENTS: Abnormal gait, decreased activity tolerance, decreased balance, decreased cognition, decreased endurance, decreased knowledge of use of DME, decreased mobility, difficulty walking, decreased ROM, decreased strength, impaired sensation, improper body mechanics, postural dysfunction, and pain.    ACTIVITY LIMITATIONS: carrying, lifting, standing, stairs, transfers, and locomotion level   PARTICIPATION LIMITATIONS: cleaning, interpersonal relationship, shopping, and community activity   PERSONAL FACTORS: 1-2 comorbidities: osteoporosis, back pain  are also affecting patient's functional outcome.    REHAB POTENTIAL: Excellent   CLINICAL DECISION MAKING: Stable/uncomplicated   EVALUATION COMPLEXITY: Low     GOALS: Goals reviewed with patient? Yes   LONG TERM GOALS: Target date: 06/02/2022     Pt will be I with HEP for general strength, mobility, balance  Baseline: unknown Goal status: understanding current HEP 05/13/22   2.  Pt will be able to improve her Berg balance test by 6 points (51/56) to demo improved balance  Baseline: 45/56 Goal status: INITIAL   3.  Pt will be able to perform sit to stand in 13 sec or less  Baseline: 19 sec  Goal status: INITIAL   4.  Pt will be able to demonstrate improved ability to maintain upright spine with walking due to less back pain  Baseline:  Goal status: INITIAL   5.  Pt will be able to perform ADLs, light home tasks with no increase in back pain Baseline:  Goal status: INITIAL   PLAN:   PT FREQUENCY: 2x/week   PT DURATION: 6 weeks   PLANNED INTERVENTIONS: Therapeutic exercises, Therapeutic activity, Neuromuscular re-education, Balance training, Gait training, Patient/Family education, Self Care, Joint mobilization, Cryotherapy, Moist heat, Manual therapy, and Re-evaluation.   PLAN FOR NEXT SESSION: establish HEP , balance, TUG,  assess foot sensation   Tryston Gilliam MS, PT 05/13/22 4:04 PM

## 2022-05-14 ENCOUNTER — Encounter: Payer: Self-pay | Admitting: Adult Health

## 2022-05-14 ENCOUNTER — Ambulatory Visit (INDEPENDENT_AMBULATORY_CARE_PROVIDER_SITE_OTHER): Payer: PPO | Admitting: Adult Health

## 2022-05-14 VITALS — BP 130/60 | HR 82 | Temp 97.8°F | Ht <= 58 in | Wt 97.0 lb

## 2022-05-14 DIAGNOSIS — R2681 Unsteadiness on feet: Secondary | ICD-10-CM

## 2022-05-14 DIAGNOSIS — I10 Essential (primary) hypertension: Secondary | ICD-10-CM | POA: Diagnosis not present

## 2022-05-14 DIAGNOSIS — R4781 Slurred speech: Secondary | ICD-10-CM | POA: Diagnosis not present

## 2022-05-14 NOTE — Progress Notes (Signed)
Subjective:    Patient ID: Jennifer Donaldson, female    DOB: [birth year], 89 y.o.   MRN: [id number]  HPI 89 year old female who is being evaluated today for 1 month follow-up regarding hypertension  During her last visit her blood pressure was elevated at 150/70.  Norvasc was increased from 2.5 to 5 mg.  Since starting to increase and Norvasc she does report about a week ago she had to cut back to the 2.5 mg dose due to lower extremity edema. Edema has improved but is still present.   BP Readings from Last 3 Encounters:  05/14/22 130/60  04/15/22 (!) 150/70  03/12/22 (!) 144/70   Shee also started physical therapy for gait instability and is finding this helpful.  She does report that her gait seems to be improving and she is feeling stronger. She has not had any additional falls.   Furthermore, she also had a MR head angiogram done due to an episode of slurred speech.  MR angiogram showed no intracranial large vessel occlusion or hemodynamically significant stenosis. She has not had any additional neuro deficits    Review of Systems See HPI   Past Medical History:  Diagnosis Date   C. difficile colitis    CAD (coronary artery disease)    Dyslipidemia 12/24/2006   Qualifier: Diagnosis of  By: Sherren Mocha RN, Ellen     Hyperlipidemia    Mitral regurgitation    MITRAL REGURGITATION 08/12/2008   Qualifier: Diagnosis of  By: Burnett Kanaris     Osteopenia    Osteoporosis 03/01/2014   Raynaud's disease    RAYNAUD'S DISEASE 12/24/2006   Qualifier: Diagnosis of  By: Sherren Mocha RN, Dorian Pod     Special screening for malignant neoplasm of colon     Social History   Socioeconomic History   Marital status: Widowed    Spouse name: Not on file   Number of children: Not on file   Years of education: Not on file   Highest education level: 12th grade  Occupational History   Not on file  Tobacco Use   Smoking status: Former    Packs/day: 0.50    Years: 20.00    Total pack years: 10.00     Types: Cigarettes    Quit date: 09/01/1983    Years since quitting: 38.7   Smokeless tobacco: Never  Vaping Use   Vaping Use: Never used  Substance and Sexual Activity   Alcohol use: No    Alcohol/week: 0.0 standard drinks of alcohol   Drug use: No   Sexual activity: Yes    Birth control/protection: None    Comment: Married  Other Topics Concern   Not on file  Social History Narrative   ** Merged History Encounter **       Retired Engineer, structural Married for 54 years - husband has multiple medical issues and is disabled 4 children Grew up in [place] Strain: [place]  (05/10/2022)   Overall Financial Resource Strain ([place])    Difficulty of Paying Living Expenses: Not hard at all  Food Insecurity: No [place] (05/10/2022)   Hunger Vital Sign    Worried About Running Out of Food in the Last Year: Never true    [place] in the Last Year: Never true  Transportation Needs: No Transportation Needs (05/10/2022)   PRAPARE - Transportation    Lack of Transportation (Medical): No    Lack of  Transportation (Non-Medical): No  Physical Activity: Insufficiently Active (05/10/2022)   Exercise Vital Sign    Days of Exercise per Week: 2 days    Minutes of Exercise per Session: 30 min  Stress: Stress Concern Present (05/10/2022)   [place]    Feeling of Stress : To some extent  Social Connections: Moderately Integrated (05/10/2022)   Social Connection and Isolation Panel [NHANES]    Frequency of Communication with Friends and Family: More than three times a week    Frequency of Social Gatherings with Friends and Family: More than three times a week    Attends Religious Services: More than 4 times per year    Active Member of [employer] or Organizations: No    Attends Music therapist: More than 4 times per year    Marital Status:  Widowed  Intimate Partner Violence: Not At Risk (02/18/2022)   Humiliation, Afraid, Rape, and Kick questionnaire    Fear of Current or Ex-Partner: No    Emotionally Abused: No    Physically Abused: No    Sexually Abused: No    Past Surgical History:  Procedure Laterality Date   BREAST EXCISIONAL BIOPSY Left    CORONARY STENT PLACEMENT     2 prior drug eluting stent placed in the LAD and right coronary artery     Family History  Problem Relation Age of Onset   CAD Brother    Stroke Father    CAD Mother    Breast cancer Neg Hx     Allergies  Allergen Reactions   Celexa [Citalopram]     Confusion     Codeine Sulfate     REACTION: sensitivity to    Current Outpatient Medications on File Prior to Visit  Medication Sig Dispense Refill   alendronate (FOSAMAX) 70 MG tablet Take 70 mg by mouth once a week.  11   amLODipine (NORVASC) 5 MG tablet Take 1 tablet (5 mg total) by mouth daily. 90 tablet 0   atorvastatin (LIPITOR) 20 MG tablet TAKE 1 TABLET BY MOUTH DAILY AT 6 PM. 90 tablet 2   calcium citrate-vitamin D (CITRACAL+D) 315-200 MG-UNIT per tablet Take 2 tablets by mouth daily.      Cholecalciferol (VITAMIN D3) 1000 UNITS CAPS Take 1 tablet by mouth daily.     ezetimibe (ZETIA) 10 MG tablet Take 1 tablet (10 mg total) by mouth daily. 90 tablet 3   meloxicam (MOBIC) 15 MG tablet TAKE 1 (ONE) TABLET DAILY AS NEEDED FOR PAIN 90 tablet 0   metroNIDAZOLE (METROCREAM) 0.75 % cream Apply topically 2 (two) times daily.     omeprazole (PRILOSEC) 20 MG capsule TAKE 1 CAPSULE BY MOUTH EVERY DAY 90 capsule 3   aspirin 81 MG tablet Take 81 mg by mouth daily. (Patient not taking: Reported on 05/14/2022)     No current facility-administered medications on file prior to visit.    BP 130/60   Pulse 82   Temp 97.8 F (36.6 C) (Oral)   Ht '4\' 10"'$  (1.473 m)   Wt 97 lb (44 kg)   SpO2 96%   BMI 20.27 kg/m       Objective:   Physical Exam Vitals and nursing note reviewed.   Constitutional:      Appearance: Normal appearance.  Cardiovascular:     Rate and Rhythm: Normal rate and regular rhythm.     Pulses: Normal pulses.     Heart sounds: Normal heart sounds.  Pulmonary:     Effort: Pulmonary effort is normal.     Breath sounds: Normal breath sounds.  Musculoskeletal:        General: Normal range of motion.     Right lower leg: Edema (trace pitting) present.     Left lower leg: Edema (trace pitting) present.  Skin:    General: Skin is warm and dry.  Neurological:     General: No focal deficit present.     Mental Status: She is alert and oriented to person, place, and time.  Psychiatric:        Mood and Affect: Mood normal.        Behavior: Behavior normal.        Thought Content: Thought content normal.        Judgment: Judgment normal.        Assessment & Plan:  1. Primary hypertension - Continue on 2.5 mg Norvasc. Edema should resolve  - Can elevate legs above heart during rest   2. Slurred speech - Reviewed MR angiogram in detail with the patient. She felt reassured   3. Gait instability - Continue to work with PT   Dorothyann Peng, NP  Time spent with patient today was 32 minutes which consisted of chart review, discussing HTN, MRI results, and PT,  work up, treatment answering questions and documentation.

## 2022-05-20 ENCOUNTER — Ambulatory Visit: Payer: PPO | Attending: Adult Health

## 2022-05-20 DIAGNOSIS — R2681 Unsteadiness on feet: Secondary | ICD-10-CM | POA: Diagnosis not present

## 2022-05-20 DIAGNOSIS — M6281 Muscle weakness (generalized): Secondary | ICD-10-CM | POA: Insufficient documentation

## 2022-05-20 DIAGNOSIS — M5459 Other low back pain: Secondary | ICD-10-CM | POA: Insufficient documentation

## 2022-05-20 NOTE — Therapy (Addendum)
OUTPATIENT PHYSICAL THERAPY TREATMENT NOTE DISCHARGE   Patient Name: Jennifer Donaldson MRN: [id number] DOB:[birth year], 89 y.o., female Today's Date: 05/20/2022  PCP: Shirline Frees NP   REFERRING PROVIDER: Shirline Frees NP   END OF SESSION:   PT End of Session - 05/20/22 1433     Visit Number 5    Number of Visits 12    Date for PT Re-Evaluation 06/02/22    Authorization Type [employer]    PT Start Time 1425    PT Stop Time 1505    PT Time Calculation (min) 40 min    Activity Tolerance Patient tolerated treatment well    Behavior During Therapy [facility] for tasks assessed/performed              Past Medical History:  Diagnosis Date   C. difficile colitis    CAD (coronary artery disease)    Dyslipidemia 12/24/2006   Qualifier: Diagnosis of  By: Tawanna Cooler RN, Alvino Chapel     Hyperlipidemia    Mitral regurgitation    MITRAL REGURGITATION 08/12/2008   Qualifier: Diagnosis of  By: Kem Parkinson     Osteopenia    Osteoporosis 03/01/2014   Raynaud's disease    RAYNAUD'S DISEASE 12/24/2006   Qualifier: Diagnosis of  By: Tawanna Cooler, RN, Alvino Chapel     Special screening for malignant neoplasm of colon    Past Surgical History:  Procedure Laterality Date   BREAST EXCISIONAL BIOPSY Left    CORONARY STENT PLACEMENT     2 prior drug eluting stent placed in the LAD and right coronary artery    Patient Active Problem List   Diagnosis Date Noted   Trigger finger, right ring finger 11/24/2017   Follow up 08/30/2014   Osteoporosis 03/01/2014   MITRAL REGURGITATION 08/12/2008   CAD (coronary artery disease) 08/12/2008   Dyslipidemia 12/24/2006   RAYNAUD'S DISEASE 12/24/2006    REFERRING DIAG: R26.81 (ICD-10-CM) - Gait instability   THERAPY DIAG:  Gait instability  Muscle weakness (generalized)  Other low back pain  Rationale for Evaluation and Treatment Rehabilitation  SUBJECTIVE:                                                                                                                                                                                           SUBJECTIVE STATEMENT: Pt reports the mobility and balance in her home are better.   PAIN:  Are you having pain? Yes: NPRS scale:3/10 Pain location: Rt low back  Pain description: sore, tender Aggravating factors: AM, wake up, used to increase with activity, standing  Relieving factors: sitting down , rest   Pre-medicated today.   PERTINENT  HISTORY:  Osteoporosis, Raynaud's feet and hands , poor grip    PRECAUTIONS: Fall and Other: Neuro sx   WEIGHT BEARING RESTRICTIONS: No   FALLS:  Has patient fallen in last 6 months? Yes. 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  Of [employer]    PLOF: Independent recently her husband passed   PATIENT GOALS: I just want to feel better and get around better without hurting    NEXT MD VISIT:    OBJECTIVE: (objective measures completed at initial evaluation unless otherwise dated)    DIAGNOSTIC FINDINGS:  Brain angio uncoming    PATIENT SURVEYS:  FOTO 48%   SCREENING FOR RED FLAGS: Bowel or bladder incontinence: No Spinal tumors: No Cauda equina syndrome: No Compression fracture: No Abdominal aneurysm: No   COGNITION: Overall cognitive status: History of cognitive impairments - at baseline                               SENSATION: Hands and feet numb at times, due to Raynauds    MUSCLE LENGTH: Hamstrings: NT Thomas test: NT   POSTURE: rounded shoulders, forward head, increased thoracic kyphosis, and posterior pelvic tilt   PALPATION: nT    LUMBAR ROM:    AROM eval  Flexion NT osteo- porosis  Extension To neutral   Right lateral flexion WFL   Left lateral flexion Pain on L   Right rotation 50% limited   Left rotation 50% limited    (Blank rows = not tested)    LOWER EXTREMITY ROM:                          NT on eval Active  Right eval Left eval  Hip flexion      Hip extension      Hip abduction      Hip adduction      Hip internal rotation      Hip external rotation      Knee flexion      Knee extension      Ankle dorsiflexion      Ankle plantarflexion      Ankle inversion      Ankle eversion       (Blank rows = not tested)   LOWER EXTREMITY MMT:     MMT Right eval Left eval  Hip flexion 4 4  Hip extension      Hip abduction      Hip adduction      Hip internal rotation      Hip external rotation      Knee flexion 4+ 4+  Knee extension 4+ 4+  Ankle dorsiflexion      Ankle plantarflexion      Ankle inversion      Ankle eversion       (Blank rows = not tested)   LUMBAR SPECIAL TESTS:  NT on eval    FUNCTIONAL TESTS:  04/21/22: Sharlene Motts Balance score 45/56 TUG NT on eval; 11, 9.6=10.3" 5 x STS = 19 sec  30 sec STS : 9.5 reps 2 min walk test 4/10 back pain , 376 feet SLS: L 3,5=4"; R 8,6=7"        GAIT: Distance walked: 376 Assistive device utilized: None Level of assistance: Modified  independence Comments: irregular patterning/sway, mostly due to need for directional cues    TODAY'S TREATMENT:  [facility] Adult PT Treatment:                                                DATE: 05/20/22 Therapeutic Exercise: Nustep 6 min UE/LE L4 Seated trunk flexion forward and laterally LTR x10 Mini crunches 2x10 HL hip clams 2x15 GTB Seated LAQ 2x10 5# Standing ankle DF/PF 2x15 Standing hip abd 2x15 3# Standing hip ext 2x15 3#  [facility] Adult PT Treatment:                                                DATE: 05/13/22 Therapeutic Exercise: Nustep 6 min UE/LE L4 Seated towel scrunches bilat STS 2x10 HL hip clams 2x15 RTB Seated LAQ 2x10 Standing ankle DF/PF 2x15 Standing hip abd 2x15 3# Therapeutic Activity: SLS's at [facility] Marching on airex at [facility] SBA for safety Tandem stance on the airex at [facility] SBA for safety   [facility] Adult PT  Treatment:                                                DATE: 05/08/23 Therapeutic Exercise: Seated towel scrunches bilat STS x10 SL hip clams  2x15 SL hip abd 2x15 Therapeutic Activity: SLS's at [facility] Narrow stance on airex at [facility] Marching on airex at [facility] SBA for safety Tandem stance on the airex at [facility] SBA for safety                                                                                                                             DATE: 04/21/22    BERG BALANCE   Sitting to Standing: Numbers; 0-4: 4            4. Stands without using hands and stabilize independently            3. Stands independently using hands            2. Stands using hands after multiple trials            1. Min A to stand            0. Mod-Max A to stand Standing unsupported: Numbers; 0-4: 4            4. Stands safely for 2 minutes            3. Stands 2 minutes with supervision            2. Stands 30 seconds unsupported  1. Needs several tries to stand unsupported for 30 seconds            0. Unable to stand unsupported for 30 seconds Sitting unsupported: Numbers; 0-4: 4            4. Sits for 2 minutes independently            3. Sits for 2 minutes with supervision            2. Able to sit 30 seconds            1. Able to sit 10 seconds            0. Unable to sit for 10 seconds Standing to Sitting: Numbers; 0-4: 4 4. Sits safely with minimal use of hands 3. Controls descent with hands 2. Uses back of legs against chair to control descent 1. Sits independently, but uncontrolled descent 0. Needs assistance Transfers: Numbers; 0-4: 4            4. Transfers safely with minor use of hands            3. Transfers safely definite use of hands            2. Transfers with verbal cueing/supervision            1. Needs 1 person assist            0. Needs 2 person assist  Standing with eyes closed: Numbers; 0-4: 4            4. Stands safely for 10 seconds            3. Stands 10  seconds with supervision             2. Able to stand for 3 seconds            1. Unable to keep eyes closed for 3 seconds, but is safe            0. Needs assist to keep from falling Standing with feet together: Numbers; 0-4: 4 4. Stands for 1 minute safely 3. Stands for 1 minute with supervision 2. Unable to hold for 30 seconds            1. Needs help to attain position but can hold for 15 seconds            0. Needs help to attain position and unable to hold for 15 seconds Reaching forward with outstretched arm:            4. Reaches forward 10 inches            3. Reaches forward 5 inches            2. Reaches forward 2 inches            1. Reaches forward with supervision            0. Loses balance/requires assistace Retrieving object from the floor: Numbers; 0-4: 4 4. Able to pick up easily and safely 3. Able to pick up with supervision 2. Unable to pick up, but reaches within 1-2 inches independently 1. Unable to pick up and needs supervision 0. Unable/needs assistance to keep from falling  Turning to look behind: Numbers; 0-4: 2            4. Looks behind from both sides and weight shifts well            3. Looks  behind one side only, other side less weight shift            2. Turns sideways only, maintains balance            1. Needs supervision when turning            0. Needs assistance  Turning 360 degrees: Numbers; 0-4: 2            4. Able to turn in </=4 seconds            3. Able to turn on one side in </= 4 seconds             2. Able to turn slowly, but safely            1. Needs supervision or verbal cueing            0. Needs assistance Place alternate foot on stool: Numbers; 0-4: 3 4. Completes 8 steps in 20 seconds 3. Completes 8 steps in >20 seconds 2. 4 steps without assistance/supervision 1. Completes >2 steps with minimal assist 0. Unable, needs assist to keep from falling Standing with one foot in front: Numbers; 0-4: 1            4. Independent  tandem for 30 seconds            3. Independent foot ahead for 30 seconds            2. Independent small step for 30 seconds            1. Needs help to step, but can hold for 15 seconds            0. Loses balance while standing/stepping Standing on one foot: Numbers; 0-4: 2 4. Holds >10 seconds 3. Holds 5-10 seconds 2. Holds >/=3 seconds  1. Holds <3 seconds 0. Unable    Total Score: 45/56  PATIENT EDUCATION:  Education details: PT/POC, balance, posture Person educated: Patient Education method: Explanation and Demonstration Education comprehension: verbalized understanding and needs further education   HOME EXERCISE PROGRAM: Access Code: [email or web address] URL: [email or web address] Date: 05/20/2022 Prepared by: Joellyn Rued  Exercises - Supine Bridge  - 1 x daily - 7 x weekly - 2 sets - 10 reps - 3 hold - Hooklying Clamshell with Resistance  - 1 x daily - 7 x weekly - 3 sets - 10 reps - Seated Toe Towel Scrunches  - 1 x daily - 7 x weekly - 2 sets - 15 reps - Sit to Stand Without Arm Support  - 1 x daily - 7 x weekly - 2 sets - 10 reps - Seated Long Arc Quad  - 1 x daily - 7 x weekly - 2 sets - 10 reps - Standing Hip Abduction with Counter Support  - 1 x daily - 7 x weekly - 2 sets - 10 reps - Heel Toe Raises with Counter Support  - 1 x daily - 7 x weekly - 2 sets - 15 reps - 2 hold - Standing Single Leg Stance with Counter Support  - 1 x daily - 7 x weekly - 1 sets - 3 reps - 20 hold - Standing Tandem Balance with Counter Support  - 1 x daily - 7 x weekly - 1 sets - 3 reps - 20 hold - Supine March  - 1 x daily - 7 x weekly - 1 sets - 10 reps - Supine Lower Trunk Rotation  - 1 x daily -  7 x weekly - 1 sets - 10 reps - 3 hold - Curl Up with Reach  - 1 x daily - 7 x weekly - 1 sets - 10 reps - 3 hold   ASSESSMENT:   CLINICAL IMPRESSION: Therex was completed for trunk and LE strengthening and balance, and trunk flexibility. Therex today focused on trunk mobility c a  flexion bias and core strengthening. Pt noted it felt good to stretch out her low back. Pt tolerated PT today without adverse effects. Assess LTGs the next session.   OBJECTIVE IMPAIRMENTS: Abnormal gait, decreased activity tolerance, decreased balance, decreased cognition, decreased endurance, decreased knowledge of use of DME, decreased mobility, difficulty walking, decreased ROM, decreased strength, impaired sensation, improper body mechanics, postural dysfunction, and pain.    ACTIVITY LIMITATIONS: carrying, lifting, standing, stairs, transfers, and locomotion level   PARTICIPATION LIMITATIONS: cleaning, interpersonal relationship, shopping, and community activity   PERSONAL FACTORS: 1-2 comorbidities: osteoporosis, back pain  are also affecting patient's functional outcome.    REHAB POTENTIAL: Excellent   CLINICAL DECISION MAKING: Stable/uncomplicated   EVALUATION COMPLEXITY: Low     GOALS: Goals reviewed with patient? Yes   LONG TERM GOALS: Target date: 06/02/2022     Pt will be I with HEP for general strength, mobility, balance  Baseline: unknown Goal status: understanding current HEP 05/13/22   2.  Pt will be able to improve her Berg balance test by 6 points (51/56) to demo improved balance  Baseline: 45/56 Goal status: INITIAL   3.  Pt will be able to perform sit to stand in 13 sec or less  Baseline: 19 sec  Goal status: INITIAL   4.  Pt will be able to demonstrate improved ability to maintain upright spine with walking due to less back pain  Baseline:  Goal status: INITIAL   5.  Pt will be able to perform ADLs, light home tasks with no increase in back pain Baseline:  Goal status: INITIAL   PLAN:   PT FREQUENCY: 2x/week   PT DURATION: 6 weeks   PLANNED INTERVENTIONS: Therapeutic exercises, Therapeutic activity, Neuromuscular re-education, Balance training, Gait training, Patient/Family education, Self Care, Joint mobilization, Cryotherapy, Moist heat, Manual  therapy, and Re-evaluation.   PLAN FOR NEXT SESSION: establish HEP , balance, TUG, assess foot sensation   Ulysse Siemen MS, PT 05/20/22 3:25 PM     PHYSICAL THERAPY DISCHARGE SUMMARY  Visits from Start of Care: 5  Current functional level related to goals / functional outcomes: See above   Remaining deficits: see above   Education / Equipment: See above posture core balance HEP  Patient agrees to discharge. Patient goals were  unknown X . Patient is being discharged due to not returning since the last visit.   Karie Mainland, PT 07/22/22 11:15 AM Phone: [phone] Fax: [phone]

## 2022-05-22 ENCOUNTER — Ambulatory Visit: Payer: PPO

## 2022-05-22 NOTE — Therapy (Incomplete)
OUTPATIENT PHYSICAL THERAPY TREATMENT NOTE   Patient Name: Jennifer Donaldson MRN: [id number] DOB:[birth year], 89 y.o., female Today's Date: 05/22/2022  PCP: Jennifer Peng NP   REFERRING PROVIDER: Dorothyann Peng NP   END OF SESSION:      Past Medical History:  Diagnosis Date   C. difficile colitis    CAD (coronary artery disease)    Dyslipidemia 12/24/2006   Qualifier: Diagnosis of  By: Jennifer Mocha RN, Ellen     Hyperlipidemia    Mitral regurgitation    MITRAL REGURGITATION 08/12/2008   Qualifier: Diagnosis of  By: Jennifer Donaldson     Osteopenia    Osteoporosis 03/01/2014   Raynaud's disease    RAYNAUD'S DISEASE 12/24/2006   Qualifier: Diagnosis of  By: Jennifer Mocha RN, Jennifer Donaldson     Special screening for malignant neoplasm of colon    Past Surgical History:  Procedure Laterality Date   BREAST EXCISIONAL BIOPSY Left    CORONARY STENT PLACEMENT     2 prior drug eluting stent placed in the LAD and right coronary artery    Patient Active Problem List   Diagnosis Date Noted   Trigger finger, right ring finger 11/24/2017   Follow up 08/30/2014   Osteoporosis 03/01/2014   MITRAL REGURGITATION 08/12/2008   CAD (coronary artery disease) 08/12/2008   Dyslipidemia 12/24/2006   RAYNAUD'S DISEASE 12/24/2006    REFERRING DIAG: R26.81 (ICD-10-CM) - Gait instability   THERAPY DIAG:  No diagnosis found.  Rationale for Evaluation and Treatment Rehabilitation  SUBJECTIVE:                                                                                                                                                                                          SUBJECTIVE STATEMENT: Pt reports the mobility and balance in her home are better.   PAIN:  Are you having pain? Yes: NPRS scale:3/10 Pain location: Rt low back  Pain description: sore, tender Aggravating factors: AM, wake up, used to increase with activity, standing  Relieving factors: sitting down , rest   Pre-medicated today.    PERTINENT HISTORY:  Osteoporosis, Raynaud's feet and hands , poor grip    PRECAUTIONS: Fall and Other: Neuro sx   WEIGHT BEARING RESTRICTIONS: No   FALLS:  Has patient fallen in last 6 months? Yes. Number of falls 3   LIVING ENVIRONMENT: Lives with: lives alone Lives in: House/apartment Stairs: No Has following equipment at home: Nonehas cane and husbands equipment   OCCUPATION: Retired, was a Surveyor, quantity.  Of bookstore    PLOF: Independent recently her husband passed   PATIENT GOALS: I just want to feel better and get around better  without hurting    NEXT MD VISIT:    OBJECTIVE: (objective measures completed at initial evaluation unless otherwise dated)    DIAGNOSTIC FINDINGS:  Brain angio uncoming    PATIENT SURVEYS:  FOTO 48%   SCREENING FOR RED FLAGS: Bowel or bladder incontinence: No Spinal tumors: No Cauda equina syndrome: No Compression fracture: No Abdominal aneurysm: No   COGNITION: Overall cognitive status: History of cognitive impairments - at baseline                               SENSATION: Hands and feet numb at times, due to Raynauds    MUSCLE LENGTH: Hamstrings: NT Thomas test: NT   POSTURE: rounded shoulders, forward head, increased thoracic kyphosis, and posterior pelvic tilt   PALPATION: nT    LUMBAR ROM:    AROM eval  Flexion NT osteo- porosis  Extension To neutral   Right lateral flexion WFL   Left lateral flexion Pain on L   Right rotation 50% limited   Left rotation 50% limited    (Blank rows = not tested)   LOWER EXTREMITY ROM:                          NT on eval Active  Right eval Left eval  Hip flexion      Hip extension      Hip abduction      Hip adduction      Hip internal rotation      Hip external rotation      Knee flexion      Knee extension      Ankle dorsiflexion      Ankle plantarflexion      Ankle inversion      Ankle eversion       (Blank rows = not tested)   LOWER EXTREMITY MMT:     MMT  Right eval Left eval  Hip flexion 4 4  Hip extension      Hip abduction      Hip adduction      Hip internal rotation      Hip external rotation      Knee flexion 4+ 4+  Knee extension 4+ 4+  Ankle dorsiflexion      Ankle plantarflexion      Ankle inversion      Ankle eversion       (Blank rows = not tested)   LUMBAR SPECIAL TESTS:  NT on eval    FUNCTIONAL TESTS:  04/21/22: Jennifer Donaldson Balance score 45/56 TUG NT on eval; 11, 9.6=10.3" 5 x STS = 19 sec  30 sec STS : 9.5 reps 2 min walk test 4/10 back pain , 376 feet SLS: L 3,5=4"; R 8,6=7"        GAIT: Distance walked: 376 Assistive device utilized: None Level of assistance: Modified independence Comments: irregular patterning/sway, mostly due to need for directional cues    TODAY'S TREATMENT:  [facility] Adult PT Treatment:                                                DATE: 05/22/22 Therapeutic Exercise: *** Manual Therapy: *** Neuromuscular re-ed: *** Therapeutic Activity: *** Modalities: *** Self Care: Jennifer Donaldson Adult PT Treatment:  DATE: 05/20/22 Therapeutic Exercise: Nustep 6 min UE/LE L4 Seated trunk flexion forward and laterally LTR x10 Mini crunches 2x10 HL hip clams 2x15 GTB Seated LAQ 2x10 5# Standing ankle DF/PF 2x15 Standing hip abd 2x15 3# Standing hip ext 2x15 3#  [facility] Adult PT Treatment:                                                DATE: 05/13/22 Therapeutic Exercise: Nustep 6 min UE/LE L4 Seated towel scrunches bilat STS 2x10 HL hip clams 2x15 RTB Seated LAQ 2x10 Standing ankle DF/PF 2x15 Standing hip abd 2x15 3# Therapeutic Activity: SLS's at [facility] Marching on airex at [facility] SBA for safety Tandem stance on the airex at [facility] SBA for safety   [facility] Adult PT Treatment:                                                DATE: 05/08/23 Therapeutic Exercise: Seated towel scrunches bilat STS x10 SL hip clams  2x15 SL hip abd 2x15 Therapeutic Activity: SLS's  at [facility] Narrow stance on airex at [facility] Marching on airex at [facility] SBA for safety Tandem stance on the airex at [facility] SBA for safety                                                                                                                             DATE: 04/21/22    BERG BALANCE   Sitting to Standing: Numbers; 0-4: 4            4. Stands without using hands and stabilize independently            3. Stands independently using hands            2. Stands using hands after multiple trials            1. Min A to stand            0. Mod-Max A to stand Standing unsupported: Numbers; 0-4: 4            4. Stands safely for 2 minutes            3. Stands 2 minutes with supervision            2. Stands 30 seconds unsupported            1. Needs several tries to stand unsupported for 30 seconds            0. Unable to stand unsupported for 30 seconds Sitting unsupported: Numbers; 0-4: 4            4. Sits for 2 minutes independently  3. Sits for 2 minutes with supervision            2. Able to sit 30 seconds            1. Able to sit 10 seconds            0. Unable to sit for 10 seconds Standing to Sitting: Numbers; 0-4: 4 4. Sits safely with minimal use of hands 3. Controls descent with hands 2. Uses back of legs against chair to control descent 1. Sits independently, but uncontrolled descent 0. Needs assistance Transfers: Numbers; 0-4: 4            4. Transfers safely with minor use of hands            3. Transfers safely definite use of hands            2. Transfers with verbal cueing/supervision            1. Needs 1 person assist            0. Needs 2 person assist  Standing with eyes closed: Numbers; 0-4: 4            4. Stands safely for 10 seconds            3. Stands 10 seconds with supervision             2. Able to stand for 3 seconds            1. Unable to keep eyes closed for 3 seconds, but is safe            0. Needs assist to keep from falling Standing with feet  together: Numbers; 0-4: 4 4. Stands for 1 minute safely 3. Stands for 1 minute with supervision 2. Unable to hold for 30 seconds            1. Needs help to attain position but can hold for 15 seconds            0. Needs help to attain position and unable to hold for 15 seconds Reaching forward with outstretched arm:            4. Reaches forward 10 inches            3. Reaches forward 5 inches            2. Reaches forward 2 inches            1. Reaches forward with supervision            0. Loses balance/requires assistace Retrieving object from the floor: Numbers; 0-4: 4 4. Able to pick up easily and safely 3. Able to pick up with supervision 2. Unable to pick up, but reaches within 1-2 inches independently 1. Unable to pick up and needs supervision 0. Unable/needs assistance to keep from falling  Turning to look behind: Numbers; 0-4: 2            4. Looks behind from both sides and weight shifts well            3. Looks behind one side only, other side less weight shift            2. Turns sideways only, maintains balance            1. Needs supervision when turning            0. Needs assistance  Turning 360 degrees: Numbers; 0-4:  2            4. Able to turn in </=4 seconds            3. Able to turn on one side in </= 4 seconds             2. Able to turn slowly, but safely            1. Needs supervision or verbal cueing            0. Needs assistance Place alternate foot on stool: Numbers; 0-4: 3 4. Completes 8 steps in 20 seconds 3. Completes 8 steps in >20 seconds 2. 4 steps without assistance/supervision 1. Completes >2 steps with minimal assist 0. Unable, needs assist to keep from falling Standing with one foot in front: Numbers; 0-4: 1            4. Independent tandem for 30 seconds            3. Independent foot ahead for 30 seconds            2. Independent small step for 30 seconds            1. Needs help to step, but can hold for 15 seconds            0.  Loses balance while standing/stepping Standing on one foot: Numbers; 0-4: 2 4. Holds >10 seconds 3. Holds 5-10 seconds 2. Holds >/=3 seconds  1. Holds <3 seconds 0. Unable    Total Score: 45/56  PATIENT EDUCATION:  Education details: PT/POC, balance, posture Person educated: Patient Education method: Explanation and Demonstration Education comprehension: verbalized understanding and needs further education   HOME EXERCISE PROGRAM: Access Code: [email or web address] URL: [email or web address] Date: 05/20/2022 Prepared by: Gar Ponto  Exercises - Supine Bridge  - 1 x daily - 7 x weekly - 2 sets - 10 reps - 3 hold - Hooklying Clamshell with Resistance  - 1 x daily - 7 x weekly - 3 sets - 10 reps - Seated Toe Towel Scrunches  - 1 x daily - 7 x weekly - 2 sets - 15 reps - Sit to Stand Without Arm Support  - 1 x daily - 7 x weekly - 2 sets - 10 reps - Seated Long Arc Quad  - 1 x daily - 7 x weekly - 2 sets - 10 reps - Standing Hip Abduction with Counter Support  - 1 x daily - 7 x weekly - 2 sets - 10 reps - Heel Toe Raises with Counter Support  - 1 x daily - 7 x weekly - 2 sets - 15 reps - 2 hold - Standing Single Leg Stance with Counter Support  - 1 x daily - 7 x weekly - 1 sets - 3 reps - 20 hold - Standing Tandem Balance with Counter Support  - 1 x daily - 7 x weekly - 1 sets - 3 reps - 20 hold - Supine March  - 1 x daily - 7 x weekly - 1 sets - 10 reps - Supine Lower Trunk Rotation  - 1 x daily - 7 x weekly - 1 sets - 10 reps - 3 hold - Curl Up with Reach  - 1 x daily - 7 x weekly - 1 sets - 10 reps - 3 hold   ASSESSMENT:   CLINICAL IMPRESSION: Therex was completed for trunk and LE strengthening and balance, and trunk flexibility. Therex today focused on trunk mobility  c a flexion bias and core strengthening. Pt noted it felt good to stretch out her low back. Pt tolerated PT today without adverse effects. Assess LTGs the next session.   OBJECTIVE IMPAIRMENTS: Abnormal gait,  decreased activity tolerance, decreased balance, decreased cognition, decreased endurance, decreased knowledge of use of DME, decreased mobility, difficulty walking, decreased ROM, decreased strength, impaired sensation, improper body mechanics, postural dysfunction, and pain.    ACTIVITY LIMITATIONS: carrying, lifting, standing, stairs, transfers, and locomotion level   PARTICIPATION LIMITATIONS: cleaning, interpersonal relationship, shopping, and community activity   PERSONAL FACTORS: 1-2 comorbidities: osteoporosis, back pain  are also affecting patient's functional outcome.    REHAB POTENTIAL: Excellent   CLINICAL DECISION MAKING: Stable/uncomplicated   EVALUATION COMPLEXITY: Low     GOALS: Goals reviewed with patient? Yes   LONG TERM GOALS: Target date: 06/02/2022     Pt will be I with HEP for general strength, mobility, balance  Baseline: unknown Goal status: understanding current HEP 05/13/22   2.  Pt will be able to improve her Berg balance test by 6 points (51/56) to demo improved balance  Baseline: 45/56 Goal status: INITIAL   3.  Pt will be able to perform sit to stand in 13 sec or less  Baseline: 19 sec  Goal status: INITIAL   4.  Pt will be able to demonstrate improved ability to maintain upright spine with walking due to less back pain  Baseline:  Goal status: INITIAL   5.  Pt will be able to perform ADLs, light home tasks with no increase in back pain Baseline:  Goal status: INITIAL   PLAN:   PT FREQUENCY: 2x/week   PT DURATION: 6 weeks   PLANNED INTERVENTIONS: Therapeutic exercises, Therapeutic activity, Neuromuscular re-education, Balance training, Gait training, Patient/Family education, Self Care, Joint mobilization, Cryotherapy, Moist heat, Manual therapy, and Re-evaluation.   PLAN FOR NEXT SESSION: establish HEP , balance, TUG, assess foot sensation   Makena Murdock MS, PT 05/22/22 7:56 AM

## 2022-05-27 ENCOUNTER — Ambulatory Visit: Payer: PPO

## 2022-05-29 ENCOUNTER — Ambulatory Visit: Payer: PPO

## 2022-06-11 ENCOUNTER — Other Ambulatory Visit: Payer: Self-pay | Admitting: Adult Health

## 2022-06-15 IMAGING — DX DG CHEST 2V
2 series · 2 of 2 positions shown · non-contrast
Comparison: 11/29/2004

CLINICAL DATA: Sick for 2 weeks, loss of appetite cough, sore
throat with coughing, negative 6DIPY-F1 test a week ago, vaccinated,
past history of coronary artery disease, former smoker

EXAM:
CHEST - 2 VIEW

[chest pa]
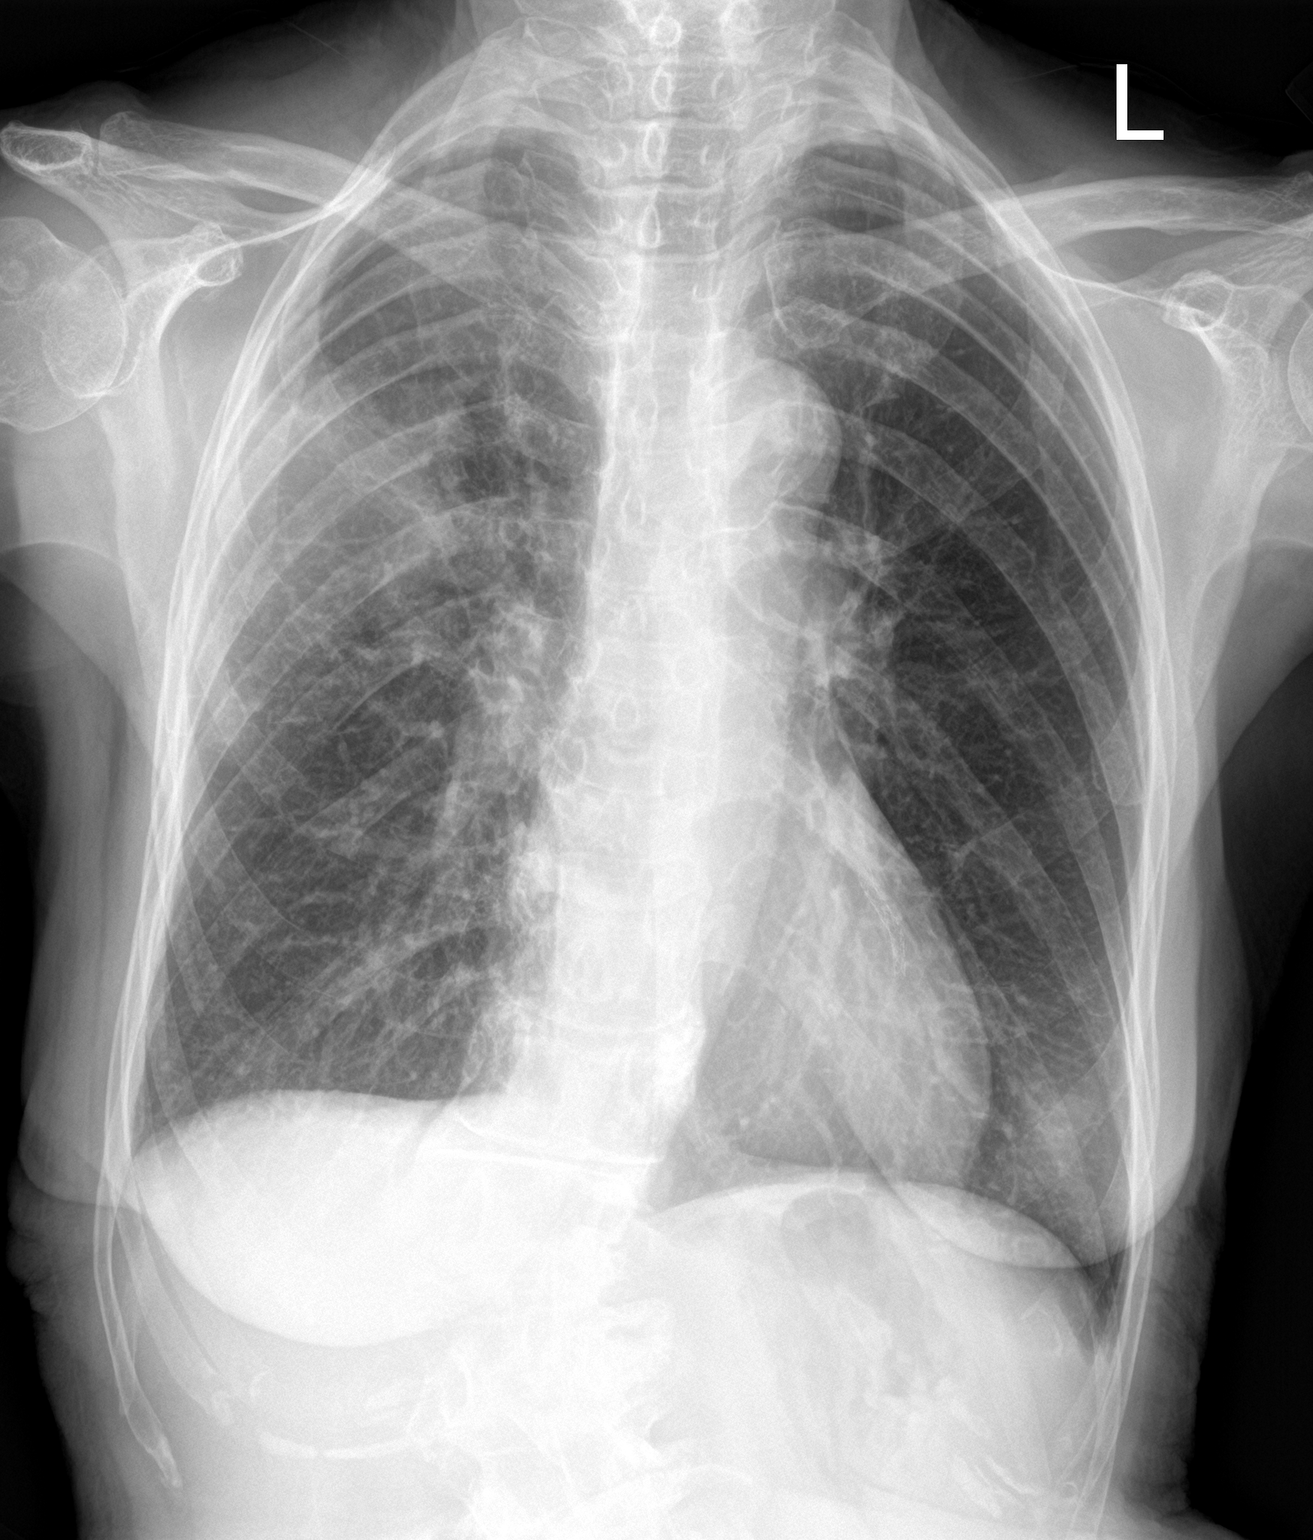

[chest lat]
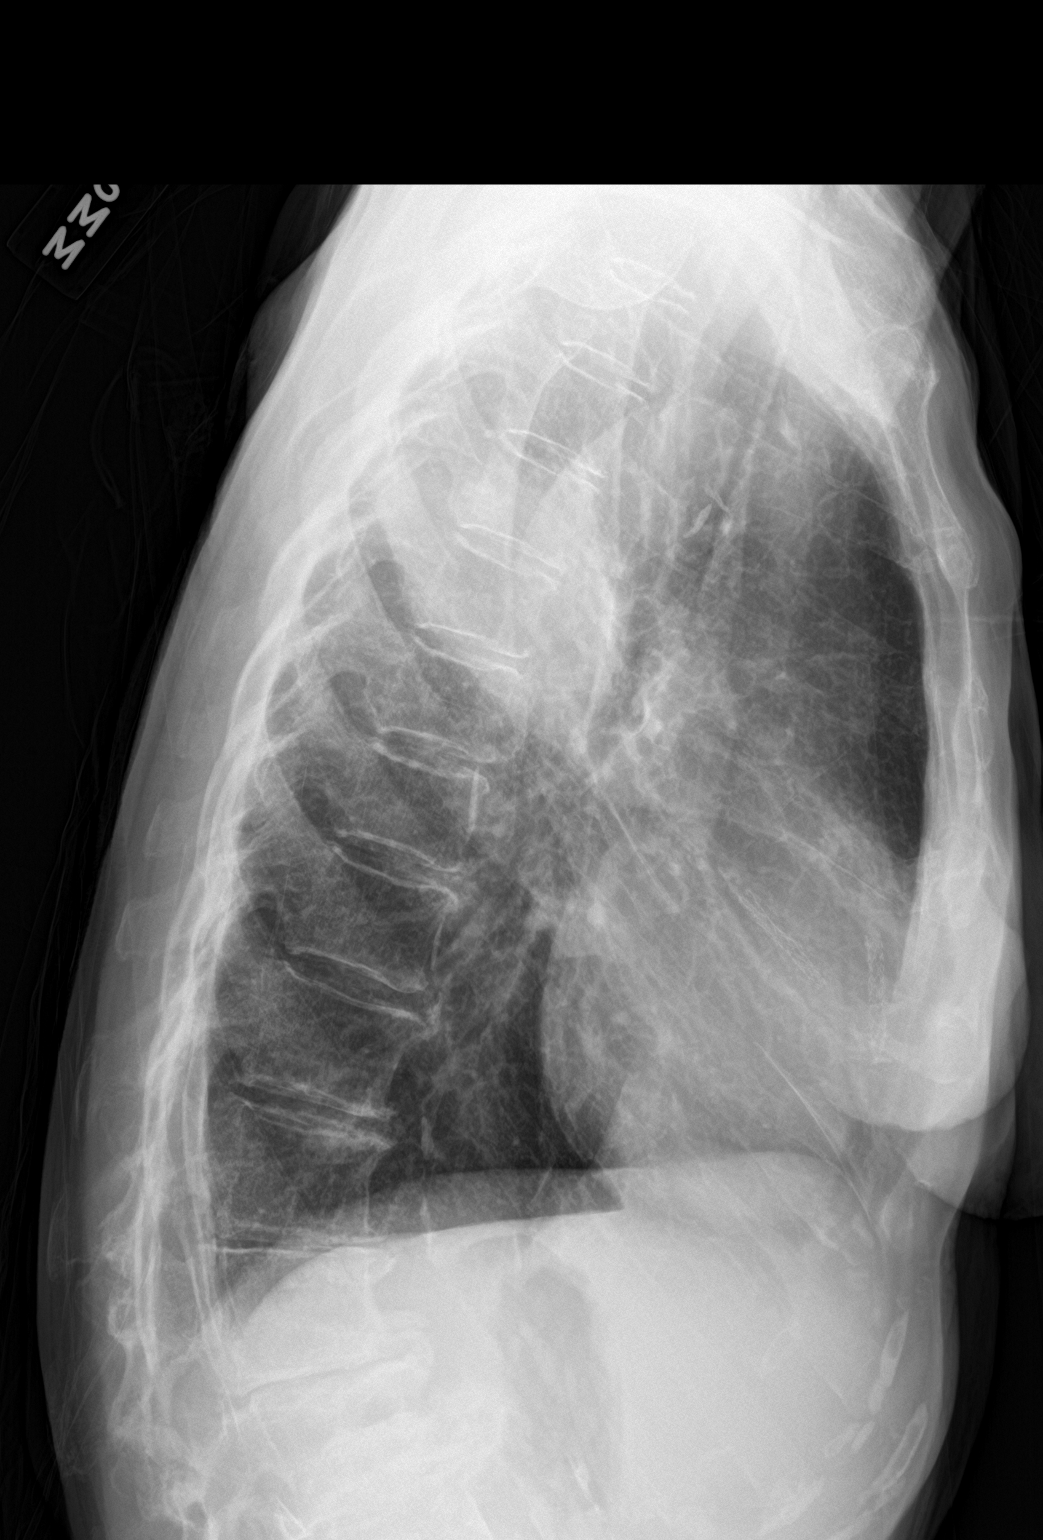

[2 of 2 positions shown; findings below may reference images not displayed]

FINDINGS: Normal heart size, mediastinal contours, and pulmonary vascularity.

Atherosclerotic calcification aorta.

Focal RIGHT upper lobe opacity adjacent to the major fissure,
appears masslike on lateral view measuring approximately 5.4 x 5.3 x
5.8 cm, question infiltrate versus mass.

Underlying emphysematous changes.

No additional infiltrate, pleural effusion or pneumothorax.

Osseous structures demineralized.

Degenerative disc disease changes and focal kyphosis at
thoracolumbar junction.
IMPRESSION: Emphysematous changes with question mass versus infiltrate RIGHT
upper lobe; either radiographic follow-up until resolution or
further evaluation by CT chest with contrast recommended to exclude
malignancy.

Aortic Atherosclerosis ([id number]).

These results will be called to the ordering clinician or
representative by the Radiologist Assistant, and communication
documented in the PACS or [REDACTED].

## 2022-06-17 DIAGNOSIS — Z961 Presence of intraocular lens: Secondary | ICD-10-CM | POA: Diagnosis not present

## 2022-06-17 DIAGNOSIS — H43813 Vitreous degeneration, bilateral: Secondary | ICD-10-CM | POA: Diagnosis not present

## 2022-06-17 DIAGNOSIS — H10509 Unspecified blepharoconjunctivitis, unspecified eye: Secondary | ICD-10-CM | POA: Diagnosis not present

## 2022-06-17 DIAGNOSIS — H40053 Ocular hypertension, bilateral: Secondary | ICD-10-CM | POA: Diagnosis not present

## 2022-07-03 ENCOUNTER — Ambulatory Visit
Admission: RE | Admit: 2022-07-03 | Discharge: 2022-07-03 | Disposition: A | Discharge status: Home / Self Care | Payer: PPO | Source: Ambulatory Visit | Attending: Nurse Practitioner | Admitting: Nurse Practitioner

## 2022-07-03 DIAGNOSIS — M85852 Other specified disorders of bone density and structure, left thigh: Secondary | ICD-10-CM | POA: Diagnosis not present

## 2022-07-03 DIAGNOSIS — Z8739 Personal history of other diseases of the musculoskeletal system and connective tissue: Secondary | ICD-10-CM

## 2022-07-03 DIAGNOSIS — Z780 Asymptomatic menopausal state: Secondary | ICD-10-CM | POA: Diagnosis not present

## 2022-07-03 DIAGNOSIS — M810 Age-related osteoporosis without current pathological fracture: Secondary | ICD-10-CM | POA: Diagnosis not present

## 2022-07-12 ENCOUNTER — Other Ambulatory Visit: Payer: Self-pay | Admitting: Adult Health

## 2022-07-12 DIAGNOSIS — I10 Essential (primary) hypertension: Secondary | ICD-10-CM

## 2022-07-23 DIAGNOSIS — L298 Other pruritus: Secondary | ICD-10-CM | POA: Diagnosis not present

## 2022-07-23 DIAGNOSIS — L309 Dermatitis, unspecified: Secondary | ICD-10-CM | POA: Diagnosis not present

## 2022-08-08 IMAGING — DX DG CHEST 2V
2 series · 2 of 2 positions shown · non-contrast
Comparison: January 18, 2020

CLINICAL DATA: Pneumonia follow-up

EXAM:
CHEST - 2 VIEW

[chest pa]
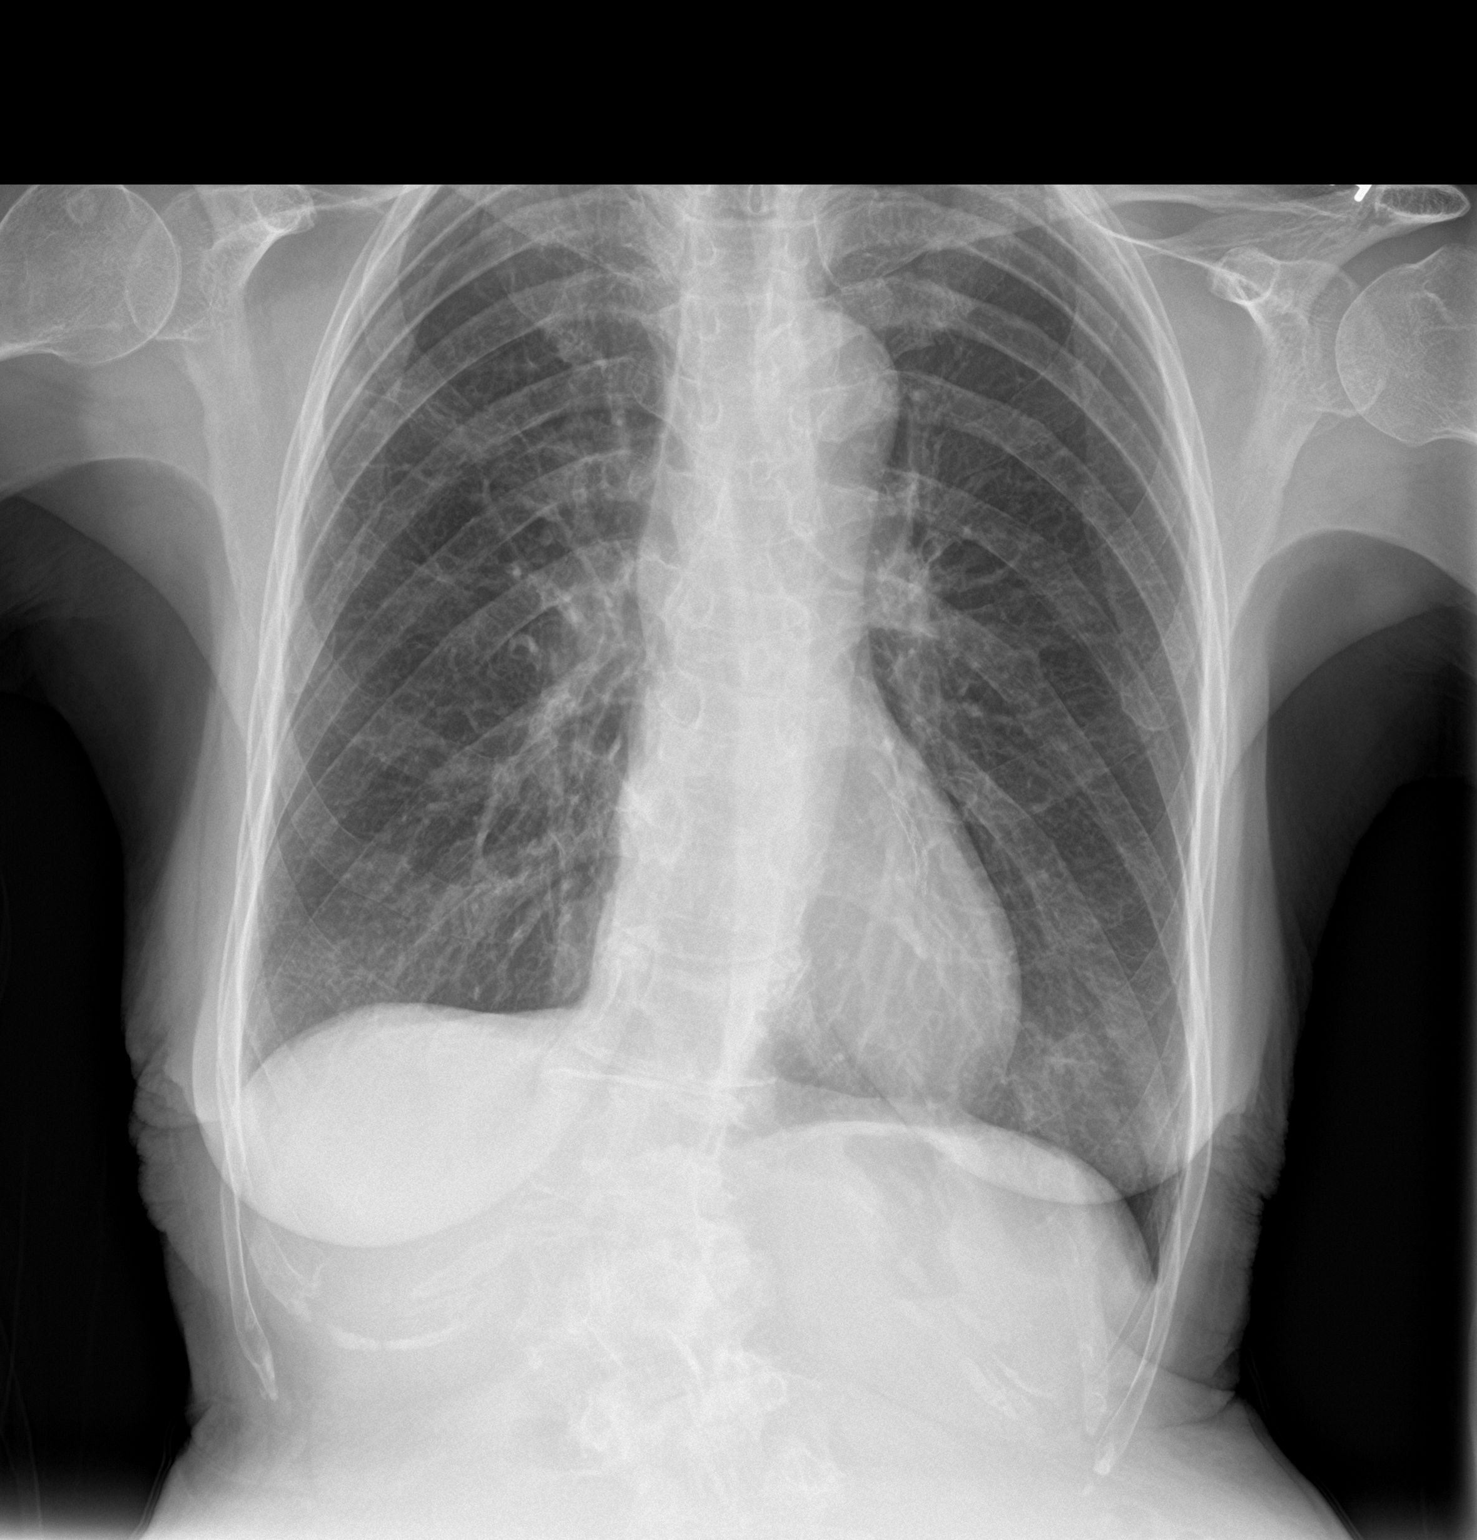

[chest lat]
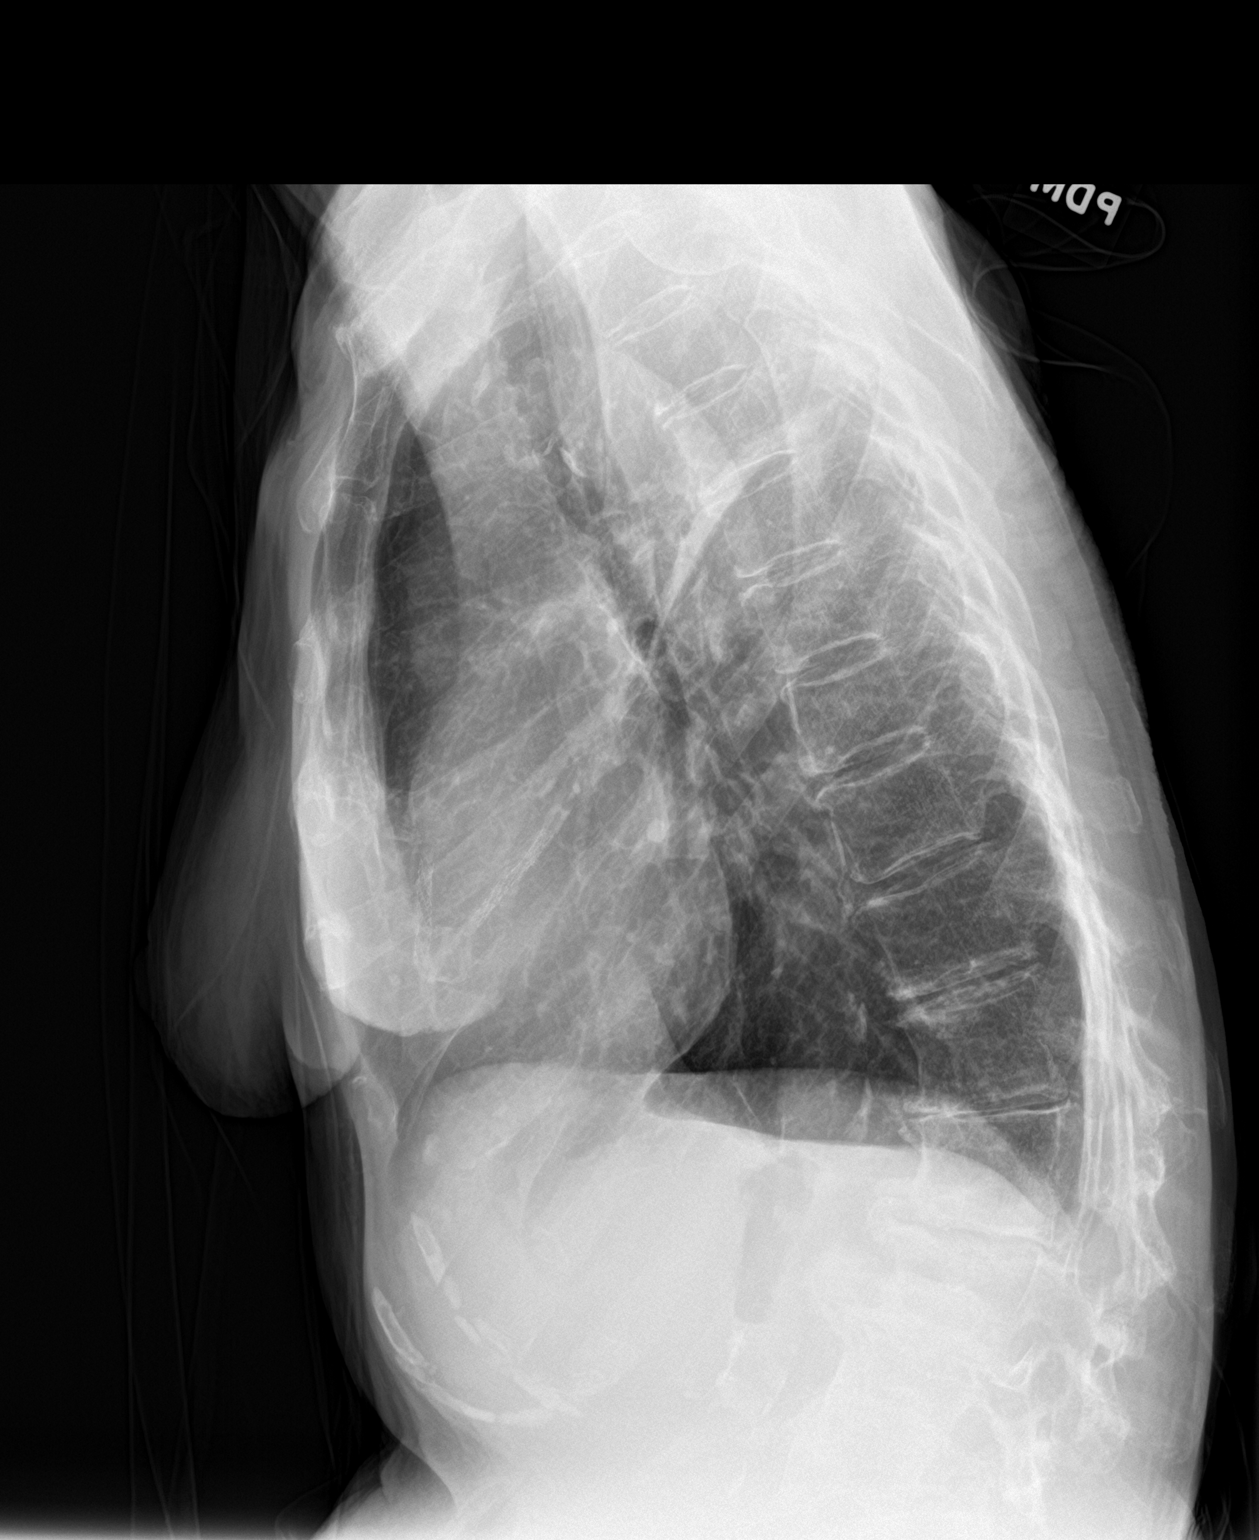

[2 of 2 positions shown; findings below may reference images not displayed]

FINDINGS: Minimal residual density in the right upper lobe has decreased. No
new consolidation. No pleural effusion. Normal heart size. No acute
osseous abnormality.
IMPRESSION: Minimal residual density in the right upper lobe has further
decreased.

## 2022-08-19 ENCOUNTER — Ambulatory Visit (INDEPENDENT_AMBULATORY_CARE_PROVIDER_SITE_OTHER): Payer: PPO | Admitting: Adult Health

## 2022-08-19 ENCOUNTER — Encounter: Payer: Self-pay | Admitting: Adult Health

## 2022-08-19 VITALS — BP 130/60 | HR 72 | Temp 97.5°F | Ht <= 58 in | Wt 96.8 lb

## 2022-08-19 DIAGNOSIS — R350 Frequency of micturition: Secondary | ICD-10-CM

## 2022-08-19 LAB — POCT URINALYSIS DIPSTICK
Blood, UA: NEGATIVE
Glucose, UA: NEGATIVE
Nitrite, UA: POSITIVE
Protein, UA: POSITIVE — AB
Spec Grav, UA: 1.025 (ref 1.010–1.025)
Urobilinogen, UA: 1 E.U./dL
pH, UA: 5 (ref 5.0–8.0)

## 2022-08-19 MED ORDER — AMOXICILLIN-POT CLAVULANATE 875-125 MG PO TABS: 1.0000 | ORAL_TABLET | Freq: Two times a day (BID) | ORAL | 0 refills | Status: AC

## 2022-08-19 NOTE — Progress Notes (Signed)
Subjective:    Patient ID: Jennifer Donaldson, female    DOB: 12-02-1932, 89 y.o.   MRN: [id number]  HPI 89 year old female who  has a past medical history of C. difficile colitis, CAD (coronary artery disease), Dyslipidemia (12/24/2006), Hyperlipidemia, Mitral regurgitation, MITRAL REGURGITATION (08/12/2008), Osteopenia, Osteoporosis (03/01/2014), Raynaud's disease, RAYNAUD'S DISEASE (12/24/2006), and Special screening for malignant neoplasm of colon.  She presents to the office today for concern of a UTI. Her symptoms started about   Symptoms include pain with urination " not burning"  lower abdominal pressure, low back pain, confusion ( word finding issues), frequency, and urgency.   Review of Systems See HPI   Past Medical History:  Diagnosis Date   C. difficile colitis    CAD (coronary artery disease)    Dyslipidemia 12/24/2006   Qualifier: Diagnosis of  By: Tawanna Cooler RN, Ellen     Hyperlipidemia    Mitral regurgitation    MITRAL REGURGITATION 08/12/2008   Qualifier: Diagnosis of  By: Kem Parkinson     Osteopenia    Osteoporosis 03/01/2014   Raynaud's disease    RAYNAUD'S DISEASE 12/24/2006   Qualifier: Diagnosis of  By: Tawanna Cooler RN, Alvino Chapel     Special screening for malignant neoplasm of colon     Social History   Socioeconomic History   Marital status: Widowed    Spouse name: Not on file   Number of children: Not on file   Years of education: Not on file   Highest education level: 12th grade  Occupational History   Not on file  Tobacco Use   Smoking status: Former    Packs/day: 0.50    Years: 20.00    Additional pack years: 0.00    Total pack years: 10.00    Types: Cigarettes    Quit date: 09/01/1983    Years since quitting: 38.9   Smokeless tobacco: Never  Vaping Use   Vaping Use: Never used  Substance and Sexual Activity   Alcohol use: No    Alcohol/week: 0.0 standard drinks of alcohol   Drug use: No   Sexual activity: Yes    Birth control/protection: None     Comment: Married  Other Topics Concern   Not on file  Social History Narrative   ** Merged History Encounter **       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 4 children Grew up in [place]   Social Determinants of Health   Financial Resource Strain: Low Risk  (05/10/2022)   Overall Financial Resource Strain (CARDIA)    Difficulty of Paying Living Expenses: Not hard at all  Food Insecurity: No Food Insecurity (05/10/2022)   Hunger Vital Sign    Worried About Running Out of Food in the Last Year: Never true    Ran Out of Food in the Last Year: Never true  Transportation Needs: No Transportation Needs (05/10/2022)   PRAPARE - Administrator, Civil Service (Medical): No    Lack of Transportation (Non-Medical): No  Physical Activity: Insufficiently Active (05/10/2022)   Exercise Vital Sign    Days of Exercise per Week: 2 days    Minutes of Exercise per Session: 30 min  Stress: Stress Concern Present (05/10/2022)   [employer] of Occupational Health - Occupational Stress Questionnaire    Feeling of Stress : To some extent  Social Connections: Moderately Integrated (05/10/2022)   Social Connection and Isolation Panel [NHANES]  Frequency of Communication with Friends and Family: More than three times a week    Frequency of Social Gatherings with Friends and Family: More than three times a week    Attends Religious Services: More than 4 times per year    Active Member of Clubs or Organizations: No    Attends Engineer, structural: More than 4 times per year    Marital Status: Widowed  Intimate Partner Violence: Not At Risk (02/18/2022)   Humiliation, Afraid, Rape, and Kick questionnaire    Fear of Current or Ex-Partner: No    Emotionally Abused: No    Physically Abused: No    Sexually Abused: No    Past Surgical History:  Procedure Laterality Date   BREAST EXCISIONAL BIOPSY Left     CORONARY STENT PLACEMENT     2 prior drug eluting stent placed in the LAD and right coronary artery     Family History  Problem Relation Age of Onset   CAD Brother    Stroke Father    CAD Mother    Breast cancer Neg Hx     Allergies  Allergen Reactions   Celexa [Citalopram]     Confusion     Codeine Sulfate     REACTION: sensitivity to    Current Outpatient Medications on File Prior to Visit  Medication Sig Dispense Refill   alendronate (FOSAMAX) 70 MG tablet Take 70 mg by mouth once a week.  11   amLODipine (NORVASC) 2.5 MG tablet TAKE 1 TABLET BY MOUTH EVERY DAY 90 tablet 1   atorvastatin (LIPITOR) 20 MG tablet TAKE 1 TABLET BY MOUTH DAILY AT 6 PM. 90 tablet 2   calcium citrate-vitamin D (CITRACAL+D) 315-200 MG-UNIT per tablet Take 2 tablets by mouth daily.      Cholecalciferol (VITAMIN D3) 1000 UNITS CAPS Take 1 tablet by mouth daily.     ezetimibe (ZETIA) 10 MG tablet Take 1 tablet (10 mg total) by mouth daily. 90 tablet 3   meloxicam (MOBIC) 15 MG tablet TAKE 1 (ONE) TABLET DAILY AS NEEDED FOR PAIN 90 tablet 0   metroNIDAZOLE (METROCREAM) 0.75 % cream Apply topically 2 (two) times daily.     omeprazole (PRILOSEC) 20 MG capsule TAKE 1 CAPSULE BY MOUTH EVERY DAY 90 capsule 3   Cholecalciferol (VITAMIN D3) 1000 units CAPS 1 tablet Orally Once a day     No current facility-administered medications on file prior to visit.    BP 130/60   Pulse 72   Temp (!) 97.5 F (36.4 C) (Oral)   Ht 4\' 10"  (1.473 m)   Wt 96 lb 12.8 oz (43.9 kg)   SpO2 99%   BMI 20.23 kg/m       Objective:   Physical Exam Vitals and nursing note reviewed.  Constitutional:      Appearance: Normal appearance.  Cardiovascular:     Rate and Rhythm: Regular rhythm.     Pulses: Normal pulses.     Heart sounds: Normal heart sounds.  Pulmonary:     Effort: Pulmonary effort is normal.     Breath sounds: Normal breath sounds.  Abdominal:     General: Abdomen is flat. Bowel sounds are normal. There  is no distension.     Palpations: Abdomen is soft. There is no mass.     Tenderness: There is abdominal tenderness in the suprapubic area. There is no right CVA tenderness, left CVA tenderness or rebound.  Musculoskeletal:        General:  Normal range of motion.  Skin:    General: Skin is warm and dry.  Neurological:     General: No focal deficit present.     Mental Status: She is oriented to person, place, and time.  Psychiatric:        Mood and Affect: Mood normal.        Behavior: Behavior normal.        Thought Content: Thought content normal.        Cognition and Memory: Cognition is impaired. Memory is impaired.        Judgment: Judgment normal.       Assessment & Plan:  1. Frequent urination  - POC Urinalysis Dipstick + leuks, nit, protein, bili.  - Culture, Urine; Future - amoxicillin-clavulanate (AUGMENTIN) 875-125 MG tablet; Take 1 tablet by mouth 2 (two) times daily for 5 days.  Dispense: 10 tablet; Refill: 0  Shirline Frees, NP

## 2022-08-21 LAB — URINE CULTURE
MICRO NUMBER:: 14923986
SPECIMEN QUALITY:: ADEQUATE

## 2022-08-22 ENCOUNTER — Telehealth: Payer: Self-pay | Admitting: Adult Health

## 2022-08-22 NOTE — Telephone Encounter (Addendum)
Pt's Daughter Lupita Leash) called, returning CMA's call. CMA was with a patient. Pt asked that CMA call back at her earliest convenience. [phone]  She also asked if we see unvaccinated children. She was asking for a child of 3 yrs of age.  As per Office Manager: if child has Medicaid, she must take child to the Health Dept.

## 2022-08-22 NOTE — Telephone Encounter (Addendum)
Patient notified of update  and verbalized understanding. Pt advised she will update her daughter.

## 2022-09-20 ENCOUNTER — Other Ambulatory Visit: Payer: Self-pay | Admitting: Internal Medicine

## 2022-10-20 DIAGNOSIS — L309 Dermatitis, unspecified: Secondary | ICD-10-CM | POA: Diagnosis not present

## 2022-10-20 DIAGNOSIS — L298 Other pruritus: Secondary | ICD-10-CM | POA: Diagnosis not present

## 2022-12-11 ENCOUNTER — Other Ambulatory Visit: Payer: Self-pay | Admitting: Adult Health

## 2023-01-02 ENCOUNTER — Other Ambulatory Visit: Payer: Self-pay | Admitting: Adult Health

## 2023-01-02 DIAGNOSIS — M159 Polyosteoarthritis, unspecified: Secondary | ICD-10-CM

## 2023-01-30 NOTE — Progress Notes (Unsigned)
Cardiology Office Note   Date:  02/02/2023   ID:  [name], DOB 1932-06-09, MRN [id number]  PCP:  Shirline Frees, NP  Cardiologist:   Dietrich Pates, MD   Patinet presents for f/u of CAD     History of Present Illness: Jennifer Donaldson is a 89 y.o. female with a history of CAD  She is s/p PCI/DES to LAD and RCA  Last  [facility] in 2021 showed no ischemi  I saw the pt in Nov 2023     Since seen she denies CP  Breathing is OK  She walks around house but not outside for organized exercise     Denies dizziness  She did fall one night however   Cut head     Does note some memory issues     Outpatient Medications Prior to Visit  Medication Sig Dispense Refill   alendronate (FOSAMAX) 70 MG tablet Take 70 mg by mouth once a week.  11   amLODipine (NORVASC) 2.5 MG tablet TAKE 1 TABLET BY MOUTH EVERY DAY 90 tablet 1   atorvastatin (LIPITOR) 20 MG tablet TAKE 1 TABLET BY MOUTH DAILY AT 6 PM. 90 tablet 2   calcium citrate-vitamin D (CITRACAL+D) 315-200 MG-UNIT per tablet Take 2 tablets by mouth daily.      Cholecalciferol (VITAMIN D3) 1000 units CAPS 1 tablet Orally Once a day     ezetimibe (ZETIA) 10 MG tablet Take 1 tablet (10 mg total) by mouth daily. 90 tablet 3   meloxicam (MOBIC) 15 MG tablet TAKE 1 (ONE) TABLET DAILY AS NEEDED FOR PAIN 90 tablet 0   metroNIDAZOLE (METROCREAM) 0.75 % cream Apply topically 2 (two) times daily.     omeprazole (PRILOSEC) 20 MG capsule TAKE 1 CAPSULE BY MOUTH EVERY DAY 90 capsule 3   Cholecalciferol (VITAMIN D3) 1000 UNITS CAPS Take 1 tablet by mouth daily.     No facility-administered medications prior to visit.     Allergies:   Celexa [citalopram] and Codeine sulfate   Past Medical History:  Diagnosis Date   C. difficile colitis    CAD (coronary artery disease)    Dyslipidemia 12/24/2006   Qualifier: Diagnosis of  By: Tawanna Cooler RN, Ellen     Hyperlipidemia    Mitral regurgitation    MITRAL REGURGITATION 08/12/2008   Qualifier:  Diagnosis of  By: Kem Parkinson     Osteopenia    Osteoporosis 03/01/2014   Raynaud's disease    RAYNAUD'S DISEASE 12/24/2006   Qualifier: Diagnosis of  By: Tawanna Cooler RN, Alvino Chapel     Special screening for malignant neoplasm of colon     Past Surgical History:  Procedure Laterality Date   BREAST EXCISIONAL BIOPSY Left    CORONARY STENT PLACEMENT     2 prior drug eluting stent placed in the LAD and right coronary artery      Social History:  The patient  reports that she quit smoking about 39 years ago. Her smoking use included cigarettes. She started smoking about 59 years ago. She has a 10 pack-year smoking history. She has never used smokeless tobacco. She reports that she does not drink alcohol and does not use drugs.   Family History:  The patient's family history includes CAD in her brother and mother; Stroke in her father.    ROS:  Please see the history of present illness. All other systems are reviewed and  Negative to the above problem except as noted.    PHYSICAL EXAM: VS:  BP (!) 158/72   Pulse 78   Ht 4\' 10"  (1.473 m)   Wt 97 lb (44 kg)   SpO2 95%   BMI 20.27 kg/m   GEN  Thin 59d yo n no acute distress  HEENT: normal  Neck: JVP is normal   No bruits  Cardiac: RRR; no murmurs  No LE  edema Respiratory:  clear to auscultation bilaterally,  GI: soft, nontender  No hepatomegaly     EKG:  EKG is ordered today   SR 81 bpm    Lipid Panel    Component Value Date/Time   CHOL 165 12/07/2020 1108   CHOL 166 02/16/2018 0801   TRIG 76.0 12/07/2020 1108   HDL 82.20 12/07/2020 1108   HDL 97 02/16/2018 0801   CHOLHDL 2 12/07/2020 1108   VLDL 15.2 12/07/2020 1108   LDLCALC 68 12/07/2020 1108   LDLCALC 56 02/16/2018 0801   LDLDIRECT 124.7 07/20/2012 0958      Wt Readings from Last 3 Encounters:  02/02/23 97 lb (44 kg)  08/19/22 96 lb 12.8 oz (43.9 kg)  05/14/22 97 lb (44 kg)      ASSESSMENT AND PLAN:  1  CAD Pt with remote intervention   Myoview in 2021   showed no ischemia   Follow   2.  HTN  BP is high  On my check it was 145/65  Follow at home    Call/ Mychart if consistent 140s and above   If occasional, can take an additional 1/2 or 1 of amlodpine       3  HL Check lipomed and A1C today   Increase activity       F/U in  clinic in AUg 2025  Signed, Dietrich Pates, MD  02/02/2023 11:57 AM    [facility] [address] [place], [place], [place]  [address] Phone: [phone]; Fax: [phone]

## 2023-02-02 ENCOUNTER — Encounter: Payer: Self-pay | Admitting: Internal Medicine

## 2023-02-02 ENCOUNTER — Ambulatory Visit: Payer: PPO | Attending: Internal Medicine | Admitting: Internal Medicine

## 2023-02-02 VITALS — BP 158/72 | HR 78 | Ht <= 58 in | Wt 97.0 lb

## 2023-02-02 DIAGNOSIS — E785 Hyperlipidemia, unspecified: Secondary | ICD-10-CM | POA: Diagnosis not present

## 2023-02-02 DIAGNOSIS — I2510 Atherosclerotic heart disease of native coronary artery without angina pectoris: Secondary | ICD-10-CM | POA: Diagnosis not present

## 2023-02-02 DIAGNOSIS — Z131 Encounter for screening for diabetes mellitus: Secondary | ICD-10-CM

## 2023-02-02 NOTE — Patient Instructions (Signed)
Medication Instructions:   *If you need a refill on your cardiac medications before your next appointment, please call your pharmacy*   Lab Work: NMR, BMET, CBC, HGBA1C  If you have labs (blood work) drawn today and your tests are completely normal, you will receive your results only by: MyChart Message (if you have MyChart) OR A paper copy in the mail If you have any lab test that is abnormal or we need to change your treatment, we will call you to review the results.   Testing/Procedures:    Follow-Up: At [facility], you and your health needs are our priority.  As part of our continuing mission to provide you with exceptional heart care, we have created designated Provider Care Teams.  These Care Teams include your primary Cardiologist (physician) and Advanced Practice Providers (APPs -  Physician Assistants and Nurse Practitioners) who all work together to provide you with the care you need, when you need it.  We recommend signing up for the patient portal called "MyChart".  Sign up information is provided on this After Visit Summary.  MyChart is used to connect with patients for Virtual Visits (Telemedicine).  Patients are able to view lab/test results, encounter notes, upcoming appointments, etc.  Non-urgent messages can be sent to your provider as well.   To learn more about what you can do with MyChart, go to [email or web address].    Your next appointment:   9 month(s)  Provider:   Dietrich Pates, MD     Other Instructions

## 2023-02-03 LAB — NMR, LIPOPROFILE
Cholesterol, Total: 164 mg/dL (ref 100–199)
HDL Particle Number: 34.3 umol/L (ref >=30.5)
HDL-C: 80 mg/dL (ref >39)
LDL Particle Number: 594 nmol/L (ref <1000)
LDL Size: 21 nm (ref >20.5)
LDL-C (NIH Calc): 70 mg/dL (ref 0–99)
LP-IR Score: <25 (ref <=45)
Small LDL Particle Number: <90 nmol/L (ref <=527)
Triglycerides: 73 mg/dL (ref 0–149)

## 2023-02-03 LAB — CBC
Hematocrit: 36.4 % (ref 34.0–46.6)
Hemoglobin: 11.3 g/dL (ref 11.1–15.9)
MCH: 27.6 pg (ref 26.6–33.0)
MCHC: 31 g/dL — ABNORMAL LOW (ref 31.5–35.7)
MCV: 89 fL (ref 79–97)
Platelets: 403 10*3/uL (ref 150–450)
RBC: 4.1 x10E6/uL (ref 3.77–5.28)
RDW: 13.9 % (ref 11.7–15.4)
WBC: 8.4 10*3/uL (ref 3.4–10.8)

## 2023-02-03 LAB — BASIC METABOLIC PANEL
BUN/Creatinine Ratio: 15 (ref 12–28)
BUN: 15 mg/dL (ref 8–27)
CO2: 21 mmol/L (ref 20–29)
Calcium: 10.1 mg/dL (ref 8.7–10.3)
Chloride: 100 mmol/L (ref 96–106)
Creatinine, Ser: 0.98 mg/dL (ref 0.57–1.00)
Glucose: 87 mg/dL (ref 70–99)
Potassium: 4.7 mmol/L (ref 3.5–5.2)
Sodium: 140 mmol/L (ref 134–144)
eGFR: 56 mL/min/{1.73_m2} — ABNORMAL LOW (ref >59)

## 2023-02-03 LAB — HEMOGLOBIN A1C
Est. average glucose Bld gHb Est-mCnc: 131 mg/dL
Hgb A1c MFr Bld: 6.2 % — ABNORMAL HIGH (ref 4.8–5.6)

## 2023-02-24 ENCOUNTER — Encounter: Payer: PPO | Admitting: Family Medicine

## 2023-02-24 NOTE — Progress Notes (Deleted)
PATIENT CHECK-IN and HEALTH RISK ASSESSMENT QUESTIONNAIRE:  -completed by phone/video for upcoming Medicare Preventive Visit  Pre-Visit Check-in:  PLEASE CHECK FIRST UNDER ROOMING TAB, then QUESTIONNAIRES to see if patient completed online AWV questions. If so use those to complete some of these question ahead of time.   1)Vitals (height, wt, BP, etc) - record in vitals section for visit on day of visit Request home vitals (wt, BP, etc.) and enter into vitals, THEN update Vital Signs SmartPhrase below at the top of the HPI. See below.  2)Review and Update Medications, Allergies PMH, Surgeries, Social history in Epic 3)Hospitalizations in the last year with date/reason? ***  4)Review and Update Care Team (patient's specialists) in Epic 5) Complete PHQ9 in Epic  6) Complete Fall Screening in Epic 7)Review all Health Maintenance Due and order under PCP if not done.  8)Medicare Wellness Questionnaire: Answer theses question about your habits: Do you drink alcohol? *** If yes, how many drinks do you have a day?*** Have you ever smoked?*** Quit date if applicable? ***  How many packs a day do/did you smoke? *** Do you use smokeless tobacco?*** Do you use an illicit drugs?*** Do you exercises? ***IF so, what type and how many days/minutes per week?*** Are you sexually active? ***Number of partners?*** Typical breakfast**** Typical lunch*** Typical dinner*** Typical snacks:****  Beverages: ***  Answer theses question about you: Can you perform most household chores?*** Do you find it hard to follow a conversation in a noisy room?*** Do you often ask people to speak up or repeat themselves?*** Do you feel that you have a problem with memory?*** Do you balance your checkbook and or bank acounts?*** Do you feel safe at home?*** Last dentist visit?*** Do you need assistance with any of the following: Please note if so ***  Driving?  Feeding yourself?  Getting from bed to  chair?  Getting to the toilet?  Bathing or showering?  Dressing yourself?  Managing money?  Climbing a flight of stairs  Preparing meals?  Do you have Advanced Directives in place (Living Will, Healthcare Power or Attorney)? ***   Last eye Exam and location?***   Do you currently use prescribed or non-prescribed narcotic or opioid pain medications?***  Do you have a history or close family history of breast, ovarian, tubal or peritoneal cancer or a family member with BRCA (breast cancer susceptibility 1 and 2) gene mutations?  ***Request home vitals (wt, BP, etc.) and enter into vitals, THEN update Vital Signs SmartPhrase below at the top of the HPI. See below.   Nurse/Assistant Credentials/time stamp:   ----------------------------------------------------------------------------------------------------------------------------------------------------------------------------------------------------------------------  Because this visit was a virtual/telehealth visit, some criteria may be missing or patient reported. Any vitals not documented were not able to be obtained and vitals that have been documented are patient reported.    MEDICARE ANNUAL PREVENTIVE VISIT WITH PROVIDER: (Welcome to Medicare, initial annual wellness or annual wellness exam)  Virtual Visit via Video***Phone Note  I connected with Jennifer Donaldson on 02/24/23 by phone *** a video enabled telemedicine application and verified that I am speaking with the correct person using two identifiers.  Location patient: home Location provider:work or home office Persons participating in the virtual visit: patient, provider  Concerns and/or follow up today:   See HM section in Epic for other details of completed HM.    ROS: negative for report of fevers, unintentional weight loss, vision changes, vision loss, hearing loss or change, chest pain, sob, hemoptysis, melena, hematochezia, hematuria, falls, bleeding  or bruising, thoughts of suicide or self harm, memory loss  Patient-completed extensive health risk assessment - reviewed and discussed with the patient: See Health Risk Assessment completed with patient prior to the visit either above or in recent phone note. This was reviewed in detailed with the patient today and appropriate recommendations, orders and referrals were placed as needed per Summary below and patient instructions.   Review of Medical History: -PMH, PSH, Family History and current specialty and care providers reviewed and updated and listed below   Patient Care Team: Shirline Frees, NP as PCP - General (Family Medicine) Pricilla Riffle, MD as PCP - Cardiology (Cardiology) Lutricia Feil, [facility] (Pharmacist)   Past Medical History:  Diagnosis Date   C. difficile colitis    CAD (coronary artery disease)    Dyslipidemia 12/24/2006   Qualifier: Diagnosis of  By: Tawanna Cooler RN, Alvino Chapel     Hyperlipidemia    Mitral regurgitation    MITRAL REGURGITATION 08/12/2008   Qualifier: Diagnosis of  By: Kem Parkinson     Osteopenia    Osteoporosis 03/01/2014   Raynaud's disease    RAYNAUD'S DISEASE 12/24/2006   Qualifier: Diagnosis of  By: Tawanna Cooler RN, Alvino Chapel     Special screening for malignant neoplasm of colon     Past Surgical History:  Procedure Laterality Date   BREAST EXCISIONAL BIOPSY Left    CORONARY STENT PLACEMENT     2 prior drug eluting stent placed in the LAD and right coronary artery     Social History   Socioeconomic History   Marital status: Widowed    Spouse name: Not on file   Number of children: Not on file   Years of education: Not on file   Highest education level: 12th grade  Occupational History   Not on file  Tobacco Use   Smoking status: Former    Current packs/day: 0.00    Average packs/day: 0.5 packs/day for 20.0 years (10.0 ttl pk-yrs)    Types: Cigarettes    Start date: 09/01/1963    Quit date: 09/01/1983    Years since quitting: 39.5    Smokeless tobacco: Never  Vaping Use   Vaping status: Never Used  Substance and Sexual Activity   Alcohol use: No    Alcohol/week: 0.0 standard drinks of alcohol   Drug use: No   Sexual activity: Yes    Birth control/protection: None    Comment: Married  Other Topics Concern   Not on file  Social History Narrative   ** Merged History Encounter **       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 4 children Grew up in [place]   Social Determinants of Health   Financial Resource Strain: Low Risk  (05/10/2022)   Overall Financial Resource Strain (CARDIA)    Difficulty of Paying Living Expenses: Not hard at all  Food Insecurity: No Food Insecurity (05/10/2022)   Hunger Vital Sign    Worried About Running Out of Food in the Last Year: Never true    Ran Out of Food in the Last Year: Never true  Transportation Needs: No Transportation Needs (05/10/2022)   PRAPARE - Administrator, Civil Service (Medical): No    Lack of Transportation (Non-Medical): No  Physical Activity: Insufficiently Active (05/10/2022)   Exercise Vital Sign    Days of Exercise per Week: 2 days    Minutes of Exercise per Session: 30 min  Stress: Stress Concern Present (05/10/2022)   [employer] of Occupational Health - Occupational Stress Questionnaire    Feeling of Stress : To some extent  Social Connections: Moderately Isolated (05/10/2022)   Social Connection and Isolation Panel [NHANES]    Frequency of Communication with Friends and Family: More than three times a week    Frequency of Social Gatherings with Friends and Family: More than three times a week    Attends Religious Services: More than 4 times per year    Active Member of [employer] or Organizations: No    Attends Banker Meetings: Not on file    Marital Status: Widowed  Intimate Partner Violence: Not At Risk (02/18/2022)   Humiliation, Afraid, Rape, and  Kick questionnaire    Fear of Current or Ex-Partner: No    Emotionally Abused: No    Physically Abused: No    Sexually Abused: No    Family History  Problem Relation Age of Onset   CAD Brother    Stroke Father    CAD Mother    Breast cancer Neg Hx     Current Outpatient Medications on File Prior to Visit  Medication Sig Dispense Refill   alendronate (FOSAMAX) 70 MG tablet Take 70 mg by mouth once a week.  11   amLODipine (NORVASC) 2.5 MG tablet TAKE 1 TABLET BY MOUTH EVERY DAY 90 tablet 1   atorvastatin (LIPITOR) 20 MG tablet TAKE 1 TABLET BY MOUTH DAILY AT 6 PM. 90 tablet 2   calcium citrate-vitamin D (CITRACAL+D) 315-200 MG-UNIT per tablet Take 2 tablets by mouth daily.      Cholecalciferol (VITAMIN D3) 1000 units CAPS 1 tablet Orally Once a day     ezetimibe (ZETIA) 10 MG tablet Take 1 tablet (10 mg total) by mouth daily. 90 tablet 3   meloxicam (MOBIC) 15 MG tablet TAKE 1 (ONE) TABLET DAILY AS NEEDED FOR PAIN 90 tablet 0   metroNIDAZOLE (METROCREAM) 0.75 % cream Apply topically 2 (two) times daily.     omeprazole (PRILOSEC) 20 MG capsule TAKE 1 CAPSULE BY MOUTH EVERY DAY 90 capsule 3   No current facility-administered medications on file prior to visit.    Allergies  Allergen Reactions   Celexa [Citalopram]     Confusion     Codeine Sulfate     REACTION: sensitivity to       Physical Exam Vitals requested from patient and listed below if patient had equipment and was able to obtain at home for this virtual visit: There were no vitals filed for this visit. Estimated body mass index is 20.27 kg/m as calculated from the following:   Height as of 02/02/23: 4\' 10"  (1.473 m).   Weight as of 02/02/23: 97 lb (44 kg).  EKG (optional): deferred due to virtual visit  GENERAL: alert, oriented, no acute distress detected, full vision exam deferred due to pandemic and/or virtual encounter  *** HEENT: atraumatic, conjunttiva clear, no obvious abnormalities on inspection of  external nose and ears  NECK: normal movements of the head and neck  LUNGS: on inspection no signs of respiratory distress, breathing rate appears normal, no obvious gross SOB, gasping or wheezing  CV: no obvious cyanosis  MS: moves all visible extremities without noticeable abnormality  PSYCH/NEURO: pleasant and cooperative, no obvious depression or anxiety, speech and thought processing grossly intact, Cognitive function grossly intact  [employer] Visit from [facility]/04/2021 [facility] Total Score 9  02/18/2022    1:44 PM 12/04/2021    2:39 PM 04/25/2021    2:03 PM 02/14/2021    3:24 PM 02/13/2020   11:35 AM  Depression screen PHQ 2/9  Decreased Interest 0 1 2 0 0  Down, Depressed, Hopeless 0 3  0 0  PHQ - 2 Score 0 4 2 0 0  Altered sleeping  0 0  0  Tired, decreased energy  3 3  0  Change in appetite  0 2  0  Feeling bad or failure about yourself   0 0  0  Trouble concentrating  2 2  0  Moving slowly or fidgety/restless  0 2  0  Suicidal thoughts  0 0  0  PHQ-9 Score  9 11  0  Difficult doing work/chores  Not difficult at all   Not difficult at all       12/09/2019    1:42 PM 02/13/2020   11:33 AM 04/25/2021    1:23 PM 02/18/2022    1:47 PM 05/10/2022    8:55 AM  Fall Risk  Falls in the past year?  0 1 1 1   Was there an injury with Fall?  0 0 0 1  Was there an injury with Fall? - Comments    No injury or medical attention needed   Fall Risk Category Calculator  0 2 2 3   Fall Risk Category (Retired)  Low Moderate Moderate   (RETIRED) Patient Fall Risk Level Low fall risk Low fall risk Moderate fall risk Moderate fall risk   Patient at Risk for Falls Due to  No Fall Risks  Impaired balance/gait   Fall risk Follow up  Falls evaluation completed;Falls prevention discussed  Falls prevention discussed      SUMMARY AND PLAN:  No diagnosis found.  Visit coding *** [phone] (annual wellness visit -initial); G0439  (annual wellness subsequent); G0402 Welcome to Medicare(initial preventive physical exam)   Discussed applicable health maintenance/preventive health measures and advised and referred or ordered per patient preferences:  Health Maintenance  Topic Date Due   DTaP/Tdap/Td (3 - Tdap) 09/21/2018   INFLUENZA VACCINE  11/13/2022   COVID-19 Vaccine (3 - 2023-24 season) 12/14/2022   Medicare Annual Wellness (AWV)  02/19/2023   Pneumonia Vaccine 21+ Years old  Completed   DEXA SCAN  Completed   HPV VACCINES  Aged Out   Zoster Vaccines- Shingrix  Discontinued    Education and counseling on the following was provided based on the above review of health and a plan/checklist for the patient, along with additional information discussed, was provided for the patient in the patient instructions :  -Advised on importance of completing advanced directives, discussed options for completing and provided information in patient instructions as well -Provided counseling and plan for difficulty hearing  -Provided counseling and plan for increased risk of falling if applicable per above screening. Reviewed and demonstrated safe balance exercises that can be done at home to improve balance and discussed exercise guidelines for adults with include balance exercises at least 3 days per week.  -Advised and counseled on a healthy lifestyle - including the importance of a healthy diet, regular physical activity, social connections and stress management. -Reviewed patient's current diet. Advised and counseled on a whole foods based healthy diet. A summary of a healthy diet was provided in the Patient Instructions.  -reviewed patient's current physical activity level and discussed exercise guidelines for adults. Discussed community resources and ideas for safe exercise at home  to assist in meeting exercise guideline recommendations in a safe and healthy way.  -Advise yearly dental visits at minimum and regular eye  exams -Advised and counseled on alcohol safe limits, risks/ tobacco use, risks of smoking and offered counseling/help, drug, opoid use/misuse   Follow up: see patient instructions     There are no Patient Instructions on file for this visit.  Terressa Koyanagi, DO

## 2023-03-04 DIAGNOSIS — L2989 Other pruritus: Secondary | ICD-10-CM | POA: Diagnosis not present

## 2023-03-04 DIAGNOSIS — L309 Dermatitis, unspecified: Secondary | ICD-10-CM | POA: Diagnosis not present

## 2023-03-19 ENCOUNTER — Ambulatory Visit (INDEPENDENT_AMBULATORY_CARE_PROVIDER_SITE_OTHER): Payer: PPO | Admitting: Adult Health

## 2023-03-19 ENCOUNTER — Encounter: Payer: Self-pay | Admitting: Adult Health

## 2023-03-19 VITALS — BP 126/76 | HR 70 | Temp 98.2°F | Ht <= 58 in | Wt 97.8 lb

## 2023-03-19 DIAGNOSIS — L408 Other psoriasis: Secondary | ICD-10-CM | POA: Diagnosis not present

## 2023-03-19 DIAGNOSIS — R350 Frequency of micturition: Secondary | ICD-10-CM | POA: Insufficient documentation

## 2023-03-19 DIAGNOSIS — F339 Major depressive disorder, recurrent, unspecified: Secondary | ICD-10-CM | POA: Diagnosis not present

## 2023-03-19 LAB — POC URINALSYSI DIPSTICK (AUTOMATED)
Bilirubin, UA: NEGATIVE
Blood, UA: NEGATIVE
Glucose, UA: NEGATIVE
Ketones, UA: NEGATIVE
Leukocytes, UA: NEGATIVE
Nitrite, UA: POSITIVE
Protein, UA: POSITIVE — AB
Spec Grav, UA: 1.02 (ref 1.010–1.025)
Urobilinogen, UA: 0.2 U/dL
pH, UA: 5.5 (ref 5.0–8.0)

## 2023-03-19 MED ORDER — AMOXICILLIN-POT CLAVULANATE 875-125 MG PO TABS: 1.0000 | ORAL_TABLET | Freq: Two times a day (BID) | ORAL | 0 refills | Status: DC

## 2023-03-19 MED ORDER — BUPROPION HCL ER (XL) 150 MG PO TB24: 150.0000 mg | ORAL_TABLET | Freq: Every day | ORAL | 0 refills | Status: DC

## 2023-03-19 NOTE — Patient Instructions (Addendum)
I am going to start you on an antibiotic called Augmentin for the UTI   I am also going to start you on Wellbutrin to help with Depression   Please follow up in 30 days

## 2023-03-19 NOTE — Progress Notes (Signed)
Subjective:    Patient ID: Jennifer Donaldson, female    DOB: 1932-09-09, 89 y.o.   MRN: [id number]  Urinary Frequency  Associated symptoms include frequency.    89 year old female who  has a past medical history of C. difficile colitis, CAD (coronary artery disease), Dyslipidemia (12/24/2006), Hyperlipidemia, Mitral regurgitation, MITRAL REGURGITATION (08/12/2008), Osteopenia, Osteoporosis (03/01/2014), Raynaud's disease, RAYNAUD'S DISEASE (12/24/2006), and Special screening for malignant neoplasm of colon.  She presents to the office today for concern of a UTI. She reports that her symptoms include that of mild confusion, lower pelvic pressure, urinary frequency and urgency.   She denies dysuria or hematuria   Additionally, she reports feeling depressed. She does not want to get out of bed in the morning. Her depression stems from her husbands death two years ago and a recent death of a family member. She has been on Celexa and Lexapro in the past but did not like the way she feels on those medications.    Review of Systems  Genitourinary:  Positive for frequency.   See HPI   Past Medical History:  Diagnosis Date   C. difficile colitis    CAD (coronary artery disease)    Dyslipidemia 12/24/2006   Qualifier: Diagnosis of  By: Tawanna Cooler RN, Ellen     Hyperlipidemia    Mitral regurgitation    MITRAL REGURGITATION 08/12/2008   Qualifier: Diagnosis of  By: Kem Parkinson     Osteopenia    Osteoporosis 03/01/2014   Raynaud's disease    RAYNAUD'S DISEASE 12/24/2006   Qualifier: Diagnosis of  By: Tawanna Cooler RN, Alvino Chapel     Special screening for malignant neoplasm of colon     Social History   Socioeconomic History   Marital status: Widowed    Spouse name: Not on file   Number of children: Not on file   Years of education: Not on file   Highest education level: 12th grade  Occupational History   Not on file  Tobacco Use   Smoking status: Former    Current packs/day: 0.00    Average  packs/day: 0.5 packs/day for 20.0 years (10.0 ttl pk-yrs)    Types: Cigarettes    Start date: 09/01/1963    Quit date: 09/01/1983    Years since quitting: 39.5   Smokeless tobacco: Never  Vaping Use   Vaping status: Never Used  Substance and Sexual Activity   Alcohol use: No    Alcohol/week: 0.0 standard drinks of alcohol   Drug use: No   Sexual activity: Yes    Birth control/protection: None    Comment: Married  Other Topics Concern   Not on file  Social History Narrative   ** Merged History Encounter **       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 4 children Grew up in [place]   Social Determinants of Health   Financial Resource Strain: Low Risk  (05/10/2022)   Overall Financial Resource Strain (CARDIA)    Difficulty of Paying Living Expenses: Not hard at all  Food Insecurity: No Food Insecurity (05/10/2022)   Hunger Vital Sign    Worried About Running Out of Food in the Last Year: Never true    Ran Out of Food in the Last Year: Never true  Transportation Needs: No Transportation Needs (05/10/2022)   PRAPARE - Administrator, Civil Service (Medical): No    Lack of Transportation (Non-Medical): No  Physical Activity: Insufficiently Active (05/10/2022)   Exercise Vital Sign    Days of Exercise per Week: 2 days    Minutes of Exercise per Session: 30 min  Stress: Stress Concern Present (05/10/2022)   [employer] of Occupational Health - Occupational Stress Questionnaire    Feeling of Stress : To some extent  Social Connections: Moderately Isolated (05/10/2022)   Social Connection and Isolation Panel [NHANES]    Frequency of Communication with Friends and Family: More than three times a week    Frequency of Social Gatherings with Friends and Family: More than three times a week    Attends Religious Services: More than 4 times per year    Active Member of [employer] or Organizations: No     Attends Banker Meetings: Not on file    Marital Status: Widowed  Intimate Partner Violence: Not At Risk (02/18/2022)   Humiliation, Afraid, Rape, and Kick questionnaire    Fear of Current or Ex-Partner: No    Emotionally Abused: No    Physically Abused: No    Sexually Abused: No    Past Surgical History:  Procedure Laterality Date   BREAST EXCISIONAL BIOPSY Left    CORONARY STENT PLACEMENT     2 prior drug eluting stent placed in the LAD and right coronary artery     Family History  Problem Relation Age of Onset   CAD Brother    Stroke Father    CAD Mother    Breast cancer Neg Hx     Allergies  Allergen Reactions   Celexa [Citalopram]     Confusion     Codeine Sulfate     REACTION: sensitivity to    Current Outpatient Medications on File Prior to Visit  Medication Sig Dispense Refill   alendronate (FOSAMAX) 70 MG tablet Take 70 mg by mouth once a week.  11   amLODipine (NORVASC) 2.5 MG tablet TAKE 1 TABLET BY MOUTH EVERY DAY 90 tablet 1   atorvastatin (LIPITOR) 20 MG tablet TAKE 1 TABLET BY MOUTH DAILY AT 6 PM. 90 tablet 2   calcium citrate-vitamin D (CITRACAL+D) 315-200 MG-UNIT per tablet Take 2 tablets by mouth daily.      Cholecalciferol (VITAMIN D3) 1000 units CAPS 1 tablet Orally Once a day     ezetimibe (ZETIA) 10 MG tablet Take 1 tablet (10 mg total) by mouth daily. 90 tablet 3   meloxicam (MOBIC) 15 MG tablet TAKE 1 (ONE) TABLET DAILY AS NEEDED FOR PAIN 90 tablet 0   metroNIDAZOLE (METROCREAM) 0.75 % cream Apply topically 2 (two) times daily.     omeprazole (PRILOSEC) 20 MG capsule TAKE 1 CAPSULE BY MOUTH EVERY DAY 90 capsule 3   No current facility-administered medications on file prior to visit.    BP (!) 154/70 (BP Location: Left Arm, Patient Position: Sitting, Cuff Size: Normal)   Pulse 70   Temp 98.2 F (36.8 C) (Oral)   Ht 4\' 10"  (1.473 m)   Wt 97 lb 12.8 oz (44.4 kg)   SpO2 96%   BMI 20.44 kg/m       Objective:   Physical  Exam Constitutional:      Appearance: Normal appearance.  Abdominal:     General: Abdomen is flat. Bowel sounds are normal.     Palpations: Abdomen is soft.     Tenderness: There is abdominal tenderness.  Skin:    General: Skin is warm and dry.  Neurological:     General: No focal deficit  present.     Mental Status: She is alert and oriented to person, place, and time.  Psychiatric:        Mood and Affect: Mood is depressed. Affect is tearful.        Speech: Speech normal.        Behavior: Behavior normal.        Thought Content: Thought content normal.        Cognition and Memory: Memory is impaired.        Judgment: Judgment normal.       Assessment & Plan:  1. Frequent urination - Slightly confused in the office today. Will start treatment for UTI  - POCT Urinalysis Dipstick (Automated) + Nit and Protein  - amoxicillin-clavulanate (AUGMENTIN) 875-125 MG tablet; Take 1 tablet by mouth 2 (two) times daily.  Dispense: 20 tablet; Refill: 0 - Urine Culture; Future  2. Depression, recurrent (HCC) - She does appear depressed in the office today. I will start her on Wellbutrin 150 mg ER daily.  - Follow up in 30 days or sooner if needed  - buPROPion (WELLBUTRIN XL) 150 MG 24 hr tablet; Take 1 tablet (150 mg total) by mouth daily.  Dispense: 90 tablet; Refill: 0  Shirline Frees, NP  Time spent with patient today was 32 minutes which consisted of chart review, discussing UTI and depression, work up, treatment answering questions and documentation.

## 2023-03-21 LAB — URINE CULTURE
MICRO NUMBER:: 15813699
SPECIMEN QUALITY:: ADEQUATE

## 2023-03-31 ENCOUNTER — Encounter: Payer: Self-pay | Admitting: Family Medicine

## 2023-03-31 ENCOUNTER — Ambulatory Visit (INDEPENDENT_AMBULATORY_CARE_PROVIDER_SITE_OTHER): Payer: PPO | Admitting: Family Medicine

## 2023-03-31 DIAGNOSIS — Z0000 Encounter for general adult medical examination without abnormal findings: Secondary | ICD-10-CM

## 2023-03-31 NOTE — Progress Notes (Signed)
PATIENT CHECK-IN and HEALTH RISK ASSESSMENT QUESTIONNAIRE:  -completed by phone/video for upcoming Medicare Preventive Visit   Pre-Visit Check-in: 1)Vitals (height, wt, BP, etc) - record in vitals section for visit on day of visit Request home vitals (wt, BP, etc.) and enter into vitals, THEN update Vital Signs SmartPhrase below at the top of the HPI. See below.  2)Review and Update Medications, Allergies PMH, Surgeries, Social history in Epic 3)Hospitalizations in the last year with date/reason? NO   4)Review and Update Care Team (patient's specialists) in Epic 5) Complete PHQ9 in Epic  6) Complete Fall Screening in Epic 7)Review all Health Maintenance Due and order under PCP if not done.  Medicare Wellness Patient Questionnaire:  Answer theses question about your habits: How often do you have a drink containing alcohol?NO  How many drinks containing alcohol do you have on a typical day when you are drinking?NA  How often do you have six or more drinks on one occasion?NA  Have you ever smoked?NO  Quit date if applicable? NA   How many packs a day do/did you smoke? NA  Do you use smokeless tobacco?NA  Do you use an illicit drugs?NA  On average, how many days per week do you engage in moderate to strenuous exercise (like a brisk walk)?NO  On average, how many minutes do you engage in exercise at this level?Na Are you sexually active? NO Number of partners?NA  Typical breakfast: waffle, toast, egg Typical lunch:Sandwich, soup,  Typical dinner:Soup  Typical snacks:cookies,   Beverages: Water, coffee, Tea   Answer theses question about your everyday activities: Can you perform most household chores?Yes Are you deaf or have significant trouble hearing?NO  Do you feel that you have a problem with memory?Yes - some since her husband passed, related to the grief Do you feel safe at home?Yes  Last dentist visit? A week ago  8. Do you have any difficulty performing your everyday  activities?NO  Are you having any difficulty walking, taking medications on your own, and or difficulty managing daily home needs?NO  Do you have difficulty walking or climbing stairs?NO  Do you have difficulty dressing or bathing?NO  Do you have difficulty doing errands alone such as visiting a doctor's office or shopping?Yes, daughter do this for her Do you currently have any difficulty preparing food and eating?NO  Do you currently have any difficulty using the toilet?NO  Do you have any difficulty managing your finances?Yes - daughters do this for her Do you have any difficulties with housekeeping of managing your housekeeping?Yes    Do you have Advanced Directives in place (Living Will, Healthcare Power or Attorney)? Yes    Last eye Exam and location?2 months ago, Dr. Angelica Pou    Do you currently use prescribed or non-prescribed narcotic or opioid pain medications?NO   Do you have a history or close family history of breast, ovarian, tubal or peritoneal cancer or a family member with BRCA (breast cancer susceptibility 1 and 2) gene mutations? NO    Nurse/Assistant Credentials/time stamp: Stann Ore CMA 3:35 pm     ----------------------------------------------------------------------------------------------------------------------------------------------------------------------------------------------------------------------  Because this visit was a virtual/telehealth visit, some criteria may be missing or patient reported. Any vitals not documented were not able to be obtained and vitals that have been documented are patient reported.    MEDICARE ANNUAL PREVENTIVE VISIT WITH PROVIDER: (Welcome to Medicare, initial annual wellness or annual wellness exam)  Virtual Visit via Video Note  I connected with Carolynn Comment on 03/31/23 by  a video enabled telemedicine application and verified that I am speaking with the correct person using two identifiers.  Location  patient: home Location provider:work or home office Persons participating in the virtual visit: patient, provider  Concerns and/or follow up today: stable, urinary symptoms resolved.    See HM section in Epic for other details of completed HM.    ROS: negative for report of fevers, unintentional weight loss, vision changes, vision loss, hearing loss or change, chest pain, sob, hemoptysis, melena, hematochezia, hematuria, falls, bleeding or bruising, thoughts of suicide or self harm, memory loss  Patient-completed extensive health risk assessment - reviewed and discussed with the patient: See Health Risk Assessment completed with patient prior to the visit either above or in recent phone note. This was reviewed in detailed with the patient today and appropriate recommendations, orders and referrals were placed as needed per Summary below and patient instructions.   Review of Medical History: -PMH, PSH, Family History and current specialty and care providers reviewed and updated and listed below   Patient Care Team: Shirline Frees, NP as PCP - General (Family Medicine) Pricilla Riffle, MD as PCP - Cardiology (Cardiology) Lutricia Feil, [facility] (Pharmacist)   Past Medical History:  Diagnosis Date   C. difficile colitis    CAD (coronary artery disease)    Dyslipidemia 12/24/2006   Qualifier: Diagnosis of  By: Tawanna Cooler RN, Alvino Chapel     Hyperlipidemia    Mitral regurgitation    MITRAL REGURGITATION 08/12/2008   Qualifier: Diagnosis of  By: Kem Parkinson     Osteopenia    Osteoporosis 03/01/2014   Raynaud's disease    RAYNAUD'S DISEASE 12/24/2006   Qualifier: Diagnosis of  By: Tawanna Cooler RN, Alvino Chapel     Special screening for malignant neoplasm of colon     Past Surgical History:  Procedure Laterality Date   BREAST EXCISIONAL BIOPSY Left    CORONARY STENT PLACEMENT     2 prior drug eluting stent placed in the LAD and right coronary artery     Social History   Socioeconomic History    Marital status: Widowed    Spouse name: Not on file   Number of children: Not on file   Years of education: Not on file   Highest education level: 12th grade  Occupational History   Not on file  Tobacco Use   Smoking status: Former    Current packs/day: 0.00    Average packs/day: 0.5 packs/day for 20.0 years (10.0 ttl pk-yrs)    Types: Cigarettes    Start date: 09/01/1963    Quit date: 09/01/1983    Years since quitting: 39.6   Smokeless tobacco: Never  Vaping Use   Vaping status: Never Used  Substance and Sexual Activity   Alcohol use: No    Alcohol/week: 0.0 standard drinks of alcohol   Drug use: No   Sexual activity: Yes    Birth control/protection: None    Comment: Married  Other Topics Concern   Not on file  Social History Narrative   ** Merged History Encounter **       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 4 children Grew up in [place]   Social Drivers of Health   Financial Resource Strain: Low Risk  (05/10/2022)   Overall Financial Resource Strain (CARDIA)    Difficulty of Paying Living Expenses: Not hard at all  Food Insecurity: No Food Insecurity (05/10/2022)   Hunger Vital  Sign    Worried About Programme researcher, broadcasting/film/video in the Last Year: Never true    Ran Out of Food in the Last Year: Never true  Transportation Needs: No Transportation Needs (05/10/2022)   PRAPARE - Administrator, Civil Service (Medical): No    Lack of Transportation (Non-Medical): No  Physical Activity: Insufficiently Active (05/10/2022)   Exercise Vital Sign    Days of Exercise per Week: 2 days    Minutes of Exercise per Session: 30 min  Stress: Stress Concern Present (05/10/2022)   [employer] of Occupational Health - Occupational Stress Questionnaire    Feeling of Stress : To some extent  Social Connections: Moderately Isolated (05/10/2022)   Social Connection and Isolation Panel [NHANES]    Frequency  of Communication with Friends and Family: More than three times a week    Frequency of Social Gatherings with Friends and Family: More than three times a week    Attends Religious Services: More than 4 times per year    Active Member of [employer] or Organizations: No    Attends Banker Meetings: Not on file    Marital Status: Widowed  Intimate Partner Violence: Not At Risk (02/18/2022)   Humiliation, Afraid, Rape, and Kick questionnaire    Fear of Current or Ex-Partner: No    Emotionally Abused: No    Physically Abused: No    Sexually Abused: No    Family History  Problem Relation Age of Onset   CAD Brother    Stroke Father    CAD Mother    Breast cancer Neg Hx     Current Outpatient Medications on File Prior to Visit  Medication Sig Dispense Refill   alendronate (FOSAMAX) 70 MG tablet Take 70 mg by mouth once a week.  11   amLODipine (NORVASC) 2.5 MG tablet TAKE 1 TABLET BY MOUTH EVERY DAY 90 tablet 1   atorvastatin (LIPITOR) 20 MG tablet TAKE 1 TABLET BY MOUTH DAILY AT 6 PM. 90 tablet 2   calcium citrate-vitamin D (CITRACAL+D) 315-200 MG-UNIT per tablet Take 2 tablets by mouth daily.      ezetimibe (ZETIA) 10 MG tablet Take 1 tablet (10 mg total) by mouth daily. 90 tablet 3   levocetirizine (XYZAL) 5 MG tablet Take 5 mg by mouth at bedtime.     meloxicam (MOBIC) 15 MG tablet TAKE 1 (ONE) TABLET DAILY AS NEEDED FOR PAIN 90 tablet 0   metroNIDAZOLE (METROCREAM) 0.75 % cream Apply topically 2 (two) times daily.     omeprazole (PRILOSEC) 20 MG capsule TAKE 1 CAPSULE BY MOUTH EVERY DAY 90 capsule 3   buPROPion (WELLBUTRIN XL) 150 MG 24 hr tablet Take 1 tablet (150 mg total) by mouth daily. (Patient not taking: Reported on 03/31/2023) 90 tablet 0   No current facility-administered medications on file prior to visit.    Allergies  Allergen Reactions   Celexa [Citalopram]     Confusion     Codeine Sulfate     REACTION: sensitivity to       Physical Exam Vitals  requested from patient and listed below if patient had equipment and was able to obtain at home for this virtual visit: There were no vitals filed for this visit. Estimated body mass index is 20.44 kg/m as calculated from the following:   Height as of 03/19/23: 4\' 10"  (1.473 m).   Weight as of 03/19/23: 97 lb 12.8 oz (44.4 kg).  EKG (optional): deferred due to virtual  visit  GENERAL: alert, oriented, no acute distress detected, full vision exam deferred due to pandemic and/or virtual encounter  HEENT: atraumatic, conjunttiva clear, no obvious abnormalities on inspection of external nose and ears  NECK: normal movements of the head and neck  LUNGS: on inspection no signs of respiratory distress, breathing rate appears normal, no obvious gross SOB, gasping or wheezing  CV: no obvious cyanosis  MS: moves all visible extremities without noticeable abnormality  PSYCH/NEURO: pleasant and cooperative, no obvious depression or anxiety, speech and thought processing grossly intact, Cognitive function grossly intact  Flowsheet Row Office Visit from 12/04/2021 in [facility] [place]  PHQ-9 Total Score 9           03/31/2023    3:25 PM 02/18/2022    1:44 PM 12/04/2021    2:39 PM 04/25/2021    2:03 PM 02/14/2021    3:24 PM  Depression screen PHQ 2/9  Decreased Interest 0 0 1 2 0  Down, Depressed, Hopeless 0 0 3  0  PHQ - 2 Score 0 0 4 2 0  Altered sleeping   0 0   Tired, decreased energy   3 3   Change in appetite   0 2   Feeling bad or failure about yourself    0 0   Trouble concentrating   2 2   Moving slowly or fidgety/restless   0 2   Suicidal thoughts   0 0   PHQ-9 Score   9 11   Difficult doing work/chores   Not difficult at all         02/13/2020   11:33 AM 04/25/2021    1:23 PM 02/18/2022    1:47 PM 05/10/2022    8:55 AM 03/31/2023    3:24 PM  Fall Risk  Falls in the past year? 0 1 1 1   0  Was there an injury with Fall? 0 0 0 1  0  Was there an  injury with Fall? - Comments   No injury or medical attention needed    Fall Risk Category Calculator 0 2 2 3 1   Fall Risk Category (Retired) Low Moderate Moderate    (RETIRED) Patient Fall Risk Level Low fall risk Moderate fall risk Moderate fall risk    Patient at Risk for Falls Due to No Fall Risks  Impaired balance/gait    Fall risk Follow up Falls evaluation completed;Falls prevention discussed  Falls prevention discussed  Falls evaluation completed     Patient-reported    SUMMARY AND PLAN:  Encounter for Medicare annual wellness exam  Discussed applicable health maintenance/preventive health measures and advised and referred or ordered per patient preferences: -they plan to get the vaccines at the pharmacy, advised to let us know when they do so that we can update the record  Health Maintenance  Topic Date Due   COVID-19 Vaccine (3 - 2024-25 season) 04/16/2023 (Originally 12/14/2022)   INFLUENZA VACCINE  07/13/2023 (Originally 11/13/2022)   DTaP/Tdap/Td (3 - Tdap) 03/30/2024 (Originally 09/21/2018)   Medicare Annual Wellness (AWV)  03/30/2024   Pneumonia Vaccine 17+ Years old  Completed   DEXA SCAN  Completed   HPV VACCINES  Aged Out   Zoster Vaccines- Shingrix  Discontinued      Education and counseling on the following was provided based on the above review of health and a plan/checklist for the patient, along with additional information discussed, was provided for the patient in the patient instructions :   -  Provided counseling and plan for increased risk of falling if applicable per above screening. Reviewed and discussed safe balance exercises that can be done at home to improve balance and discussed exercise guidelines for adults with include balance exercises at least 3 days per week.  -Advised and counseled on a healthy lifestyle - including the importance of a healthy diet, regular physical activity, social connections and stress management. -Reviewed patient's current  diet. Advised and counseled on a whole foods based healthy diet. A summary of a healthy diet was provided in the Patient Instructions.  -reviewed patient's current physical activity level and discussed exercise guidelines for adults. Discussed community resources and ideas for safe exercise at home to assist in meeting exercise guideline recommendations in a safe and healthy way.  -Advise yearly dental visits at minimum and regular eye exams   Follow up: see patient instructions     Patient Instructions  I really enjoyed getting to talk with you today! I am available on Tuesdays and Thursdays for virtual visits if you have any questions or concerns, or if I can be of any further assistance.   CHECKLIST FROM ANNUAL WELLNESS VISIT:  -Follow up (please call to schedule if not scheduled after visit):   -yearly for annual wellness visit with primary care office  Here is a list of your preventive care/health maintenance measures and the plan for each if any are due:  PLAN For any measures below that may be due:  -can get the update flu and covid and the tetanus booster at the pharmacy. Please notify Kandee Keen when you do so that we can update your record.   Health Maintenance  Topic Date Due   COVID-19 Vaccine (3 - 2024-25 season) 04/16/2023 (Originally 12/14/2022)   INFLUENZA VACCINE  07/13/2023 (Originally 11/13/2022)   DTaP/Tdap/Td (3 - Tdap) 03/30/2024 (Originally 09/21/2018)   Medicare Annual Wellness (AWV)  03/30/2024   Pneumonia Vaccine 36+ Years old  Completed   DEXA SCAN  Completed   HPV VACCINES  Aged Out   Zoster Vaccines- Shingrix  Discontinued    -See a dentist at least yearly  -Get your eyes checked and then per your eye specialist's recommendations  -Other issues addressed today:   -I have included below further information regarding a healthy whole foods based diet, physical activity guidelines for adults, stress management and opportunities for social connections. I hope you  find this information useful.   -----------------------------------------------------------------------------------------------------------------------------------------------------------------------------------------------------------------------------------------------------------  NUTRITION: -eat real food: lots of colorful vegetables (half the plate) and fruits -5-7 servings of vegetables and fruits per day (fresh or steamed is best), exp. 2 servings of vegetables with lunch and dinner and 2 servings of fruit per day. Berries and greens such as kale and collards are great choices.  -consume on a regular basis: whole grains (make sure first ingredient on label contains the word "whole"), fresh fruits, fish, nuts, seeds, healthy oils (such as olive oil, avocado oil, grape seed oil) -may eat small amounts of dairy and lean meat on occasion, but avoid processed meats such as ham, bacon, lunch meat, etc. -drink water -try to avoid fast food and pre-packaged foods, processed meat -most experts advise limiting sodium to < 2300mg  per day, should limit further is any chronic conditions such as high blood pressure, heart disease, diabetes, etc. The American Heart Association advised that < 1500mg  is is ideal -try to avoid foods that contain any ingredients with names you do not recognize  -try to avoid sugar/sweets (except for the natural sugar  that occurs in fresh fruit) -try to avoid sweet drinks -try to avoid white rice, white bread, pasta (unless whole grain), white or yellow potatoes  EXERCISE GUIDELINES FOR ADULTS: -if you wish to increase your physical activity, do so gradually and with the approval of your doctor -STOP and seek medical care immediately if you have any chest pain, chest discomfort or trouble breathing when starting or increasing exercise  -move and stretch your body, legs, feet and arms when sitting for long periods -Physical activity guidelines for optimal health in  adults: -least 150 minutes per week of aerobic exercise (can talk, but not sing) once approved by your doctor, 20-30 minutes of sustained activity or two 10 minute episodes of sustained activity every day.  -resistance training at least 2 days per week if approved by your doctor -balance exercises 3+ days per week:   Stand somewhere where you have something sturdy to hold onto if you lose balance.    1) lift up on toes, start with 5x per day and work up to 20x   2) stand and lift on leg straight out to the side so that foot is a few inches of the floor, start with 5x each side and work up to 20x each side   3) stand on one foot, start with 5 seconds each side and work up to 20 seconds on each side  If you need ideas or help with getting more active:  -Silver sneakers https://tools.silversneakers.com  -Walk with a Doc: http://www.duncan-williams.com/  -try to include resistance (weight lifting/strength building) and balance exercises twice per week: or the following link for ideas: http://castillo-powell.com/  BuyDucts.dk  STRESS MANAGEMENT: -can try meditating, or just sitting quietly with deep breathing while intentionally relaxing all parts of your body for 5 minutes daily -if you need further help with stress, anxiety or depression please follow up with your primary doctor or contact the wonderful folks at [facility]: [phone]  SOCIAL CONNECTIONS: -options in [place] if you wish to engage in more social and exercise related activities:  -Silver sneakers https://tools.silversneakers.com  -Walk with a Doc: http://www.duncan-williams.com/  -Check out the [facility] Active Adults 50+ section on the [employer] (hiking clubs, book clubs, cards and games, chess, exercise classes, aquatic classes and much more) - see the website for  details: https://www.[place].gov/departments/parks-recreation/active-adults50  -YouTube has lots of exercise videos for different ages and abilities as well  -[facility] (a variety of indoor and outdoor inperson activities for adults). (502)467-1934. [address].  -Virtual Online Classes (a variety of topics): see seniorplanet.org or call (207)186-7112  -consider volunteering at a school, hospice center, church, senior center or elsewhere           Terressa Koyanagi, DO

## 2023-03-31 NOTE — Progress Notes (Signed)
"  Patient was unable to self-report due to a lack of equipment at home via telehealth"

## 2023-03-31 NOTE — Patient Instructions (Signed)
I really enjoyed getting to talk with you today! I am available on Tuesdays and Thursdays for virtual visits if you have any questions or concerns, or if I can be of any further assistance.   CHECKLIST FROM ANNUAL WELLNESS VISIT:  -Follow up (please call to schedule if not scheduled after visit):   -yearly for annual wellness visit with primary care office  Here is a list of your preventive care/health maintenance measures and the plan for each if any are due:  PLAN For any measures below that may be due:  -can get the update flu and covid and the tetanus booster at the pharmacy. Please notify Jennifer Donaldson when you do so that we can update your record.   Health Maintenance  Topic Date Due   COVID-19 Vaccine (3 - 2024-25 season) 04/16/2023 (Originally 12/14/2022)   INFLUENZA VACCINE  07/13/2023 (Originally 11/13/2022)   DTaP/Tdap/Td (3 - Tdap) 03/30/2024 (Originally 09/21/2018)   Medicare Annual Wellness (AWV)  03/30/2024   Pneumonia Vaccine 56+ Years old  Completed   DEXA SCAN  Completed   HPV VACCINES  Aged Out   Zoster Vaccines- Shingrix  Discontinued    -See a dentist at least yearly  -Get your eyes checked and then per your eye specialist's recommendations  -Other issues addressed today:   -I have included below further information regarding a healthy whole foods based diet, physical activity guidelines for adults, stress management and opportunities for social connections. I hope you find this information useful.   -----------------------------------------------------------------------------------------------------------------------------------------------------------------------------------------------------------------------------------------------------------  NUTRITION: -eat real food: lots of colorful vegetables (half the plate) and fruits -5-7 servings of vegetables and fruits per day (fresh or steamed is best), exp. 2 servings of vegetables with lunch and dinner and 2 servings of  fruit per day. Berries and greens such as kale and collards are great choices.  -consume on a regular basis: whole grains (make sure first ingredient on label contains the word "whole"), fresh fruits, fish, nuts, seeds, healthy oils (such as olive oil, avocado oil, grape seed oil) -may eat small amounts of dairy and lean meat on occasion, but avoid processed meats such as ham, bacon, lunch meat, etc. -drink water -try to avoid fast food and pre-packaged foods, processed meat -most experts advise limiting sodium to < 2300mg  per day, should limit further is any chronic conditions such as high blood pressure, heart disease, diabetes, etc. The American Heart Association advised that < 1500mg  is is ideal -try to avoid foods that contain any ingredients with names you do not recognize  -try to avoid sugar/sweets (except for the natural sugar that occurs in fresh fruit) -try to avoid sweet drinks -try to avoid white rice, white bread, pasta (unless whole grain), white or yellow potatoes  EXERCISE GUIDELINES FOR ADULTS: -if you wish to increase your physical activity, do so gradually and with the approval of your doctor -STOP and seek medical care immediately if you have any chest pain, chest discomfort or trouble breathing when starting or increasing exercise  -move and stretch your body, legs, feet and arms when sitting for long periods -Physical activity guidelines for optimal health in adults: -least 150 minutes per week of aerobic exercise (can talk, but not sing) once approved by your doctor, 20-30 minutes of sustained activity or two 10 minute episodes of sustained activity every day.  -resistance training at least 2 days per week if approved by your doctor -balance exercises 3+ days per week:   Stand somewhere where you have something sturdy to hold  onto if you lose balance.    1) lift up on toes, start with 5x per day and work up to 20x   2) stand and lift on leg straight out to the side so  that foot is a few inches of the floor, start with 5x each side and work up to 20x each side   3) stand on one foot, start with 5 seconds each side and work up to 20 seconds on each side  If you need ideas or help with getting more active:  -Silver sneakers https://tools.silversneakers.com  -Walk with a Doc: http://www.duncan-williams.com/  -try to include resistance (weight lifting/strength building) and balance exercises twice per week: or the following link for ideas: http://castillo-powell.com/  BuyDucts.dk  STRESS MANAGEMENT: -can try meditating, or just sitting quietly with deep breathing while intentionally relaxing all parts of your body for 5 minutes daily -if you need further help with stress, anxiety or depression please follow up with your primary doctor or contact the wonderful folks at [facility]: [phone]  SOCIAL CONNECTIONS: -options in [place] if you wish to engage in more social and exercise related activities:  -Silver sneakers https://tools.silversneakers.com  -Walk with a Doc: http://www.duncan-williams.com/  -Check out the [facility] Active Adults 50+ section on the [place] of [employer] (hiking clubs, book clubs, cards and games, chess, exercise classes, aquatic classes and much more) - see the website for details: https://www.[place].gov/departments/parks-recreation/active-adults50  -YouTube has lots of exercise videos for different ages and abilities as well  -[facility] (a variety of indoor and outdoor inperson activities for adults). [phone]. [address]  -Virtual Online Classes (a variety of topics): see seniorplanet.org or call [phone]  -consider volunteering at a school, hospice center, church, senior center or elsewhere

## 2023-04-03 ENCOUNTER — Other Ambulatory Visit: Payer: Self-pay | Admitting: Adult Health

## 2023-04-03 DIAGNOSIS — M150 Primary generalized (osteo)arthritis: Secondary | ICD-10-CM

## 2023-04-12 ENCOUNTER — Other Ambulatory Visit: Payer: Self-pay | Admitting: Adult Health

## 2023-04-12 DIAGNOSIS — K2100 Gastro-esophageal reflux disease with esophagitis, without bleeding: Secondary | ICD-10-CM

## 2023-04-22 ENCOUNTER — Ambulatory Visit: Payer: PPO | Admitting: Adult Health

## 2023-04-22 VITALS — BP 140/78 | HR 85 | Temp 98.0°F | Ht <= 58 in | Wt 91.0 lb

## 2023-04-22 DIAGNOSIS — F339 Major depressive disorder, recurrent, unspecified: Secondary | ICD-10-CM

## 2023-04-22 NOTE — Progress Notes (Signed)
 Subjective:    Patient ID: Jennifer Donaldson, female    DOB: [birth year], 89 y.o.   MRN: [id number]  HPI  89 year old female who  has a past medical history of C. difficile colitis, CAD (coronary artery disease), Dyslipidemia (12/24/2006), Hyperlipidemia, Mitral regurgitation, MITRAL REGURGITATION (08/12/2008), Osteopenia, Osteoporosis (03/01/2014), Raynaud's disease, RAYNAUD'S DISEASE (12/24/2006), and Special screening for malignant neoplasm of colon.  She presents to the office today for one month follow up regarding Depression. When she was last seen she was prescribed Wellbutrin  150 mg XR daily.  At the same time she was also noted to have a UTI and was treated with Augmentin .  She did not start on the Wellbutrin  she was not sure if the depression was stemming from the UTI.  She finished her antibiotics and symptoms of her UTI and confusion resolved but she continues to feel depressed and lonely after her husband's death.     Review of Systems See HPI   Past Medical History:  Diagnosis Date   C. difficile colitis    CAD (coronary artery disease)    Dyslipidemia 12/24/2006   Qualifier: Diagnosis of  By: Krystal RN, Ellen     Hyperlipidemia    Mitral regurgitation    MITRAL REGURGITATION 08/12/2008   Qualifier: Diagnosis of  By: Gwenn Grimes     Osteopenia    Osteoporosis 03/01/2014   Raynaud's disease    RAYNAUD'S DISEASE 12/24/2006   Qualifier: Diagnosis of  By: Krystal RN, Leeroy     Special screening for malignant neoplasm of colon     Social History   Socioeconomic History   Marital status: Widowed    Spouse name: Not on file   Number of children: Not on file   Years of education: Not on file   Highest education level: 12th grade  Occupational History   Not on file  Tobacco Use   Smoking status: Former    Current packs/day: 0.00    Average packs/day: 0.5 packs/day for 20.0 years (10.0 ttl pk-yrs)    Types: Cigarettes    Start date: 09/01/1963    Quit date:  09/01/1983    Years since quitting: 39.6   Smokeless tobacco: Never  Vaping Use   Vaping status: Never Used  Substance and Sexual Activity   Alcohol use: No    Alcohol/week: 0.0 standard drinks of alcohol   Drug use: No   Sexual activity: Yes    Birth control/protection: None    Comment: Married  Other Topics Concern   Not on file  Social History Narrative   ** Merged History Encounter **       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 4 children Grew up in [place]   Social Drivers of Health   Financial Resource Strain: Low Risk  (05/10/2022)   Overall Financial Resource Strain (CARDIA)    Difficulty of Paying Living Expenses: Not hard at all  Food Insecurity: No Food Insecurity (05/10/2022)   Hunger Vital Sign    Worried About Running Out of Food in the Last Year: Never true    Ran Out of Food in the Last Year: Never true  Transportation Needs: No Transportation Needs (05/10/2022)   PRAPARE - Administrator, Civil Service (Medical): No    Lack of Transportation (Non-Medical): No  Physical Activity: Insufficiently Active (05/10/2022)   Exercise Vital Sign    Days of Exercise per Week: 2  days    Minutes of Exercise per Session: 30 min  Stress: Stress Concern Present (05/10/2022)   [employer] of Occupational Health - Occupational Stress Questionnaire    Feeling of Stress : To some extent  Social Connections: Moderately Isolated (05/10/2022)   Social Connection and Isolation Panel [NHANES]    Frequency of Communication with Friends and Family: More than three times a week    Frequency of Social Gatherings with Friends and Family: More than three times a week    Attends Religious Services: More than 4 times per year    Active Member of [employer] or Organizations: No    Attends Banker Meetings: Not on file    Marital Status: Widowed  Intimate Partner Violence: Not At Risk  (02/18/2022)   Humiliation, Afraid, Rape, and Kick questionnaire    Fear of Current or Ex-Partner: No    Emotionally Abused: No    Physically Abused: No    Sexually Abused: No    Past Surgical History:  Procedure Laterality Date   BREAST EXCISIONAL BIOPSY Left    CORONARY STENT PLACEMENT     2 prior drug eluting stent placed in the LAD and right coronary artery     Family History  Problem Relation Age of Onset   CAD Brother    Stroke Father    CAD Mother    Breast cancer Neg Hx     Allergies  Allergen Reactions   Celexa  [Citalopram ]     Confusion     Codeine Sulfate     REACTION: sensitivity to    Current Outpatient Medications on File Prior to Visit  Medication Sig Dispense Refill   alendronate (FOSAMAX) 70 MG tablet Take 70 mg by mouth once a week.  11   amLODipine  (NORVASC ) 2.5 MG tablet TAKE 1 TABLET BY MOUTH EVERY DAY 90 tablet 1   atorvastatin  (LIPITOR) 20 MG tablet TAKE 1 TABLET BY MOUTH DAILY AT 6 PM. 90 tablet 2   buPROPion  (WELLBUTRIN  XL) 150 MG 24 hr tablet Take 1 tablet (150 mg total) by mouth daily. 90 tablet 0   calcium  citrate-vitamin D  (CITRACAL+D) 315-200 MG-UNIT per tablet Take 2 tablets by mouth daily.      ezetimibe  (ZETIA ) 10 MG tablet Take 1 tablet (10 mg total) by mouth daily. 90 tablet 3   levocetirizine (XYZAL) 5 MG tablet Take 5 mg by mouth at bedtime.     meloxicam  (MOBIC ) 15 MG tablet TAKE 1 (ONE) TABLET DAILY AS NEEDED FOR PAIN 90 tablet 0   metroNIDAZOLE  (METROCREAM ) 0.75 % cream Apply topically 2 (two) times daily.     omeprazole  (PRILOSEC) 20 MG capsule TAKE 1 CAPSULE BY MOUTH EVERY DAY 90 capsule 3   No current facility-administered medications on file prior to visit.    BP (!) 140/78   Pulse 85   Temp 98 F (36.7 C) (Oral)   Ht 4' 10 (1.473 m)   Wt 91 lb (41.3 kg)   SpO2 99%   BMI 19.02 kg/m       Objective:   Physical Exam Vitals and nursing note reviewed.  Constitutional:      Appearance: Normal appearance.   Cardiovascular:     Rate and Rhythm: Normal rate and regular rhythm.     Pulses: Normal pulses.     Heart sounds: Normal heart sounds.  Pulmonary:     Effort: Pulmonary effort is normal.     Breath sounds: Normal breath sounds.  Skin:  General: Skin is warm.  Neurological:     General: No focal deficit present.     Mental Status: She is alert and oriented to person, place, and time.  Psychiatric:        Mood and Affect: Mood is depressed. Affect is tearful.        Behavior: Behavior normal.        Thought Content: Thought content normal.        Judgment: Judgment normal.        Assessment & Plan:  1. Depression, recurrent (HCC) (Primary) -She is open to trying Wellbutrin  for 30 days to see if this helps with her's loneliness and depression.  Will have her follow-up at that point in time.  We can consider referral to psychiatry if needed  Darleene Shape, NP  Time spent with patient today was 32 minutes which consisted of chart review, discussing depression, work up, treatment answering questions and documentation.

## 2023-04-25 ENCOUNTER — Other Ambulatory Visit: Payer: Self-pay | Admitting: Internal Medicine

## 2023-04-28 ENCOUNTER — Other Ambulatory Visit: Payer: Self-pay | Admitting: Adult Health

## 2023-04-28 DIAGNOSIS — M150 Primary generalized (osteo)arthritis: Secondary | ICD-10-CM

## 2023-05-22 ENCOUNTER — Ambulatory Visit: Payer: PPO | Admitting: Adult Health

## 2023-06-03 ENCOUNTER — Other Ambulatory Visit: Payer: Self-pay | Admitting: Adult Health

## 2023-06-14 ENCOUNTER — Other Ambulatory Visit: Payer: Self-pay | Admitting: Adult Health

## 2023-06-14 DIAGNOSIS — F339 Major depressive disorder, recurrent, unspecified: Secondary | ICD-10-CM

## 2023-06-16 ENCOUNTER — Ambulatory Visit (INDEPENDENT_AMBULATORY_CARE_PROVIDER_SITE_OTHER): Payer: PPO | Admitting: Adult Health

## 2023-06-16 ENCOUNTER — Encounter: Payer: Self-pay | Admitting: Adult Health

## 2023-06-16 VITALS — BP 132/82 | HR 86 | Temp 98.0°F | Ht <= 58 in | Wt 90.0 lb

## 2023-06-16 DIAGNOSIS — R4189 Other symptoms and signs involving cognitive functions and awareness: Secondary | ICD-10-CM | POA: Diagnosis not present

## 2023-06-16 DIAGNOSIS — F339 Major depressive disorder, recurrent, unspecified: Secondary | ICD-10-CM

## 2023-06-16 NOTE — Progress Notes (Signed)
 Subjective:    Patient ID: Jennifer Donaldson, female    DOB: [birth year], 89 y.o.   MRN: [id number]  HPI 89 year old female who  has a past medical history of C. difficile colitis, CAD (coronary artery disease), Dyslipidemia (12/24/2006), Hyperlipidemia, Mitral regurgitation, MITRAL REGURGITATION (08/12/2008), Osteopenia, Osteoporosis (03/01/2014), Raynaud's disease, RAYNAUD'S DISEASE (12/24/2006), and Special screening for malignant neoplasm of colon.  She presents to the office today for follow-up regarding depression.  She was last seen in January 2025 after she was prescribed Wellbutrin 150 mg extended release.  At this time she ended up not starting her Wellbutrin she was not sure if her depression was stemming from a UTI that she was being treated for.  She continued to have depression so I wanted her to give Wellbutrin trial.  Today she reports that she stopped taking Wellbutrin after two weeks as she did not like the way she felt while taking it. She does not feel like she is depressed any longer and is looking forward to spring.  Today she reports that she is concerned about her memory.  She is more concerned about short-term memory, she denies getting lost when in a car and her daughter who is with her today reports that the patient will often give her directions on how to get places.  She is not leaving her stove on or water running in the house.  She is able to carry on conversations but has trouble remembering words at times.   Review of Systems See HPI   Past Medical History:  Diagnosis Date   C. difficile colitis    CAD (coronary artery disease)    Dyslipidemia 12/24/2006   Qualifier: Diagnosis of  By: Tawanna Cooler RN, Ellen     Hyperlipidemia    Mitral regurgitation    MITRAL REGURGITATION 08/12/2008   Qualifier: Diagnosis of  By: Kem Parkinson     Osteopenia    Osteoporosis 03/01/2014   Raynaud's disease    RAYNAUD'S DISEASE 12/24/2006   Qualifier: Diagnosis of  By: Tawanna Cooler  RN, Alvino Chapel     Special screening for malignant neoplasm of colon     Social History   Socioeconomic History   Marital status: Widowed    Spouse name: Not on file   Number of children: Not on file   Years of education: Not on file   Highest education level: 12th grade  Occupational History   Not on file  Tobacco Use   Smoking status: Former    Current packs/day: 0.00    Average packs/day: 0.5 packs/day for 20.0 years (10.0 ttl pk-yrs)    Types: Cigarettes    Start date: 09/01/1963    Quit date: 09/01/1983    Years since quitting: 39.8   Smokeless tobacco: Never  Vaping Use   Vaping status: Never Used  Substance and Sexual Activity   Alcohol use: No    Alcohol/week: 0.0 standard drinks of alcohol   Drug use: No   Sexual activity: Yes    Birth control/protection: None    Comment: Married  Other Topics Concern   Not on file  Social History Narrative   ** Merged History Encounter **       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 4 children Grew up in [place]   Social Drivers of Health   Financial Resource Strain: Low Risk  (05/10/2022)   Overall Financial Resource Strain (CARDIA)    Difficulty  of Paying Living Expenses: Not hard at all  Food Insecurity: No Food Insecurity (05/10/2022)   Hunger Vital Sign    Worried About Running Out of Food in the Last Year: Never true    Ran Out of Food in the Last Year: Never true  Transportation Needs: No Transportation Needs (05/10/2022)   PRAPARE - Administrator, Civil Service (Medical): No    Lack of Transportation (Non-Medical): No  Physical Activity: Insufficiently Active (05/10/2022)   Exercise Vital Sign    Days of Exercise per Week: 2 days    Minutes of Exercise per Session: 30 min  Stress: Stress Concern Present (05/10/2022)   [employer] of Occupational Health - Occupational Stress Questionnaire    Feeling of Stress : To some extent   Social Connections: Moderately Isolated (05/10/2022)   Social Connection and Isolation Panel [NHANES]    Frequency of Communication with Friends and Family: More than three times a week    Frequency of Social Gatherings with Friends and Family: More than three times a week    Attends Religious Services: More than 4 times per year    Active Member of [employer] or Organizations: No    Attends Banker Meetings: Not on file    Marital Status: Widowed  Intimate Partner Violence: Not At Risk (02/18/2022)   Humiliation, Afraid, Rape, and Kick questionnaire    Fear of Current or Ex-Partner: No    Emotionally Abused: No    Physically Abused: No    Sexually Abused: No    Past Surgical History:  Procedure Laterality Date   BREAST EXCISIONAL BIOPSY Left    CORONARY STENT PLACEMENT     2 prior drug eluting stent placed in the LAD and right coronary artery     Family History  Problem Relation Age of Onset   CAD Brother    Stroke Father    CAD Mother    Breast cancer Neg Hx     Allergies  Allergen Reactions   Celexa [Citalopram]     Confusion     Codeine Sulfate     REACTION: sensitivity to    Current Outpatient Medications on File Prior to Visit  Medication Sig Dispense Refill   alendronate (FOSAMAX) 70 MG tablet Take 70 mg by mouth once a week.  11   amLODipine (NORVASC) 2.5 MG tablet TAKE 1 TABLET BY MOUTH EVERY DAY 90 tablet 1   atorvastatin (LIPITOR) 20 MG tablet TAKE 1 TABLET BY MOUTH DAILY AT 6 PM. 90 tablet 2   betamethasone dipropionate 0.05 % cream Apply topically.     buPROPion (WELLBUTRIN XL) 150 MG 24 hr tablet TAKE 1 TABLET BY MOUTH EVERY DAY 90 tablet 0   calcium citrate-vitamin D (CITRACAL+D) 315-200 MG-UNIT per tablet Take 2 tablets by mouth daily.      ezetimibe (ZETIA) 10 MG tablet TAKE 1 TABLET BY MOUTH EVERY DAY 90 tablet 3   levocetirizine (XYZAL) 5 MG tablet Take 5 mg by mouth at bedtime.     meloxicam (MOBIC) 15 MG tablet TAKE 1 (ONE) TABLET DAILY  AS NEEDED FOR PAIN 90 tablet 0   metroNIDAZOLE (METROCREAM) 0.75 % cream Apply topically 2 (two) times daily.     omeprazole (PRILOSEC) 20 MG capsule TAKE 1 CAPSULE BY MOUTH EVERY DAY 90 capsule 3   triamcinolone cream (KENALOG) 0.1 % Apply topically 2 (two) times daily.     No current facility-administered medications on file prior to visit.    BP Marland Kitchen)  160/80   Pulse 86   Temp 98 F (36.7 C) (Oral)   Ht 4\' 10"  (1.473 m)   Wt 90 lb (40.8 kg)   SpO2 96%   BMI 18.81 kg/m       Objective:   Physical Exam Vitals and nursing note reviewed.  Constitutional:      Appearance: Normal appearance.  Cardiovascular:     Rate and Rhythm: Normal rate and regular rhythm.     Pulses: Normal pulses.     Heart sounds: Normal heart sounds.  Pulmonary:     Effort: Pulmonary effort is normal.     Breath sounds: Normal breath sounds.  Musculoskeletal:        General: Normal range of motion.  Skin:    General: Skin is warm and dry.  Neurological:     General: No focal deficit present.     Mental Status: She is alert and oriented to person, place, and time.     Cranial Nerves: Cranial nerves 2-12 are intact.     Sensory: Sensation is intact.     Motor: Motor function is intact.     Coordination: Coordination is intact.     Gait: Gait is intact.     Comments: Mild word finding issues noted during conversation   Psychiatric:        Mood and Affect: Mood normal.        Behavior: Behavior normal.        Thought Content: Thought content normal.        Judgment: Judgment normal.           Assessment & Plan:   1. Depression, recurrent (HCC) (Primary) - Does not feel depressed and does not want to go on medication   2. Cognitive impairment -     06/16/2023    1:22 PM 12/05/2021    8:00 AM 07/17/2017    1:51 PM  MMSE - Mini Mental State Exam  Not completed:   --  Orientation to time 5 5   Orientation to Place 5 5   Registration 3 3   Attention/ Calculation 5 3   Recall 0 0    Language- name 2 objects 2 2   Language- repeat 1 1   Language- follow 3 step command 3 3   Language- read & follow direction 1 1   Write a sentence 1 1   Copy design 1 1   Total score 27 25    -Her MMSE was a better score than in 2023.  Reassurance given that she is likely having issues with age-related memory loss.  I am not concerned for dementia.  Encouraged her to get out of the house, interact with people, exercise, and do crossword's or puzzles or reading at home.  Jennifer Frees, NP   Time spent with patient today was 42  minutes which consisted of chart review, discussing depression and cognitive impairment, work up, treatment, answering questions and documentation.

## 2023-08-26 ENCOUNTER — Encounter: Payer: Self-pay | Admitting: Adult Health

## 2023-08-26 ENCOUNTER — Ambulatory Visit: Admitting: Adult Health

## 2023-08-26 VITALS — BP 180/100 | HR 64 | Temp 97.9°F | Ht <= 58 in | Wt 97.0 lb

## 2023-08-26 DIAGNOSIS — S63612A Unspecified sprain of right middle finger, initial encounter: Secondary | ICD-10-CM

## 2023-08-26 DIAGNOSIS — M65341 Trigger finger, right ring finger: Secondary | ICD-10-CM | POA: Diagnosis not present

## 2023-08-26 DIAGNOSIS — M65342 Trigger finger, left ring finger: Secondary | ICD-10-CM

## 2023-08-26 DIAGNOSIS — M79644 Pain in right finger(s): Secondary | ICD-10-CM

## 2023-08-26 MED ORDER — METHYLPREDNISOLONE ACETATE 40 MG/ML IJ SUSP
40.0000 mg | Freq: Once | INTRAMUSCULAR | Status: AC
  Administered 2023-08-26: 40 mg via INTRAMUSCULAR

## 2023-08-26 MED ORDER — METHYLPREDNISOLONE ACETATE 40 MG/ML IJ SUSP
40.0000 mg | Freq: Once | INTRAMUSCULAR | Status: DC
Start: 2023-08-26 — End: 2023-08-26

## 2023-08-26 MED ORDER — METHYLPREDNISOLONE ACETATE 40 MG/ML IJ SUSP
40.0000 mg | Freq: Once | INTRAMUSCULAR | Status: AC
Start: 2023-08-26 — End: 2023-08-26
  Administered 2023-08-26: 40 mg via INTRA_ARTICULAR

## 2023-08-26 NOTE — Progress Notes (Signed)
 Subjective:    Patient ID: Jennifer Donaldson, female    DOB: 1932/10/30, 89 y.o.   MRN: [id number]  HPI 89 year old female who  has a past medical history of C. difficile colitis, CAD (coronary artery disease), Dyslipidemia (12/24/2006), Hyperlipidemia, Mitral regurgitation, MITRAL REGURGITATION (08/12/2008), Osteopenia, Osteoporosis (03/01/2014), Raynaud's disease, RAYNAUD'S DISEASE (12/24/2006), and Special screening for malignant neoplasm of colon.  She presented to the office today for an acute visit.   She originally presented to the office today for complaint of of a swollen right middle finger.  She reports that she was working outside about a week and a half ago, lost her balance and fell on concrete injuring the tip of her right middle finger.  Finger was swollen and painful to touch.  She reports that this has improved since the fall, finger is no longer as swollen or painful to touch.  She does not believe that she broke her finger.  While in the office today she quired if there is anything that could be done about bilateral trigger fingers.  For quite some time she has been suffering with trigger fingers of both ring fingers.  She reports that the fingers will often get catch or lock during flexion and she will experience pain when trying to extend the affected digit.   Review of Systems See HPI   Past Medical History:  Diagnosis Date   C. difficile colitis    CAD (coronary artery disease)    Dyslipidemia 12/24/2006   Qualifier: Diagnosis of  By: Ena Harries RN, Ellen     Hyperlipidemia    Mitral regurgitation    MITRAL REGURGITATION 08/12/2008   Qualifier: Diagnosis of  By: Nadean August     Osteopenia    Osteoporosis 03/01/2014   Raynaud's disease    RAYNAUD'S DISEASE 12/24/2006   Qualifier: Diagnosis of  By: Ena Harries RN, Willetta Harpin     Special screening for malignant neoplasm of colon     Social History   Socioeconomic History   Marital status: Widowed    Spouse name: Not on  file   Number of children: Not on file   Years of education: Not on file   Highest education level: 12th grade  Occupational History   Not on file  Tobacco Use   Smoking status: Former    Current packs/day: 0.00    Average packs/day: 0.5 packs/day for 20.0 years (10.0 ttl pk-yrs)    Types: Cigarettes    Start date: 09/01/1963    Quit date: 09/01/1983    Years since quitting: 40.0   Smokeless tobacco: Never  Vaping Use   Vaping status: Never Used  Substance and Sexual Activity   Alcohol use: No    Alcohol/week: 0.0 standard drinks of alcohol   Drug use: No   Sexual activity: Yes    Birth control/protection: None    Comment: Married  Other Topics Concern   Not on file  Social History Narrative   ** Merged History Encounter **       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 4 children Grew up in [place]   Social Drivers of Health   Financial Resource Strain: Low Risk  (05/10/2022)   Overall Financial Resource Strain (CARDIA)    Difficulty of Paying Living Expenses: Not hard at all  Food Insecurity: No Food Insecurity (05/10/2022)   Hunger Vital Sign    Worried About Programme researcher, broadcasting/film/video in  the Last Year: Never true    Ran Out of Food in the Last Year: Never true  Transportation Needs: No Transportation Needs (05/10/2022)   PRAPARE - Administrator, Civil Service (Medical): No    Lack of Transportation (Non-Medical): No  Physical Activity: Insufficiently Active (05/10/2022)   Exercise Vital Sign    Days of Exercise per Week: 2 days    Minutes of Exercise per Session: 30 min  Stress: Stress Concern Present (05/10/2022)   [employer] of Occupational Health - Occupational Stress Questionnaire    Feeling of Stress : To some extent  Social Connections: Moderately Isolated (05/10/2022)   Social Connection and Isolation Panel [NHANES]    Frequency of Communication with Friends and Family: More  than three times a week    Frequency of Social Gatherings with Friends and Family: More than three times a week    Attends Religious Services: More than 4 times per year    Active Member of [employer] or Organizations: No    Attends Banker Meetings: Not on file    Marital Status: Widowed  Intimate Partner Violence: Not At Risk (02/18/2022)   Humiliation, Afraid, Rape, and Kick questionnaire    Fear of Current or Ex-Partner: No    Emotionally Abused: No    Physically Abused: No    Sexually Abused: No    Past Surgical History:  Procedure Laterality Date   BREAST EXCISIONAL BIOPSY Left    CORONARY STENT PLACEMENT     2 prior drug eluting stent placed in the LAD and right coronary artery     Family History  Problem Relation Age of Onset   CAD Brother    Stroke Father    CAD Mother    Breast cancer Neg Hx     Allergies  Allergen Reactions   Celexa  [Citalopram ]     Confusion     Codeine Sulfate     REACTION: sensitivity to    Current Outpatient Medications on File Prior to Visit  Medication Sig Dispense Refill   alendronate (FOSAMAX) 70 MG tablet Take 70 mg by mouth once a week.  11   amLODipine  (NORVASC ) 2.5 MG tablet TAKE 1 TABLET BY MOUTH EVERY DAY 90 tablet 1   atorvastatin  (LIPITOR) 20 MG tablet TAKE 1 TABLET BY MOUTH DAILY AT 6 PM. 90 tablet 2   betamethasone dipropionate 0.05 % cream Apply topically.     buPROPion  (WELLBUTRIN  XL) 150 MG 24 hr tablet TAKE 1 TABLET BY MOUTH EVERY DAY 90 tablet 0   calcium  citrate-vitamin D  (CITRACAL+D) 315-200 MG-UNIT per tablet Take 2 tablets by mouth daily.      chlorhexidine (PERIDEX) 0.12 % solution SMARTSIG:0.5 Ounce(s) Morning-Night     ezetimibe  (ZETIA ) 10 MG tablet TAKE 1 TABLET BY MOUTH EVERY DAY 90 tablet 3   levocetirizine (XYZAL) 5 MG tablet Take 5 mg by mouth at bedtime.     meloxicam  (MOBIC ) 15 MG tablet TAKE 1 (ONE) TABLET DAILY AS NEEDED FOR PAIN 90 tablet 0   metroNIDAZOLE  (METROCREAM ) 0.75 % cream Apply  topically 2 (two) times daily.     omeprazole  (PRILOSEC) 20 MG capsule TAKE 1 CAPSULE BY MOUTH EVERY DAY 90 capsule 3   triamcinolone  cream (KENALOG) 0.1 % Apply topically 2 (two) times daily.     No current facility-administered medications on file prior to visit.    BP (!) 180/100   Pulse 64   Temp 97.9 F (36.6 C) (Oral)   Ht 4'  10" (1.473 m)   Wt 97 lb (44 kg)   SpO2 99%   BMI 20.27 kg/m       Objective:   Physical Exam Vitals and nursing note reviewed.  Constitutional:      Appearance: Normal appearance.  Musculoskeletal:     Right hand: Tenderness and bony tenderness present. Decreased range of motion.     Left hand: Tenderness present. No bony tenderness. Decreased range of motion.     Comments: Snapping, catching, and locking noted to bilateral ring fingers with flexion.  She did have some mild discomfort with palpation to distal end of right middle finger. No swelling noted  Skin:    General: Skin is warm and dry.  Neurological:     General: No focal deficit present.     Mental Status: She is alert and oriented to person, place, and time.  Psychiatric:        Mood and Affect: Mood normal.        Behavior: Behavior normal.        Thought Content: Thought content normal.        Judgment: Judgment normal.        Assessment & Plan:  1. Pain of right middle finger (Primary) - No fracture noted to the DIP joint of the right middle finger.  Likely sprained her finger when she fell.  This seems to be improving.  Will forego x-rays today.  2. Trigger ring finger of left hand Verbal consent obtained. The patient was positioned comfortably, seated in a relaxed position with the hand resting on a flat surface.The left hand was thoroughly washed and scrubbed with betadine and alcohol to reduce the risk of infection. Topical anesthesia was applied over the area of the affected tendon sheath to minimize discomfort during injectio. After confirming the location of the  trigger point, the needle was carefully inserted into the A1 pulley of the left finger at the level of the metacarpophalangeal joint.  Depo Medrol  40 mg/mL was slowly injected into the tendon sheath after confirming proper placement of the needle with [aspiration technique, if applicable] to ensure no blood vessels were inadvertently punctured. The needle was withdrawn and a sterile bandage was applied to the injection site. Patient reported immediate improvement  - methylPREDNISolone  acetate (DEPO-MEDROL ) injection 40 mg  3. Trigger ring finger of right hand Verbal consent obtained. The patient was positioned comfortably, seated in a relaxed position with the hand resting on a flat surface.The right hand was thoroughly washed and scrubbed with betadine and alcohol to reduce the risk of infection. Topical anesthesia was applied over the area of the affected tendon sheath to minimize discomfort during injectio. After confirming the location of the trigger point, the needle was carefully inserted into the A1 pulley of the right finger at the level of the metacarpophalangeal joint.  Depo Medrol  40 mg/mL was slowly injected into the tendon sheath after confirming proper placement of the needle with [aspiration technique, if applicable] to ensure no blood vessels were inadvertently punctured. The needle was withdrawn and a sterile bandage was applied to the injection site. Patient reported immediate improvement - methylPREDNISolone  acetate (DEPO-MEDROL ) injection 40 mg  Andriy Sherk,NP

## 2023-09-10 ENCOUNTER — Ambulatory Visit (INDEPENDENT_AMBULATORY_CARE_PROVIDER_SITE_OTHER): Admitting: Adult Health

## 2023-09-10 ENCOUNTER — Encounter: Payer: Self-pay | Admitting: Adult Health

## 2023-09-10 VITALS — BP 138/82 | HR 79 | Temp 98.0°F | Ht <= 58 in | Wt 95.6 lb

## 2023-09-10 DIAGNOSIS — R410 Disorientation, unspecified: Secondary | ICD-10-CM

## 2023-09-10 DIAGNOSIS — R300 Dysuria: Secondary | ICD-10-CM | POA: Diagnosis not present

## 2023-09-10 NOTE — Progress Notes (Unsigned)
 Subjective:    Patient ID: Jennifer Donaldson, female    DOB: [birth year], 89 y.o.   MRN: [id number]  HPI 89 year old female who  has a past medical history of C. difficile colitis, CAD (coronary artery disease), Dyslipidemia (12/24/2006), Hyperlipidemia, Mitral regurgitation, MITRAL REGURGITATION (08/12/2008), Osteopenia, Osteoporosis (03/01/2014), Raynaud's disease, RAYNAUD'S DISEASE (12/24/2006), and Special screening for malignant neoplasm of colon.  She presents to the office today with her daughter for concern of a UTI. Her last UTI was about 6 months ago in December 2024.  Her symptoms today include that of mild confusion ( improved), lower pelvic pressure, urinary frequency and dysuria ( improved).  Her symptoms started 2 weeks ago but have been improving.  She does report drinking a lot of coffee up to 4 large glasses a day when her symptoms started but since then has reduced her coffee intake and has started drinking more water.  Her daughter feels as though the confusion has improved as well.  She has not had any fevers, chills, nausea, vomiting, or diarrhea.  She does not have any worsening low back pain.   Review of Systems See HPI   Past Medical History:  Diagnosis Date   C. difficile colitis    CAD (coronary artery disease)    Dyslipidemia 12/24/2006   Qualifier: Diagnosis of  By: Ena Harries RN, Ellen     Hyperlipidemia    Mitral regurgitation    MITRAL REGURGITATION 08/12/2008   Qualifier: Diagnosis of  By: Nadean August     Osteopenia    Osteoporosis 03/01/2014   Raynaud's disease    RAYNAUD'S DISEASE 12/24/2006   Qualifier: Diagnosis of  By: Ena Harries RN, Willetta Harpin     Special screening for malignant neoplasm of colon     Social History   Socioeconomic History   Marital status: Widowed    Spouse name: Not on file   Number of children: Not on file   Years of education: Not on file   Highest education level: 12th grade  Occupational History   Not on file  Tobacco Use    Smoking status: Former    Current packs/day: 0.00    Average packs/day: 0.5 packs/day for 20.0 years (10.0 ttl pk-yrs)    Types: Cigarettes    Start date: 09/01/1963    Quit date: 09/01/1983    Years since quitting: 40.0   Smokeless tobacco: Never  Vaping Use   Vaping status: Never Used  Substance and Sexual Activity   Alcohol use: No    Alcohol/week: 0.0 standard drinks of alcohol   Drug use: No   Sexual activity: Yes    Birth control/protection: None    Comment: Married  Other Topics Concern   Not on file  Social History Narrative   ** Merged History Encounter **       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 4 children Grew up in [place]   Social Drivers of Health   Financial Resource Strain: Low Risk  (05/10/2022)   Overall Financial Resource Strain (CARDIA)    Difficulty of Paying Living Expenses: Not hard at all  Food Insecurity: No Food Insecurity (05/10/2022)   Hunger Vital Sign    Worried About Running Out of Food in the Last Year: Never true    Ran Out of Food in the Last Year: Never true  Transportation Needs: No Transportation Needs (05/10/2022)   PRAPARE - Transportation    Lack  of Transportation (Medical): No    Lack of Transportation (Non-Medical): No  Physical Activity: Insufficiently Active (05/10/2022)   Exercise Vital Sign    Days of Exercise per Week: 2 days    Minutes of Exercise per Session: 30 min  Stress: Stress Concern Present (05/10/2022)   [employer] of Occupational Health - Occupational Stress Questionnaire    Feeling of Stress : To some extent  Social Connections: Moderately Isolated (05/10/2022)   Social Connection and Isolation Panel [NHANES]    Frequency of Communication with Friends and Family: More than three times a week    Frequency of Social Gatherings with Friends and Family: More than three times a week    Attends Religious Services: More than 4 times per  year    Active Member of [employer] or Organizations: No    Attends Banker Meetings: Not on file    Marital Status: Widowed  Intimate Partner Violence: Not At Risk (02/18/2022)   Humiliation, Afraid, Rape, and Kick questionnaire    Fear of Current or Ex-Partner: No    Emotionally Abused: No    Physically Abused: No    Sexually Abused: No    Past Surgical History:  Procedure Laterality Date   BREAST EXCISIONAL BIOPSY Left    CORONARY STENT PLACEMENT     2 prior drug eluting stent placed in the LAD and right coronary artery     Family History  Problem Relation Age of Onset   CAD Brother    Stroke Father    CAD Mother    Breast cancer Neg Hx     Allergies  Allergen Reactions   Celexa  [Citalopram ]     Confusion     Codeine Sulfate     REACTION: sensitivity to    Current Outpatient Medications on File Prior to Visit  Medication Sig Dispense Refill   alendronate (FOSAMAX) 70 MG tablet Take 70 mg by mouth once a week.  11   amLODipine  (NORVASC ) 2.5 MG tablet TAKE 1 TABLET BY MOUTH EVERY DAY 90 tablet 1   atorvastatin  (LIPITOR) 20 MG tablet TAKE 1 TABLET BY MOUTH DAILY AT 6 PM. 90 tablet 2   betamethasone dipropionate 0.05 % cream Apply topically.     buPROPion  (WELLBUTRIN  XL) 150 MG 24 hr tablet TAKE 1 TABLET BY MOUTH EVERY DAY 90 tablet 0   calcium  citrate-vitamin D  (CITRACAL+D) 315-200 MG-UNIT per tablet Take 2 tablets by mouth daily.      chlorhexidine (PERIDEX) 0.12 % solution SMARTSIG:0.5 Ounce(s) Morning-Night     ezetimibe  (ZETIA ) 10 MG tablet TAKE 1 TABLET BY MOUTH EVERY DAY 90 tablet 3   levocetirizine (XYZAL) 5 MG tablet Take 5 mg by mouth at bedtime.     meloxicam  (MOBIC ) 15 MG tablet TAKE 1 (ONE) TABLET DAILY AS NEEDED FOR PAIN 90 tablet 0   metroNIDAZOLE  (METROCREAM ) 0.75 % cream Apply topically 2 (two) times daily.     omeprazole  (PRILOSEC) 20 MG capsule TAKE 1 CAPSULE BY MOUTH EVERY DAY 90 capsule 3   triamcinolone  cream (KENALOG) 0.1 % Apply  topically 2 (two) times daily.     No current facility-administered medications on file prior to visit.    Pulse 79   Temp 98 F (36.7 C) (Oral)   Ht 4\' 10"  (1.473 m)   Wt 95 lb 9.6 oz (43.4 kg)   SpO2 97%   BMI 19.98 kg/m       Objective:   Physical Exam Vitals and nursing note reviewed.  Constitutional:  Appearance: She is well-developed.  Cardiovascular:     Rate and Rhythm: Normal rate and regular rhythm.     Pulses: Normal pulses.     Heart sounds: Normal heart sounds.  Pulmonary:     Effort: Pulmonary effort is normal.     Breath sounds: Normal breath sounds.  Abdominal:     Tenderness: There is abdominal tenderness. There is right CVA tenderness. There is no left CVA tenderness.  Musculoskeletal:        General: Normal range of motion.  Skin:    General: Skin is warm and dry.  Neurological:     General: No focal deficit present.     Mental Status: She is alert and oriented to person, place, and time.  Psychiatric:        Mood and Affect: Mood normal.        Behavior: Behavior normal.        Thought Content: Thought content normal.        Judgment: Judgment normal.        Assessment & Plan:  1. Dysuria (Primary)  - POC Urinalysis Dipstick- negative for UTI  - Will send culture due to symptoms.  Believe this is likely OAB as her symptoms are improving with adequate hydration.  Encouraged to continue to drink plenty of water throughout the day.  Will follow-up once urine culture has resulted - Culture, Urine  2. Confusion - Improving. She has mild cognitive impairment at baseline   Alto Atta, NP

## 2023-09-11 LAB — URINE CULTURE
MICRO NUMBER:: 16514167
SPECIMEN QUALITY:: ADEQUATE

## 2023-09-11 LAB — POCT URINALYSIS DIPSTICK
Bilirubin, UA: NEGATIVE
Blood, UA: NEGATIVE
Glucose, UA: NEGATIVE
Ketones, UA: NEGATIVE
Leukocytes, UA: NEGATIVE
Nitrite, UA: NEGATIVE
Protein, UA: NEGATIVE
Spec Grav, UA: 1.015 (ref 1.010–1.025)
Urobilinogen, UA: NEGATIVE U/dL — AB
pH, UA: 6.5 (ref 5.0–8.0)

## 2023-09-15 ENCOUNTER — Ambulatory Visit: Payer: Self-pay | Admitting: Adult Health

## 2023-09-28 ENCOUNTER — Telehealth: Payer: Self-pay | Admitting: Internal Medicine

## 2023-09-28 MED ORDER — ATORVASTATIN CALCIUM 20 MG PO TABS: 20.0000 mg | ORAL_TABLET | Freq: Every day | ORAL | 1 refills | Status: DC

## 2023-09-28 NOTE — Telephone Encounter (Signed)
*  STAT* If patient is at the pharmacy, call can be transferred to refill team.   1. Which medications need to be refilled? (please list name of each medication and dose if known)   atorvastatin  (LIPITOR) 20 MG tablet   2. Would you like to learn more about the convenience, safety, & potential cost savings by using the [facility]?   3. Are you open to using the [facility] (Type [facility]. ).  4. Which pharmacy/location (including street and city if local pharmacy) is medication to be sent to?  [id number] - [place], [place] - [address]   5. Do they need a 30 day or 90 day supply? 90 day  Daughter Vonita Guan) stated patient is completely out of this medication.

## 2023-09-28 NOTE — Telephone Encounter (Signed)
 RX sent to requested Pharmacy

## 2023-10-01 ENCOUNTER — Other Ambulatory Visit: Payer: Self-pay | Admitting: Adult Health

## 2023-10-01 DIAGNOSIS — M150 Primary generalized (osteo)arthritis: Secondary | ICD-10-CM

## 2023-11-24 ENCOUNTER — Other Ambulatory Visit: Payer: Self-pay | Admitting: Adult Health

## 2023-12-23 ENCOUNTER — Ambulatory Visit: Admitting: Adult Health

## 2023-12-24 ENCOUNTER — Encounter: Payer: Self-pay | Admitting: Adult Health

## 2023-12-24 ENCOUNTER — Ambulatory Visit: Admitting: Adult Health

## 2023-12-24 VITALS — BP 160/60 | HR 78 | Temp 97.8°F | Ht <= 58 in | Wt 98.6 lb

## 2023-12-24 DIAGNOSIS — F339 Major depressive disorder, recurrent, unspecified: Secondary | ICD-10-CM | POA: Diagnosis not present

## 2023-12-24 DIAGNOSIS — R4189 Other symptoms and signs involving cognitive functions and awareness: Secondary | ICD-10-CM

## 2023-12-24 NOTE — Progress Notes (Signed)
 Subjective:    Patient ID: Jennifer Donaldson, female    DOB: 11/07/1932, 89 y.o.   MRN: [id number]  HPI 89 year old female who  has a past medical history of C. difficile colitis, CAD (coronary artery disease), Dyslipidemia (12/24/2006), Hyperlipidemia, Mitral regurgitation, MITRAL REGURGITATION (08/12/2008), Osteopenia, Osteoporosis (03/01/2014), Raynaud's disease, RAYNAUD'S DISEASE (12/24/2006), and Special screening for malignant neoplasm of colon.  She presents to the office today for concern of cognitive impairment. She has been a concern of hers for quite some time. Her short term memory is concerning for her.  She will have trouble remembering grandchildren's names but she does not see them often and can usually remember their names after a short period of time. She also cannot always remember the names of roads but has no difficulty getting places. She does feel lonely since her husband passed away but does not leave the house much. She does have lunch with a friend once a week and this friend has told Jennifer Donaldson that she would take her to the [facility] with her. Jennifer Donaldson has signed up for the [facility] but has not gone.   She had an MRA of her head in January 2024 which showed   IMPRESSION: No intracranial large vessel occlusion or hemodynamically significant stenosis.       06/16/2023    1:22 PM 12/05/2021    8:00 AM 07/17/2017    1:51 PM  MMSE - Mini Mental State Exam  Not completed:   --  Orientation to time 5 5   Orientation to Place 5 5   Registration 3 3   Attention/ Calculation 5 3   Recall 0 0   Language- name 2 objects 2 2   Language- repeat 1 1   Language- follow 3 step command 3 3   Language- read & follow direction 1 1   Write a sentence 1 1   Copy design 1 1   Total score 27 25       Review of Systems See HPI   Past Medical History:  Diagnosis Date   C. difficile colitis    CAD (coronary artery disease)    Dyslipidemia 12/24/2006   Qualifier:  Diagnosis of  By: Krystal RN, Leeroy     Hyperlipidemia    Mitral regurgitation    MITRAL REGURGITATION 08/12/2008   Qualifier: Diagnosis of  By: Gwenn Grimes     Osteopenia    Osteoporosis 03/01/2014   Raynaud's disease    RAYNAUD'S DISEASE 12/24/2006   Qualifier: Diagnosis of  By: Krystal RN, Leeroy     Special screening for malignant neoplasm of colon     Social History   Socioeconomic History   Marital status: Widowed    Spouse name: Not on file   Number of children: Not on file   Years of education: Not on file   Highest education level: 12th grade  Occupational History   Not on file  Tobacco Use   Smoking status: Former    Current packs/day: 0.00    Average packs/day: 0.5 packs/day for 20.0 years (10.0 ttl pk-yrs)    Types: Cigarettes    Start date: 09/01/1963    Quit date: 09/01/1983    Years since quitting: 40.3   Smokeless tobacco: Never  Vaping Use   Vaping status: Never Used  Substance and Sexual Activity   Alcohol use: No    Alcohol/week: 0.0 standard drinks of alcohol   Drug use: No   Sexual activity: Yes  Birth control/protection: None    Comment: Married  Other Topics Concern   Not on file  Social History Narrative   ** Merged History Encounter **       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 4 children Grew up in [place]   Social Drivers of [employer]: Low Risk  (05/10/2022)   Overall Financial Resource Strain (CARDIA)    Difficulty of Paying Living Expenses: Not hard at all  Food Insecurity: No Food Insecurity (05/10/2022)   Hunger Vital Sign    Worried About Running Out of Food in the Last Year: Never true    Ran Out of Food in the Last Year: Never true  Transportation Needs: No Transportation Needs (05/10/2022)   PRAPARE - Administrator, Civil Service (Medical): No    Lack of Transportation (Non-Medical): No  Physical Activity:  Insufficiently Active (05/10/2022)   Exercise Vital Sign    Days of Exercise per Week: 2 days    Minutes of Exercise per Session: 30 min  Stress: Stress Concern Present (05/10/2022)   [employer] of Occupational Health - Occupational Stress Questionnaire    Feeling of Stress : To some extent  Social Connections: Moderately Isolated (05/10/2022)   Social Connection and Isolation Panel    Frequency of Communication with Friends and Family: More than three times a week    Frequency of Social Gatherings with Friends and Family: More than three times a week    Attends Religious Services: More than 4 times per year    Active Member of [employer] or Organizations: No    Attends Banker Meetings: Not on file    Marital Status: Widowed  Intimate Partner Violence: Not At Risk (02/18/2022)   Humiliation, Afraid, Rape, and Kick questionnaire    Fear of Current or Ex-Partner: No    Emotionally Abused: No    Physically Abused: No    Sexually Abused: No    Past Surgical History:  Procedure Laterality Date   BREAST EXCISIONAL BIOPSY Left    CORONARY STENT PLACEMENT     2 prior drug eluting stent placed in the LAD and right coronary artery     Family History  Problem Relation Age of Onset   CAD Brother    Stroke Father    CAD Mother    Breast cancer Neg Hx     Allergies  Allergen Reactions   Celexa  [Citalopram ]     Confusion     Codeine Sulfate     REACTION: sensitivity to    Current Outpatient Medications on File Prior to Visit  Medication Sig Dispense Refill   amLODipine  (NORVASC ) 2.5 MG tablet TAKE 1 TABLET BY MOUTH EVERY DAY 90 tablet 1   atorvastatin  (LIPITOR) 20 MG tablet Take 1 tablet (20 mg total) by mouth daily. 90 tablet 1   calcium  citrate-vitamin D  (CITRACAL+D) 315-200 MG-UNIT per tablet Take 2 tablets by mouth daily.      chlorhexidine (PERIDEX) 0.12 % solution SMARTSIG:0.5 Ounce(s) Morning-Night     ezetimibe  (ZETIA ) 10 MG tablet TAKE 1 TABLET BY MOUTH  EVERY DAY 90 tablet 3   meloxicam  (MOBIC ) 15 MG tablet TAKE 1 (ONE) TABLET DAILY AS NEEDED FOR PAIN 90 tablet 0   alendronate (FOSAMAX) 70 MG tablet Take 70 mg by mouth once a week. (Patient not taking: Reported on 12/24/2023)  11   No current facility-administered medications on file  prior to visit.    BP (!) 160/60   Pulse 78   Temp 97.8 F (36.6 C) (Oral)   Ht 4' 10 (1.473 m)   Wt 98 lb 9.6 oz (44.7 kg)   SpO2 97%   BMI 20.61 kg/m       Objective:   Physical Exam Vitals and nursing note reviewed.  Constitutional:      Appearance: Normal appearance.  Cardiovascular:     Rate and Rhythm: Normal rate and regular rhythm.     Pulses: Normal pulses.     Heart sounds: Normal heart sounds.  Pulmonary:     Effort: Pulmonary effort is normal.     Breath sounds: Normal breath sounds.  Skin:    General: Skin is warm and dry.  Neurological:     General: No focal deficit present.     Mental Status: She is alert and oriented to person, place, and time.  Psychiatric:        Attention and Perception: Attention and perception normal.        Mood and Affect: Mood normal.        Speech: Speech normal.        Behavior: Behavior normal.        Thought Content: Thought content normal.        Cognition and Memory: Cognition and memory normal.        Judgment: Judgment normal.           Assessment & Plan:  1. Cognitive impairment (Primary) - She likely has some age related short term cogntive decline but no concern for dementia. She is depressed and does not want any additional medication. I think getting out of the house and going to the [facility] a couple of times a week would be great for her and I encouraged her to do so.   2. Depression, recurrent (HCC)  Darleene Shape, NP  I personally spent a total of 41 minutes in the care of the patient today including preparing to see the patient, getting/reviewing separately obtained history, performing a medically appropriate  exam/evaluation, counseling and educating, and listening.

## 2023-12-29 ENCOUNTER — Other Ambulatory Visit: Payer: Self-pay | Admitting: Adult Health

## 2023-12-29 DIAGNOSIS — M150 Primary generalized (osteo)arthritis: Secondary | ICD-10-CM

## 2024-01-19 DIAGNOSIS — L309 Dermatitis, unspecified: Secondary | ICD-10-CM | POA: Diagnosis not present

## 2024-03-02 ENCOUNTER — Other Ambulatory Visit: Payer: Self-pay | Admitting: Adult Health

## 2024-03-12 ENCOUNTER — Other Ambulatory Visit: Payer: Self-pay | Admitting: Internal Medicine

## 2024-03-23 ENCOUNTER — Ambulatory Visit: Admitting: Adult Health

## 2024-03-29 ENCOUNTER — Ambulatory Visit: Admitting: Internal Medicine

## 2024-04-17 NOTE — Progress Notes (Unsigned)
 "   Cardiology Office Note   Date:  04/18/2024   ID:  Jennifer Donaldson, DOB 29-Aug-1932, MRN [id number]  PCP:  Merna Huxley, NP  Cardiologist:   Vina Gull, MD   Patinet presents for f/u of CAD     History of Present Illness: Jennifer Donaldson is a 89 y.o. female with a history of CAD   2005  PT had CP   Nuclear scan showed ischemia    2005  LHC  prox/mid 95% stenosis  95% mid RCA stensoses   Underwent PTCA/DES with Cypher stents to LAD and to RCA  2013, 2017, 2021  Lexiscan  myoview   showed no ischemia     I saw the pt in October 2024 Since seen she denies CP  Breathing is OK  NO dizziness  She is unsteady on her feet though  Has had some falls    No palpitations When weather is cold doesn't do much Is forgetful  Here with daughter   Outpatient Medications Prior to Visit  Medication Sig Dispense Refill   amLODipine  (NORVASC ) 2.5 MG tablet TAKE 1 TABLET BY MOUTH EVERY DAY 90 tablet 1   atorvastatin  (LIPITOR) 20 MG tablet Take 1 tablet (20 mg total) by mouth daily. Please keep your appointment with Dr. Gull in January to receive further refills. Thank you. 90 tablet 0   calcium  citrate-vitamin D  (CITRACAL+D) 315-200 MG-UNIT per tablet Take 2 tablets by mouth daily.      ezetimibe  (ZETIA ) 10 MG tablet TAKE 1 TABLET BY MOUTH EVERY DAY 90 tablet 3   meloxicam  (MOBIC ) 15 MG tablet TAKE 1 (ONE) TABLET DAILY AS NEEDED FOR PAIN 90 tablet 0   alendronate (FOSAMAX) 70 MG tablet Take 70 mg by mouth once a week. (Patient not taking: Reported on 12/24/2023)  11   chlorhexidine (PERIDEX) 0.12 % solution SMARTSIG:0.5 Ounce(s) Morning-Night     No facility-administered medications prior to visit.     Allergies:   Celexa  [citalopram ] and Codeine sulfate   Past Medical History:  Diagnosis Date   C. difficile colitis    CAD (coronary artery disease)    Dyslipidemia 12/24/2006   Qualifier: Diagnosis of  By: Krystal RN, Ellen     Hyperlipidemia    Mitral regurgitation    MITRAL  REGURGITATION 08/12/2008   Qualifier: Diagnosis of  By: Gwenn Grimes     Osteopenia    Osteoporosis 03/01/2014   Raynaud's disease    RAYNAUD'S DISEASE 12/24/2006   Qualifier: Diagnosis of  By: Krystal RN, Leeroy     Special screening for malignant neoplasm of colon     Past Surgical History:  Procedure Laterality Date   BREAST EXCISIONAL BIOPSY Left    CORONARY STENT PLACEMENT     2 prior drug eluting stent placed in the LAD and right coronary artery      Social History:  The patient  reports that she quit smoking about 40 years ago. Her smoking use included cigarettes. She started smoking about 60 years ago. She has a 10 pack-year smoking history. She has never used smokeless tobacco. She reports that she does not drink alcohol and does not use drugs.   Family History:  The patient's family history includes CAD in her brother and mother; Stroke in her father.    ROS:  Please see the history of present illness. All other systems are reviewed and  Negative to the above problem except as noted.    PHYSICAL EXAM: VS:  BP 130/64 (BP Location:  Left Arm, Patient Position: Sitting, Cuff Size: Normal)   Pulse 75   Ht 4' 11 (1.499 m)   Wt 94 lb 1.6 oz (42.7 kg)   SpO2 95%   BMI 19.01 kg/m   GEN  Thin 89 yo in no acute distress  HEENT: normal  Neck: JVP is normal   No bruits\ Cardiac: RRR; no murmurs Respiratory:  clear to auscultation  GI: soft, nontender  No hepatomegaly  Ext  NO LE edema     EKG:  EKG is ordered today   SR 75 bpm    Lipid Panel    Component Value Date/Time   CHOL 165 12/07/2020 1108   CHOL 166 02/16/2018 0801   TRIG 76.0 12/07/2020 1108   HDL 82.20 12/07/2020 1108   HDL 97 02/16/2018 0801   CHOLHDL 2 12/07/2020 1108   VLDL 15.2 12/07/2020 1108   LDLCALC 68 12/07/2020 1108   LDLCALC 56 02/16/2018 0801   LDLDIRECT 124.7 07/20/2012 0958      Wt Readings from Last 3 Encounters:  04/18/24 94 lb 1.6 oz (42.7 kg)  12/24/23 98 lb 9.6 oz (44.7 kg)   09/10/23 95 lb 9.6 oz (43.4 kg)      ASSESSMENT AND PLAN:  1  CAD Pt with remote intervention in 2005 to LAD and RCA  Last   Myoview  in 2021  showed no ischemia   Follow   2.  HTN  BP is well controlled on current regimen   Continue    3  HL Check lipid and Apo B today  4  Metabolic  Check A1C today   5  Balance  Pt has some balance problems   Will refer to balance rehab for PT  Follow up in 1 year   Signed, Vina Gull, MD  04/18/2024 11:29 AM     "

## 2024-04-18 ENCOUNTER — Encounter: Payer: Self-pay | Admitting: Internal Medicine

## 2024-04-18 ENCOUNTER — Ambulatory Visit: Attending: Internal Medicine | Admitting: Internal Medicine

## 2024-04-18 VITALS — BP 130/64 | HR 75 | Ht 59.0 in | Wt 94.1 lb

## 2024-04-18 DIAGNOSIS — I3489 Other nonrheumatic mitral valve disorders: Secondary | ICD-10-CM

## 2024-04-18 DIAGNOSIS — Z9181 History of falling: Secondary | ICD-10-CM | POA: Diagnosis not present

## 2024-04-18 DIAGNOSIS — I080 Rheumatic disorders of both mitral and aortic valves: Secondary | ICD-10-CM

## 2024-04-18 DIAGNOSIS — I358 Other nonrheumatic aortic valve disorders: Secondary | ICD-10-CM

## 2024-04-18 DIAGNOSIS — I2510 Atherosclerotic heart disease of native coronary artery without angina pectoris: Secondary | ICD-10-CM | POA: Diagnosis not present

## 2024-04-18 NOTE — Patient Instructions (Signed)
 Medication Instructions:  Your physician recommends that you continue on your current medications as directed. Please refer to the Current Medication list given to you today.  *If you need a refill on your cardiac medications before your next appointment, please call your pharmacy*  Lab Work: Lipid panel, Apo B, A1C, and TSH at [facility]  If you have labs (blood work) drawn today and your tests are completely normal, you will receive your results only by: MyChart Message (if you have MyChart) OR A paper copy in the mail If you have any lab test that is abnormal or we need to change your treatment, we will call you to review the results.  Testing/Procedures: Your physician has placed a referral for Balance therapy.   Follow-Up: At [facility], you and your health needs are our priority.  As part of our continuing mission to provide you with exceptional heart care, our providers are all part of one team.  This team includes your primary Cardiologist (physician) and Advanced Practice Providers or APPs (Physician Assistants and Nurse Practitioners) who all work together to provide you with the care you need, when you need it.  Your next appointment:   10 month(s)  Provider:   Vina Gull, MD

## 2024-04-19 ENCOUNTER — Ambulatory Visit: Payer: Self-pay | Admitting: Internal Medicine

## 2024-04-19 LAB — APOLIPOPROTEIN B: Apolipoprotein B: 65 mg/dL

## 2024-04-19 LAB — LIPID PANEL
Chol/HDL Ratio: 1.8 ratio (ref 0.0–4.4)
Cholesterol, Total: 175 mg/dL (ref 100–199)
HDL: 95 mg/dL
LDL Chol Calc (NIH): 67 mg/dL (ref 0–99)
Triglycerides: 68 mg/dL (ref 0–149)
VLDL Cholesterol Cal: 13 mg/dL (ref 5–40)

## 2024-04-19 LAB — HEMOGLOBIN A1C
Est. average glucose Bld gHb Est-mCnc: 128 mg/dL
Hgb A1c MFr Bld: 6.1 % — ABNORMAL HIGH (ref 4.8–5.6)

## 2024-04-19 LAB — TSH: TSH: 3.14 u[IU]/mL (ref 0.450–4.500)

## 2024-05-12 ENCOUNTER — Other Ambulatory Visit: Payer: Self-pay | Admitting: Internal Medicine

## 2024-05-31 ENCOUNTER — Ambulatory Visit: Admitting: Family Medicine

## 2024-06-27 ENCOUNTER — Ambulatory Visit: Admitting: Physical Therapy
